# Patient Record
Sex: Female | Born: 1935
Health system: Southern US, Community
[De-identification: ages and names within clinical notes are randomized; demographics above are authoritative.]

## PROBLEM LIST (undated history)

## (undated) DIAGNOSIS — D649 Anemia, unspecified: Secondary | ICD-10-CM

## (undated) DIAGNOSIS — Z8701 Personal history of pneumonia (recurrent): Secondary | ICD-10-CM

## (undated) DIAGNOSIS — N189 Chronic kidney disease, unspecified: Secondary | ICD-10-CM

## (undated) DIAGNOSIS — I2699 Other pulmonary embolism without acute cor pulmonale: Secondary | ICD-10-CM

## (undated) DIAGNOSIS — E785 Hyperlipidemia, unspecified: Secondary | ICD-10-CM

## (undated) DIAGNOSIS — R351 Nocturia: Secondary | ICD-10-CM

## (undated) DIAGNOSIS — M549 Dorsalgia, unspecified: Secondary | ICD-10-CM

## (undated) DIAGNOSIS — Z9289 Personal history of other medical treatment: Secondary | ICD-10-CM

## (undated) DIAGNOSIS — M109 Gout, unspecified: Secondary | ICD-10-CM

## (undated) DIAGNOSIS — I10 Essential (primary) hypertension: Secondary | ICD-10-CM

## (undated) DIAGNOSIS — I2510 Atherosclerotic heart disease of native coronary artery without angina pectoris: Secondary | ICD-10-CM

## (undated) DIAGNOSIS — J449 Chronic obstructive pulmonary disease, unspecified: Secondary | ICD-10-CM

## (undated) DIAGNOSIS — M1990 Unspecified osteoarthritis, unspecified site: Secondary | ICD-10-CM

## (undated) DIAGNOSIS — I219 Acute myocardial infarction, unspecified: Secondary | ICD-10-CM

## (undated) DIAGNOSIS — G8929 Other chronic pain: Secondary | ICD-10-CM

## (undated) HISTORY — DX: Other pulmonary embolism without acute cor pulmonale: I26.99

## (undated) HISTORY — DX: Atherosclerotic heart disease of native coronary artery without angina pectoris: I25.10

## (undated) HISTORY — PX: BACK SURGERY: SHX140

## (undated) HISTORY — PX: OTHER SURGICAL HISTORY: SHX169

## (undated) HISTORY — DX: Personal history of pneumonia (recurrent): Z87.01

## (undated) HISTORY — DX: Anemia, unspecified: D64.9

## (undated) HISTORY — PX: APPENDECTOMY: SHX54

## (undated) HISTORY — DX: Unspecified osteoarthritis, unspecified site: M19.90

## (undated) HISTORY — DX: Hyperlipidemia, unspecified: E78.5

---

## 1963-10-12 HISTORY — PX: ABDOMINAL HYSTERECTOMY: SHX81

## 1995-10-12 DIAGNOSIS — I219 Acute myocardial infarction, unspecified: Secondary | ICD-10-CM

## 1995-10-12 HISTORY — DX: Acute myocardial infarction, unspecified: I21.9

## 1995-10-12 HISTORY — PX: CORONARY ANGIOPLASTY: SHX604

## 1998-08-22 ENCOUNTER — Encounter: Admission: RE | Admit: 1998-08-22 | Discharge: 1998-11-20 | Payer: Self-pay | Admitting: Orthopedic Surgery

## 1998-11-28 ENCOUNTER — Encounter: Payer: Self-pay | Admitting: Orthopedic Surgery

## 1998-11-28 ENCOUNTER — Ambulatory Visit (HOSPITAL_COMMUNITY): Admission: RE | Admit: 1998-11-28 | Discharge: 1998-11-28 | Payer: Self-pay | Admitting: Orthopedic Surgery

## 2003-02-25 ENCOUNTER — Encounter: Payer: Self-pay | Admitting: *Deleted

## 2003-02-25 ENCOUNTER — Encounter (HOSPITAL_COMMUNITY): Admission: RE | Admit: 2003-02-25 | Discharge: 2003-03-27 | Payer: Self-pay | Admitting: *Deleted

## 2004-10-11 HISTORY — PX: OTHER SURGICAL HISTORY: SHX169

## 2006-03-31 ENCOUNTER — Ambulatory Visit (HOSPITAL_COMMUNITY): Admission: RE | Admit: 2006-03-31 | Discharge: 2006-03-31 | Payer: Self-pay | Admitting: Family Medicine

## 2006-08-19 ENCOUNTER — Ambulatory Visit: Payer: Self-pay | Admitting: Cardiology

## 2006-08-30 ENCOUNTER — Ambulatory Visit: Payer: Self-pay | Admitting: Cardiology

## 2006-10-11 HISTORY — PX: OTHER SURGICAL HISTORY: SHX169

## 2006-10-20 ENCOUNTER — Ambulatory Visit: Payer: Self-pay | Admitting: Cardiology

## 2006-10-21 ENCOUNTER — Encounter: Payer: Self-pay | Admitting: Cardiology

## 2006-10-28 ENCOUNTER — Encounter: Payer: Self-pay | Admitting: Cardiology

## 2007-06-08 ENCOUNTER — Encounter: Payer: Self-pay | Admitting: Cardiology

## 2007-07-11 ENCOUNTER — Encounter (INDEPENDENT_AMBULATORY_CARE_PROVIDER_SITE_OTHER): Payer: Self-pay | Admitting: General Surgery

## 2007-07-11 ENCOUNTER — Ambulatory Visit (HOSPITAL_COMMUNITY): Admission: RE | Admit: 2007-07-11 | Discharge: 2007-07-11 | Payer: Self-pay | Admitting: General Surgery

## 2007-07-21 ENCOUNTER — Encounter: Admission: RE | Admit: 2007-07-21 | Discharge: 2007-07-21 | Payer: Self-pay | Admitting: General Surgery

## 2007-08-08 ENCOUNTER — Encounter: Admission: RE | Admit: 2007-08-08 | Discharge: 2007-08-08 | Payer: Self-pay | Admitting: General Surgery

## 2007-08-31 ENCOUNTER — Ambulatory Visit: Payer: Self-pay | Admitting: Cardiology

## 2007-09-18 ENCOUNTER — Encounter: Payer: Self-pay | Admitting: Physician Assistant

## 2007-09-18 ENCOUNTER — Ambulatory Visit: Payer: Self-pay | Admitting: Cardiology

## 2007-10-10 ENCOUNTER — Ambulatory Visit: Payer: Self-pay | Admitting: Cardiology

## 2007-10-12 HISTORY — PX: OTHER SURGICAL HISTORY: SHX169

## 2007-10-27 ENCOUNTER — Ambulatory Visit (HOSPITAL_COMMUNITY): Admission: RE | Admit: 2007-10-27 | Discharge: 2007-10-27 | Payer: Self-pay | Admitting: General Surgery

## 2007-11-23 ENCOUNTER — Encounter (INDEPENDENT_AMBULATORY_CARE_PROVIDER_SITE_OTHER): Payer: Self-pay | Admitting: General Surgery

## 2007-11-23 ENCOUNTER — Inpatient Hospital Stay (HOSPITAL_COMMUNITY): Admission: RE | Admit: 2007-11-23 | Discharge: 2007-11-27 | Payer: Self-pay | Admitting: General Surgery

## 2008-03-07 ENCOUNTER — Encounter: Admission: RE | Admit: 2008-03-07 | Discharge: 2008-03-07 | Payer: Self-pay | Admitting: Otolaryngology

## 2008-03-07 ENCOUNTER — Encounter: Payer: Self-pay | Admitting: Cardiology

## 2008-04-02 ENCOUNTER — Ambulatory Visit: Payer: Self-pay | Admitting: Cardiology

## 2008-08-10 ENCOUNTER — Encounter: Payer: Self-pay | Admitting: Cardiology

## 2008-08-11 ENCOUNTER — Encounter: Payer: Self-pay | Admitting: Cardiology

## 2008-08-12 ENCOUNTER — Encounter: Payer: Self-pay | Admitting: Cardiology

## 2008-10-31 ENCOUNTER — Ambulatory Visit: Payer: Self-pay | Admitting: Cardiology

## 2008-10-31 ENCOUNTER — Encounter: Payer: Self-pay | Admitting: Physician Assistant

## 2009-05-15 ENCOUNTER — Ambulatory Visit: Payer: Self-pay | Admitting: Cardiology

## 2009-06-24 DIAGNOSIS — E785 Hyperlipidemia, unspecified: Secondary | ICD-10-CM | POA: Insufficient documentation

## 2009-06-24 DIAGNOSIS — I2510 Atherosclerotic heart disease of native coronary artery without angina pectoris: Secondary | ICD-10-CM | POA: Insufficient documentation

## 2009-08-18 ENCOUNTER — Ambulatory Visit: Payer: Self-pay | Admitting: Oncology

## 2009-08-20 ENCOUNTER — Encounter: Payer: Self-pay | Admitting: Cardiology

## 2009-08-20 LAB — CBC & DIFF AND RETIC
BASO%: 0.2 % (ref 0.0–2.0)
HGB: 9 g/dL — ABNORMAL LOW (ref 11.6–15.9)
Immature Retic Fract: 7.9 % (ref 0.00–10.70)
MCH: 30.3 pg (ref 25.1–34.0)
MCV: 96.3 fL (ref 79.5–101.0)
MONO#: 0.8 10*3/uL (ref 0.1–0.9)
NEUT#: 6.9 10*3/uL — ABNORMAL HIGH (ref 1.5–6.5)
NEUT%: 70.2 % (ref 38.4–76.8)
Platelets: 183 10*3/uL (ref 145–400)
Retic %: 2.18 % — ABNORMAL HIGH (ref 0.50–1.50)
Retic Ct Abs: 64.75 10*3/uL (ref 18.30–72.70)
lymph#: 1.9 10*3/uL (ref 0.9–3.3)

## 2009-08-21 LAB — HAPTOGLOBIN: Haptoglobin: 73 mg/dL (ref 16–200)

## 2009-08-22 LAB — COMPREHENSIVE METABOLIC PANEL
ALT: 25 U/L (ref 0–35)
AST: 28 U/L (ref 0–37)
Alkaline Phosphatase: 115 U/L (ref 39–117)
CO2: 21 mEq/L (ref 19–32)
Sodium: 141 mEq/L (ref 135–145)
Total Bilirubin: 0.4 mg/dL (ref 0.3–1.2)
Total Protein: 8 g/dL (ref 6.0–8.3)

## 2009-08-22 LAB — SPEP & IFE WITH QIG
Alpha-1-Globulin: 6.4 % — ABNORMAL HIGH (ref 2.9–4.9)
Gamma Globulin: 33.6 % — ABNORMAL HIGH (ref 11.1–18.8)
IgM, Serum: 59 mg/dL — ABNORMAL LOW (ref 60–263)

## 2009-08-22 LAB — VITAMIN B12: Vitamin B-12: 309 pg/mL (ref 211–911)

## 2009-08-22 LAB — IRON AND TIBC
%SAT: 22 % (ref 20–55)
Iron: 55 ug/dL (ref 42–145)

## 2009-09-02 ENCOUNTER — Encounter: Payer: Self-pay | Admitting: Cardiology

## 2009-10-11 HISTORY — PX: OTHER SURGICAL HISTORY: SHX169

## 2009-11-03 ENCOUNTER — Ambulatory Visit: Payer: Self-pay | Admitting: Oncology

## 2009-11-03 ENCOUNTER — Encounter: Payer: Self-pay | Admitting: Cardiology

## 2009-11-03 ENCOUNTER — Encounter (HOSPITAL_COMMUNITY): Admission: RE | Admit: 2009-11-03 | Discharge: 2010-01-16 | Payer: Self-pay | Admitting: Cardiology

## 2009-11-12 ENCOUNTER — Encounter: Payer: Self-pay | Admitting: Cardiology

## 2009-11-12 LAB — COMPREHENSIVE METABOLIC PANEL
Albumin: 3.7 g/dL (ref 3.5–5.2)
BUN: 21 mg/dL (ref 6–23)
CO2: 23 mEq/L (ref 19–32)
Creatinine, Ser: 1.11 mg/dL (ref 0.40–1.20)
Glucose, Bld: 80 mg/dL (ref 70–99)
Sodium: 141 mEq/L (ref 135–145)
Total Bilirubin: 0.3 mg/dL (ref 0.3–1.2)

## 2009-11-12 LAB — CBC WITH DIFFERENTIAL/PLATELET
BASO%: 0.4 % (ref 0.0–2.0)
HGB: 10.4 g/dL — ABNORMAL LOW (ref 11.6–15.9)
MCH: 30.4 pg (ref 25.1–34.0)
MCHC: 32.9 g/dL (ref 31.5–36.0)
MCV: 92.4 fL (ref 79.5–101.0)
MONO#: 0.6 10*3/uL (ref 0.1–0.9)
NEUT#: 6.8 10*3/uL — ABNORMAL HIGH (ref 1.5–6.5)
WBC: 9.8 10*3/uL (ref 3.9–10.3)

## 2009-11-12 LAB — IRON AND TIBC
%SAT: 26 % (ref 20–55)
UIBC: 195 ug/dL

## 2009-11-12 LAB — FERRITIN: Ferritin: 476 ng/mL — ABNORMAL HIGH (ref 10–291)

## 2010-02-09 ENCOUNTER — Ambulatory Visit: Payer: Self-pay | Admitting: Surgery

## 2010-02-09 ENCOUNTER — Encounter: Payer: Self-pay | Admitting: Cardiology

## 2010-02-19 ENCOUNTER — Encounter: Payer: Self-pay | Admitting: Cardiology

## 2010-02-27 ENCOUNTER — Encounter: Payer: Self-pay | Admitting: Cardiology

## 2010-03-02 ENCOUNTER — Ambulatory Visit: Payer: Self-pay | Admitting: Oncology

## 2010-03-03 ENCOUNTER — Inpatient Hospital Stay (HOSPITAL_COMMUNITY): Admission: RE | Admit: 2010-03-03 | Discharge: 2010-03-06 | Payer: Self-pay | Admitting: Orthopaedic Surgery

## 2010-03-03 ENCOUNTER — Encounter (INDEPENDENT_AMBULATORY_CARE_PROVIDER_SITE_OTHER): Payer: Self-pay | Admitting: Orthopaedic Surgery

## 2010-03-03 ENCOUNTER — Encounter: Payer: Self-pay | Admitting: Cardiology

## 2010-03-04 ENCOUNTER — Encounter: Payer: Self-pay | Admitting: Cardiology

## 2010-03-05 ENCOUNTER — Encounter: Payer: Self-pay | Admitting: Cardiology

## 2010-03-06 ENCOUNTER — Encounter: Payer: Self-pay | Admitting: Cardiology

## 2010-06-26 ENCOUNTER — Ambulatory Visit: Payer: Self-pay | Admitting: Cardiology

## 2010-06-26 DIAGNOSIS — D649 Anemia, unspecified: Secondary | ICD-10-CM | POA: Insufficient documentation

## 2010-06-26 DIAGNOSIS — D472 Monoclonal gammopathy: Secondary | ICD-10-CM | POA: Insufficient documentation

## 2010-06-29 ENCOUNTER — Encounter: Payer: Self-pay | Admitting: Cardiology

## 2010-09-16 ENCOUNTER — Encounter
Admission: RE | Admit: 2010-09-16 | Discharge: 2010-09-16 | Payer: Self-pay | Source: Home / Self Care | Admitting: Cardiology

## 2010-09-30 ENCOUNTER — Encounter
Admission: RE | Admit: 2010-09-30 | Discharge: 2010-09-30 | Payer: Self-pay | Source: Home / Self Care | Attending: Cardiology | Admitting: Cardiology

## 2010-10-31 ENCOUNTER — Encounter: Payer: Self-pay | Admitting: Family Medicine

## 2010-11-10 NOTE — Letter (Signed)
Summary: Internal Correspondence/ [place]  Internal Correspondence/ [facility]   Imported By: Bartholomew Boards 11/24/2009 16:33:49  _____________________________________________________________________  External Attachment:    Type:   Image     Comment:   External Document

## 2010-11-10 NOTE — Letter (Signed)
Summary: Discharge Summary  Discharge Summary   Imported By: Bartholomew Boards 06/26/2010 10:09:29  _____________________________________________________________________  External Attachment:    Type:   Image     Comment:   External Document

## 2010-11-10 NOTE — Op Note (Signed)
Summary: Operative Report  Operative Report   Imported By: Bartholomew Boards 06/26/2010 10:06:14  _____________________________________________________________________  External Attachment:    Type:   Image     Comment:   External Document

## 2010-11-10 NOTE — Assessment & Plan Note (Signed)
Summary: 1 YR FU AUG REMINDER  Medications Added METOPROLOL SUCCINATE 50 MG XR24H-TAB (METOPROLOL SUCCINATE) Take 1 tablet by mouth once a day LISINOPRIL 40 MG TABS (LISINOPRIL) Take 1 tablet by mouth once a day PLAVIX 75 MG TABS (CLOPIDOGREL BISULFATE) Take 1 tablet by mouth once a day HYDROCHLOROTHIAZIDE 12.5 MG CAPS (HYDROCHLOROTHIAZIDE) Take 1 tablet by mouth once a day TRAMADOL HCL 50 MG TABS (TRAMADOL HCL) Take 1 tablet by mouth four times a day as needed      Allergies Added: NKDA  Visit Type:  Follow-up Primary [place]:  Dr. Montez Morita Lovett Calender)   History of Present Illness: the patient is a 77 year old African-American female with a history of recurrent lower extremity DVT, status post IVC filter and prior Coumadin therapy.  Recently Coumadin was discontinued due to a history of hematuria or melena.  She has a history of coronary artery disease and is status post stent placement several years ago.  She had a stress test in 2008 which was negative for ischemia with an ejection fraction of 40%.  Recently, she underwent knee replacement secondary to severe osteoarthritis.  The Cardiolite stress study was ordered she was found to have an inferolateral wall defect with hypokinesis which appear to be fixed and an ejection fraction of 48%.  The patient underwent the surgery without any cardiovascular complications.  Her hospital course was however complicated by acute renal failure.  She also apparently did not receive a GI workup.  She is also followed by hematology for full multifactorial anemia associated with a monoclonal protein.  She is felt to have monoclonal gammopathy of unknown significance although her anemia and recent renal insufficiency appears concerning for a plasma cell disorder.  Nevertheless she is followed by hematology.  From a cardiac standpoint she appears to be stable.  She denies any chest pain or heart failure symptoms.  She denies any presyncope or syncope.  She reports  no pain from her recent knee surgery.  The patient is chronically overweight.  Preventive Screening-Counseling & Management  Alcohol-Tobacco     Smoking Status: quit     Year Quit: 1990  Current Medications (verified): 1)  Metoprolol Succinate 50 Mg Xr24h-Tab (Metoprolol Succinate) .... Take 1 Tablet By Mouth Once A Day 2)  Crestor 10 Mg Tabs (Rosuvastatin Calcium) .... Take 1 Tablet By Mouth Once A Day 3)  Icaps Mv  Tabs (Multiple Vitamins-Minerals) .... Take 1 Tablet By Mouth Once A Day 4)  Multivitamins  Tabs (Multiple Vitamin) .... Take 1 Tablet By Mouth Once A Day 5)  Nitrostat 0.4 Mg Subl (Nitroglycerin) .... Use As Directed For Chest Pain 6)  Lisinopril 40 Mg Tabs (Lisinopril) .... Take 1 Tablet By Mouth Once A Day 7)  Plavix 75 Mg Tabs (Clopidogrel Bisulfate) .... Take 1 Tablet By Mouth Once A Day 8)  Hydrochlorothiazide 12.5 Mg Caps (Hydrochlorothiazide) .... Take 1 Tablet By Mouth Once A Day 9)  Tramadol Hcl 50 Mg Tabs (Tramadol Hcl) .... Take 1 Tablet By Mouth Four Times A Day As Needed  Allergies (verified): No Known Drug Allergies  Comments:  Nurse/Medical Assistant: The patient's medication bottles and allergies were reviewed with the patient and were updated in the Medication and Allergy Lists.  Past History:  Past Medical History: [id number] (ICD-272.4) CAD, NATIVE VESSEL (ICD-414.01) Status post anemia requiring transfusion. questionable multifactorial anemia but with monoclonal protein. Status post renal insufficiency, felt to be secondary to prerenal azotemia. History of left ventricular dysfunction History of recurrent pulmonary emboliand DVT., status-post IVC  filter.Coumadin discontinued. Cardiolite study 2008 negative for ischemia with an ejection fraction of 48%. Myoview 2011 inferolateral wall infarct/fixed defect ejection fraction 48% no ischemia severe osteoarthritis status post knee replacement 2011 history of hematuria and melena  Social  History: Smoking Status:  quit  Review of Systems       The patient complains of fatigue, joint pain, and leg swelling.  The patient denies malaise, fever, weight gain/loss, vision loss, decreased hearing, hoarseness, chest pain, palpitations, shortness of breath, prolonged cough, wheezing, sleep apnea, coughing up blood, abdominal pain, blood in stool, nausea, vomiting, diarrhea, heartburn, incontinence, blood in urine, muscle weakness, rash, skin lesions, headache, fainting, dizziness, depression, anxiety, enlarged lymph nodes, easy bruising or bleeding, and environmental allergies.    Vital Signs:  Patient profile:   77 year old female Height:      61 inches Weight:      213 pounds BMI:     40.39 Pulse rate:   67 / minute BP sitting:   133 / 84  (left arm) Cuff size:   large  Vitals Entered By: Georgina Peer (June 26, 2010 10:48 AM)  Nutrition Counseling: Patient's BMI is greater than 25 and therefore counseled on weight management options.  Physical Exam  Additional Exam:  General: Well-developed, well-nourished in no distress, overweight head: Normocephalic and atraumatic eyes PERRLA/EOMI intact, conjunctiva and lids normal nose: No deformity or lesions mouth normal dentition, normal posterior pharynx neck: Supple, no JVD.  No masses, thyromegaly or abnormal cervical nodes lungs: Normal breath sounds bilaterally without wheezing.  Normal percussion heart: regular rate and rhythm with normal S1 and S2, no S3 or S4.  PMI is normal.  No pathological murmurs abdomen: Normal bowel sounds, abdomen is soft and nontender without masses, organomegaly or hernias noted.  No hepatosplenomegaly musculoskeletal: Back normal, normal gait muscle strength and tone normal pulsus: Pulse is normal in all 4 extremities Extremities: swelling of both knees status post right knee surgery. neurologic: Alert and oriented x 3 skin: Intact without lesions or rashes cervical nodes: No significant  adenopathy psychologic: Normal affect    Impression & Recommendations:  Problem # 1:  CAD, NATIVE VESSEL (ICD-414.01) the patient denies any recurrent chest pain.  She status-post stent placement several years ago.  She had a recent Myoview which demonstrated fixed defect and an ejection fraction of 40% but no ischemia.  Continue medical therapy. Her updated medication list for this problem includes:    Metoprolol Succinate 50 Mg Xr24h-tab (Metoprolol succinate) .Marland Kitchen... Take 1 tablet by mouth once a day    Nitrostat 0.4 Mg Subl (Nitroglycerin) ..... Use as directed for chest pain    Lisinopril 40 Mg Tabs (Lisinopril) .Marland Kitchen... Take 1 tablet by mouth once a day    Plavix 75 Mg Tabs (Clopidogrel bisulfate) .Marland Kitchen... Take 1 tablet by mouth once a day  Orders: T-Basic Metabolic Panel (99991111) T-CBC No Diff [id number]) T-Ferritin [id number]) Alric Quan [id number]) T- * Misc. Laboratory test [phone])  Problem # 2:  ANEMIA (ICD-285.9) permit the patient was taken off Coumadin secondary to hematuria melena she has been started on Plavix although I will see a clear indication for this.  Will check a CBC, renal function ferritin and iron saturation. Orders: T-Basic Metabolic Panel (99991111) T-CBC No Diff [id number]) T-Ferritin [id number]) Alric Quan [id number]) T- * Misc. Laboratory test [phone])  Problem # 3:  HYPERLIPIDEMIA-MIXED (ICD-272.4) followed by the patient's primary care physician. Her updated medication list for this problem includes:    Crestor 10 Mg Tabs (Rosuvastatin  calcium) .Marland Kitchen... Take 1 tablet by mouth once a day  Problem # 4:  MONOCLONAL GAMMOPATHY (ICD-273.1)  Patient Instructions: 1)  Labs:  CBC, BMET, Ferritin, Iron, Iron Saturation 2)  Follow up in  6 months

## 2010-11-10 NOTE — Consult Note (Signed)
Summary: Consultation Report  Consultation Report   Imported By: Bartholomew Boards 06/26/2010 10:07:51  _____________________________________________________________________  External Attachment:    Type:   Image     Comment:   External Document

## 2010-12-17 ENCOUNTER — Encounter: Payer: Self-pay | Admitting: *Deleted

## 2010-12-28 LAB — BASIC METABOLIC PANEL
BUN: 29 mg/dL — ABNORMAL HIGH (ref 6–23)
BUN: 29 mg/dL — ABNORMAL HIGH (ref 6–23)
BUN: 34 mg/dL — ABNORMAL HIGH (ref 6–23)
CO2: 23 mEq/L (ref 19–32)
CO2: 23 mEq/L (ref 19–32)
CO2: 24 mEq/L (ref 19–32)
CO2: 24 mEq/L (ref 19–32)
Chloride: 105 mEq/L (ref 96–112)
Chloride: 106 mEq/L (ref 96–112)
Chloride: 107 mEq/L (ref 96–112)
Chloride: 107 mEq/L (ref 96–112)
Chloride: 108 mEq/L (ref 96–112)
Creatinine, Ser: 1.48 mg/dL — ABNORMAL HIGH (ref 0.4–1.2)
GFR calc Af Amer: 20 mL/min — ABNORMAL LOW (ref >60)
Glucose, Bld: 105 mg/dL — ABNORMAL HIGH (ref 70–99)
Glucose, Bld: 113 mg/dL — ABNORMAL HIGH (ref 70–99)
Glucose, Bld: 140 mg/dL — ABNORMAL HIGH (ref 70–99)
Potassium: 4.8 mEq/L (ref 3.5–5.1)
Potassium: 5.2 mEq/L — ABNORMAL HIGH (ref 3.5–5.1)
Potassium: 5.2 mEq/L — ABNORMAL HIGH (ref 3.5–5.1)
Sodium: 135 mEq/L (ref 135–145)
Sodium: 137 mEq/L (ref 135–145)

## 2010-12-28 LAB — COMPREHENSIVE METABOLIC PANEL
ALT: 19 U/L (ref 0–35)
AST: 24 U/L (ref 0–37)
Alkaline Phosphatase: 118 U/L — ABNORMAL HIGH (ref 39–117)
CO2: 25 mEq/L (ref 19–32)
Calcium: 8.9 mg/dL (ref 8.4–10.5)
GFR calc Af Amer: 52 mL/min — ABNORMAL LOW (ref >60)
GFR calc non Af Amer: 43 mL/min — ABNORMAL LOW (ref >60)
Glucose, Bld: 93 mg/dL (ref 70–99)
Potassium: 4.3 mEq/L (ref 3.5–5.1)
Sodium: 138 mEq/L (ref 135–145)
Total Protein: 7.5 g/dL (ref 6.0–8.3)

## 2010-12-28 LAB — URINALYSIS, ROUTINE W REFLEX MICROSCOPIC
Bilirubin Urine: NEGATIVE
Hgb urine dipstick: NEGATIVE
Ketones, ur: NEGATIVE mg/dL
Nitrite: NEGATIVE
Nitrite: NEGATIVE
Protein, ur: NEGATIVE mg/dL
Protein, ur: NEGATIVE mg/dL
Specific Gravity, Urine: 1.017 (ref 1.005–1.030)
Urobilinogen, UA: 0.2 mg/dL (ref 0.0–1.0)
Urobilinogen, UA: 0.2 mg/dL (ref 0.0–1.0)
pH: 5.5 (ref 5.0–8.0)

## 2010-12-28 LAB — URINE CULTURE: Colony Count: 30000

## 2010-12-28 LAB — CBC
HCT: 24.8 % — ABNORMAL LOW (ref 36.0–46.0)
HCT: 29.9 % — ABNORMAL LOW (ref 36.0–46.0)
Hemoglobin: 11 g/dL — ABNORMAL LOW (ref 12.0–15.0)
MCHC: 33.5 g/dL (ref 30.0–36.0)
MCHC: 33.7 g/dL (ref 30.0–36.0)
MCHC: 34 g/dL (ref 30.0–36.0)
MCHC: 34.2 g/dL (ref 30.0–36.0)
MCV: 93.9 fL (ref 78.0–100.0)
MCV: 94 fL (ref 78.0–100.0)
MCV: 95.6 fL (ref 78.0–100.0)
Platelets: 123 10*3/uL — ABNORMAL LOW (ref 150–400)
Platelets: 134 10*3/uL — ABNORMAL LOW (ref 150–400)
RBC: 2.59 MIL/uL — ABNORMAL LOW (ref 3.87–5.11)
RBC: 3.39 MIL/uL — ABNORMAL LOW (ref 3.87–5.11)
RDW: 15.2 % (ref 11.5–15.5)
WBC: 12 10*3/uL — ABNORMAL HIGH (ref 4.0–10.5)
WBC: 13.9 10*3/uL — ABNORMAL HIGH (ref 4.0–10.5)
WBC: 14.5 10*3/uL — ABNORMAL HIGH (ref 4.0–10.5)
WBC: 14.9 10*3/uL — ABNORMAL HIGH (ref 4.0–10.5)

## 2010-12-28 LAB — DIFFERENTIAL
Basophils Absolute: 0 10*3/uL (ref 0.0–0.1)
Basophils Relative: 0 % (ref 0–1)
Lymphocytes Relative: 24 % (ref 12–46)
Monocytes Absolute: 0.8 10*3/uL (ref 0.1–1.0)
Neutro Abs: 7.8 10*3/uL — ABNORMAL HIGH (ref 1.7–7.7)
Neutrophils Relative %: 65 % (ref 43–77)

## 2010-12-28 LAB — TYPE AND SCREEN
ABO/RH(D): O POS
Antibody Screen: NEGATIVE

## 2010-12-28 LAB — APTT: aPTT: 31 seconds (ref 24–37)

## 2010-12-28 LAB — PROTIME-INR
INR: 1.14 (ref 0.00–1.49)
Prothrombin Time: 31 seconds — ABNORMAL HIGH (ref 11.6–15.2)

## 2010-12-28 LAB — CULTURE, BLOOD (ROUTINE X 2)

## 2010-12-28 LAB — ABO/RH: ABO/RH(D): O POS

## 2011-01-05 ENCOUNTER — Ambulatory Visit (INDEPENDENT_AMBULATORY_CARE_PROVIDER_SITE_OTHER): Payer: PRIVATE HEALTH INSURANCE | Admitting: Cardiology

## 2011-01-05 ENCOUNTER — Encounter: Payer: Self-pay | Admitting: Cardiology

## 2011-01-05 VITALS — BP 121/82 | HR 83 | Ht 61.0 in | Wt 226.0 lb

## 2011-01-05 DIAGNOSIS — D472 Monoclonal gammopathy: Secondary | ICD-10-CM

## 2011-01-05 DIAGNOSIS — I10 Essential (primary) hypertension: Secondary | ICD-10-CM

## 2011-01-05 DIAGNOSIS — Z86711 Personal history of pulmonary embolism: Secondary | ICD-10-CM | POA: Insufficient documentation

## 2011-01-05 DIAGNOSIS — D649 Anemia, unspecified: Secondary | ICD-10-CM

## 2011-01-05 DIAGNOSIS — I2699 Other pulmonary embolism without acute cor pulmonale: Secondary | ICD-10-CM

## 2011-01-05 DIAGNOSIS — G589 Mononeuropathy, unspecified: Secondary | ICD-10-CM

## 2011-01-05 DIAGNOSIS — I2510 Atherosclerotic heart disease of native coronary artery without angina pectoris: Secondary | ICD-10-CM

## 2011-01-05 MED ORDER — METOPROLOL SUCCINATE ER 50 MG PO TB24: 50.0000 mg | ORAL_TABLET | Freq: Every day | ORAL | Status: DC

## 2011-01-05 NOTE — Assessment & Plan Note (Signed)
Coronary artery disease, status post stent placement several years ago patient not taking aspirin but on Plavix. Myoview 2011 and inferolateral wall infarct/fixed defect ejection fraction oriented percent no ischemia. Continue current medical therapy.

## 2011-01-05 NOTE — Assessment & Plan Note (Signed)
Patient had difficult control with Coumadin of her INR.  Plavix substituted as an alternative although this is not really indicated as I explained to her.  However I will not discontinue it given her prior history of stent placement and the fact that she's not taken aspirin.  In the future once approval has comes from the FDA she can use rivaroxaban for DVT prophylaxis.

## 2011-01-05 NOTE — Progress Notes (Signed)
HPI  the patient is a 77 year old African-American female with a history of recurrent lower extremity DVT, status post IVC filter and prior Coumadin therapy.  Recently Coumadin was discontinued due to a history of hematuria or melena.  She was taken off Coumadin in part because of bleeding but also in large part because her INR levels were difficult to control.  She was changed to Plavix although I told the patient that this does not provide any prevention against deep venous thrombosis.  She does not take aspirin.  She has a history of coronary artery disease and is status post stent placement several years ago.  She had a stress test in 2008 which was negative for ischemia with an ejection fraction of 40 The patient underwent knee surgery last year and a Cardiolite study was ordered in 2011.Marland Kitchen  She was found to have an inferolateral wall defect with hypokinesis which appear to be fixed and an ejection fraction of 40%.  The patient underwent the surgery without any cardiovascular complications.  Possible course however was complicated by acute renal failure.  She is followed by hematology for multifactorial anemia associated with monoclonal protein.  She has a monoclonal gammopathy of unknown significance although her anemia and recent renal insufficiency appear concerning for plasma cell disorder.  Several months ago she had iron studies done which showed anemia of chronic disease but no definite iron deficiency anemia.  I requested to have a renal function and CBC monitored by her primary care physician During her last office visit she was stable from a cardiovascular perspective. The patient reports no chest pain.  She reports no palpitations or syncope.  She has been stable from a cardiovascular perspective.  She does have chronic dyspnea however she attributes this to her weight and deconditioning.  She also has difficulty walking due to lower back pain which improves with sitting and pains that are  consistent with sciatica and spinal stenosis.  Apparently the patient needs a laminectomy.  No Known Allergies  Current Outpatient Prescriptions on File Prior to Visit  Medication Sig Dispense Refill  . clopidogrel (PLAVIX) 75 MG tablet Take 75 mg by mouth daily.        . hydrochlorothiazide (,MICROZIDE/HYDRODIURIL,) 12.5 MG capsule Take 12.5 mg by mouth daily.        Marland Kitchen lisinopril (PRINIVIL,ZESTRIL) 40 MG tablet Take 40 mg by mouth daily.        . Multiple Vitamin (MULTIVITAMIN) tablet Take 1 tablet by mouth daily.        . nitroGLYCERIN (NITROSTAT) 0.4 MG SL tablet Place 0.4 mg under the tongue every 5 (five) minutes as needed.        . rosuvastatin (CRESTOR) 10 MG tablet Take 10 mg by mouth daily.        Marland Kitchen DISCONTD: metoprolol (TOPROL-XL) 50 MG 24 hr tablet Take 50 mg by mouth daily.        Marland Kitchen DISCONTD: Multiple Vitamins-Minerals (ICAPS MV PO) Take one by mouth daily        . DISCONTD: traMADol (ULTRAM) 50 MG tablet Take 50 mg by mouth every 6 (six) hours as needed.          Past Medical History  Diagnosis Date  . Hyperlipidemia   . Coronary artery disease   . Anemia, unspecified     S/P TRANSFUSION,? MULTIFACTORIAL ANEMIA BUT WITH MONOCLONAL PROTEIN  . Renal insufficiency     S/P RENAL INSUFFICIENCY,FELT TO BE SECONDARY TO PRERENAL AZOTEMIA  . Left ventricular dysfunction   .  Pulmonary embolism     H/O RECURRENT PE AND DVT,S/P IVC FILTER. COUMADIN DISCONTINUED  . Osteoarthritis     SEVERE OA S/P KNEE REPLACEMENT 2011  . Hematuria     H/O  . Melena     H/O    Past Surgical History  Procedure Date  . Cardiolite 2008    CARDIOLITE STUDY NEGATIVE FOR ISCHEMIA WITH EF 48%  . Nm myoview ltd 2011    INTEROLATERAL WALL INFARCT/FIXED DEFECT EF 48% NO ISCHEMIA    No family history on file.  History   Social History  . Marital Status: Widowed    Spouse Name: N/A    Number of Children: N/A  . Years of Education: N/A   Occupational History  . Not on file.   Social  History Main Topics  . Smoking status: Former Smoker    Quit date: 10/11/1988  . Smokeless tobacco: Not on file  . Alcohol Use: No  . Drug Use: No  . Sexually Active: Not on file   Other Topics Concern  . Not on file   Social History Narrative  . No narrative on file   Review of systems:Pertinent positives as outlined above. The remainder of 18 .review of systems is otherwise within normal limits  PHYSICAL EXAM BP 121/82  Pulse 83  Ht 5\' 1"  (1.549 m)  Wt 226 lb (102.513 kg)  BMI 42.70 kg/m2  General: Well-developed, well-nourished in no distress Head: Normocephalic and atraumatic Eyes:PERRLA/EOMI intact, conjunctiva and lids normal Ears: No deformity or lesions Mouth:normal dentition, normal posterior pharynx Neck: Supple, no JVD.  No masses, thyromegaly or abnormal cervical nodes Lungs: Normal breath sounds bilaterally without wheezing.  Normal percussion Cardiac: regular rate and rhythm with normal S1 and S2, no S3 or S4.  PMI is normal.  No pathological murmurs Abdomen: Normal bowel sounds, abdomen is soft and nontender without masses, organomegaly or hernias noted.  No hepatosplenomegaly MSK: Back normal, normal gait muscle strength and tone normal Vascular: Pulse is normal in all 4 extremities Extremities: No peripheral pitting edema Neurologic: Alert and oriented x 3 Skin: Intact without lesions or rashes Lymphatics: No significant adenopathyPsychologic: Normal affect  ECG:  ASSESSMENT AND PLAN

## 2011-01-05 NOTE — Assessment & Plan Note (Signed)
back pain with spinal stenosis/foraminal narrowing: The patient needs a laminectomy.  She needs no additional cardiac workup.  She stable and she should be at low risk for complications.  I have recommended that she make an appointment with Dr. Glenna Fellows.  Plavix can be safely discontinued a week before surgery if patient is scheduled for this procedure.

## 2011-01-05 NOTE — Patient Instructions (Signed)
The current medical regimen is effective;  continue present plan and medications.  

## 2011-02-23 NOTE — Assessment & Plan Note (Signed)
[facility]                          [facility] OFFICE NOTE   Alison [name], Alison [name]                  MRN:          [id number]  DATE:04/02/2008                            DOB:          [birth year]    HISTORY OF PRESENT ILLNESS:  The patient is a 77 year old female with  history of coronary artery disease who underwent recent renal cyst  removal.  The patient has a history of DVT and underwent placement of a  prophylactic IVC filter prior to surgery.  The patient indeed had a  prior history of large bilateral subtle pulmonary embolism in January  2008.  Currently, denies any short of breath or chest pain.  She has  recovered well from her surgery.   MEDICATIONS:  1. Crestor 10 mg p.o. daily.  2. Lisinopril/hydrochlorothiazide 20/25 mg p.o. daily.  3. Aspirin 81 mg p.o. daily.  4. Toprol-XL 50 mg p.o. daily.  5. Propoxyphene/APAP.   PHYSICAL EXAMINATION:  VITAL SIGNS:  Blood pressure 132/78 and heart  rate 72.  GENERAL:  A well-nourished African-American female in no apparent  distress.  HEENT:  Pupils are equal.  Conjunctivae clear.  NECK:  Supple.  No carotid upstroke.  No carotid bruits.  LUNGS:  Clear breath sounds bilaterally.  HEART:  Regular rate and rhythm.  Normal S1 and S2.  ABDOMEN:  Soft.  EXTREMITIES:  No cyanosis, clubbing, or edema.  NEURO:  The patient is alert and oriented and grossly nonfocal.   PROBLEMS:  1. History of adrenal cystectomy.  2. History of recurrent deep venous thrombosis, status post      prophylactic inferior vena cava filter implantation.  3. Multivessel coronary artery disease.  4. Nonischemic Cardiolite.  5. Normal left ventricular function.  6. History of mild mitral regurgitation.  7. Remote tobacco use.  8. Obesity.   PLAN:  1. The patient's EKG was reviewed and is within normal limits.  There      are no acute changes.  2. From a cardiac perspective, she seems to be doing well.  We made  no      change in her      medical regimen.  3. The patient can follow up with [place] in 6 months.     Ernestine Mcmurray, MD,FACC  Electronically Signed    GED/MedQ  DD: 04/02/2008  DT: 04/03/2008  Job #: [id number]

## 2011-02-23 NOTE — Assessment & Plan Note (Signed)
OFFICE VISIT   LATICE, SHELLENBERGER  DOB:  03/03/1931                                       02/09/2010  CHART#:[id number]   REASON FOR VISIT:  Clearance for knee replacement.   HISTORY:  This is a 77 year old female with chronic right knee pain who  is being scheduled for a right knee replacement.  She has a history of  DVT x2, both of which resulted in a PE.  This was in 2008 and 2009.  She  has had an IVC filter placed, which remains in place.  She has been on  Coumadin in the past; however, this was forced to be stopped secondary  to a GI bleed.  She is now taking Plavix.  She comes in today for a  vascular clearance for her operation.   REVIEW OF SYSTEMS:  GENERAL:  Negative for fevers, chills weight gain,  weight loss.  CARDIAC:  Positive for shortness of breath with exertion.  PULMONARY:  Negative.  GI:  Negative, except for bleeding with Coumadin.  GU:  Negative.  VASCULAR:  Negative.  NEUROLOGIC:  Negative.  MUSCULOSKELETAL:  Positive for pain in her right knee.  PSYCH:  Negative.  ENT:  Right ear hearing loss.  HEMATOLOGIC:  Negative.  SKIN:  Negative.   PAST MEDICAL HISTORY:  Hypertension, history of an MI, history of blood  clot, right knee pain.   PAST SURGICAL HISTORY:  Coronary stents, adrenalectomy, partial  hysterectomy, removal of cyst in her back and her breasts.   FAMILY HISTORY:  Negative for cardiovascular disease at an early age.   SOCIAL HISTORY:  She is widowed with 6 children.  She is a retired Biomedical scientist.  Does not smoke.  Has a history of smoking.   PHYSICAL EXAMINATION:  Heart rate 79, blood pressure 140/83, O2  saturations are 99%.  General:  She is well-appearing, in no distress.  Cardiovascular:  Regular rate and rhythm.  Lungs are clear bilaterally.  Her abdomen is obese.  Musculoskeletal:  Without major deformities.  Skin is without rash.  Extremities:  She has a palpable right dorsalis  pedis pulse.  Minimal edema.   Neurologic:  No numbness and no motor  deficits.   DIAGNOSTIC STUDIES:  Ultrasound was performed and independently  reviewed.  This reveals the right leg to be free of venous thrombus.  There is evidence of chronic thrombus on the left in the common femoral,  superficial femoral and popliteal veins.  Ankle-brachial indices are  __________ on the right and 1.0 on the left.   ASSESSMENT/PLAN:  Preoperative clearance for orthopedic surgery.   PLAN:  I think the patient will do well with her upcoming operation.  She has very minimal atherosclerotic changes to the arteries in her  right leg.  She will tolerate tourniquet application without incident.  With regards to her DVT, this was in her left leg.  She has a filter in  place.  I do not think this will be an issue.  Please call if you have  any questions.     Eldridge Abrahams, MD  Electronically Signed   VWB/MEDQ  D:  02/09/2010  T:  02/10/2010  Job:  [id number]   cc:   Dr. Durward Fortes

## 2011-02-23 NOTE — Assessment & Plan Note (Signed)
[facility]                          [facility]   Alison [name], Alison [name]                  MRN:          [id number]  DATE:08/31/2007                            DOB:          29-Jun-1931    PRIMARY CARDIOLOGIST:  Dr. Terald Sleeper.   REASON FOR VISIT:  Preoperative clearance.   Ms. Volk returns to our clinic after a previous visit here in  November of last year, at which time she presented to establish with our  [facility].  She was previously followed by Dr. Mar Daring in our  [facility], and has longstanding history of coronary artery  disease, as previously outlined.   At the time of her last visit here, I referred her for a 2-D  echocardiogram for reassessment of left ventricular function.  This  showed preserved LVF (EF 55%) with mild mitral regurgitation.  I also  adjusted her medication regimen with resumption of beta blocker, which  she had been on in the past, at the low dose of 25 daily.  We also cut  back on her aspirin to 81 daily, and checked a fasting lipid profile  revealing excellent control with LDL of 55 and HDL of 63.   I am informed today by her that she was subsequently hospitalized here  at [facility] in January with complaint of shortness of breath, and was  subsequently diagnosed with pulmonary embolus and left popliteal vein  DVT.  A 2-D echo was done, reviewed by Dr. Domenic Polite, showing normal LVF  (55%/60%) with inferior/posterior akinesis, and trace mitral  regurgitation.  There was moderate RV enlargement with decreased  contraction, and evidence of increased right-sided pressures.  Of note,  patient did have an elevated D-dimer at that time, as well.   Patient was discharged on Coumadin, which apparently was stopped this  past August.  Of note, however, she apparently was not on aspirin over  that same time frame.  She has also since been taken off Toprol.   Clinically, she denies any interim  development of anterior chest  discomfort.  She continues to have chronic exertional dyspnea, which has  remained stable.  She is limited in her ambulation with severe knee  arthritis.   Patient was recently found to have a 10-inch cyst on one of her adrenal  glands, which was an incidental finding by Dr. Excell Seltzer as she  was undergoing further evaluation of a breast cyst.  The latter  apparently proved to benign, but patient is awaiting clearance to  undergo surgical resection of the adrenal cyst.  Surgery has been  planned for this coming January.   Electrocardiogram today reveals NSR at 93 BPM with normal axis, and  chronic incomplete RBB, no acute changes.   MEDICATIONS:  1. Crestor 10 daily.  2. Lisinopril/hydrochlorothiazide 20/25 daily.  3. Fexofenadine 180 daily.  4. Guaifenex.   PHYSICAL EXAMINATION:  Blood pressure 148/96.  Pulse 95, regular.  Weight 217.  GENERAL:  A 77 year old female, morbidly obese, sitting upright in no  distress.  HEENT:  Normocephalic and atraumatic.  NECK:  Palpable bilateral carotid pulses without bruits.  No JVD.  LUNGS:  Clear to auscultation in all fields.  HEART:  Regular rate and rhythm (S1 and S2).  No significant murmurs.  ABDOMEN:  Protuberant.  Nontender.  EXTREMITIES:  Intact distal pulses with trace pedal edema.  NEUROLOGIC:  No focal deficits.   IMPRESSION:  1. Multivessel coronary artery disease.      a.     Known 100% occluded CFX with left collateralization; 90%       proximal right coronary artery by cardiac catheterization, October       1997 [facility]).      b.     Ejection fraction 48%.      c.     Non-ischemic dobutamine stress Cardiolite; ejection fraction       45%, May 2004.      d.     Reported history of 2 prior myocardial infarctions in 1997;       status post placement of 3 stents, per patient.  2. Preserved left ventricular function.      a.     By 2-D echocardiogram, January 2008.  3. Mild mitral  regurgitation.  4. Dyslipidemia.  5. Hypertension.  6. Remote tobacco.  7. Obesity.  8. Chronic exertional dyspnea.  9. Status post pulmonary emboli.      a.     Bilateral saddle embolus (right greater than left), January       2008.      b.     Left lower extremity DVT.      c.     Coumadin treatment completed, August 2008.   PLAN:  1. Low-level adenosine stress Cardiolite for risk stratification.  If      this is negative for ischemia, then patient is cleared to proceed      with surgery in January 2009, as planned.  2. Check fasting lipid profile at that time for reassessment of lipid      status.  3. Resume low-dose aspirin and Toprol XL 25 daily, as previously      recommended.  Up titrate Toprol as blood pressure and basal heart      rate allow.  4. Schedule return clinic followup with myself and Dr. Dannielle Burn in 1      month for review of stress test results, and further      recommendations prior to proceeding with general surgery.      Gene Serpe, PA-C  Electronically Signed      Ernestine Mcmurray, MD,FACC  Electronically Signed   GS/MedQ  DD: 08/31/2007  DT: 09/01/2007  Job #: [id number]   cc:   Monico Blitz, MD  Darene Lamer. Hoxworth, M.D.

## 2011-02-23 NOTE — Assessment & Plan Note (Signed)
[facility]                          [facility]   ESBEIDY, PIGG                  MRN:          [id number]  DATE:10/31/2008                            DOB:          [birth year]    PRIMARY CARDIOLOGIST:  Ernestine Mcmurray, MD, [facility]   REASON FOR VISIT:  Scheduled followup.   Since last seen here in the clinic this past June, Ms. Alison [name] was  unfortunately hospitalized here at [facility], this past November, with  recurrent right lower extremity DVT.  Given her underlying renal  insufficiency, she was evaluated with a ventilation perfusion study  which yielded high probability for pulmonary embolism.  Of note, she was  not on Coumadin, which she had been on in the remote past, and this was  resumed during this admission.  However, she also is known to have had a  prophylactic IVC filter, placed in January 2009.   The patient has since been followed by Dr. Manuella Ghazi, including monitoring  and management of her Coumadin.  She is hemodynamically stable and does  not complain of any symptoms suggestive of unstable angina pectoris.  The patient also has previously documented multivessel coronary artery  disease.  Of note, we did not see the patient in consultation, during  this most recent hospitalization.  She also apparently did not have a  followup 2-D echocardiogram at that time, as well.   EKG today indicates NSR of 72 bpm, with no significant change since her  previous study.   CURRENT MEDICATIONS:  1. Crestor 10 daily.  2. Lisinopril/hydrochlorothiazide 20/25 daily.  3. Fexofenadine.  4. Toprol-XL 50 daily.  5. Coumadin 3 mg, as directed.   PHYSICAL EXAMINATION:  VITAL SIGNS:  Blood pressure 110/70, pulse 72,  regular, weight 210.  GENERAL:  A 77 year old female, moderately obese, sitting upright, no  distress.  HEENT:  Normocephalic, atraumatic.  NECK:  Palpable carotid pulses without bruits; no JVD.  LUNGS:  Clear to  auscultation in all fields.  HEART:  Regular rate and rhythm.  No significant murmurs.  No rubs.  ABDOMEN:  Protuberant, nontender.  EXTREMITIES:  1+ bilateral nonpitting edema.  NEUROLOGIC:  Alert and oriented.   IMPRESSION:  1. Recurrent pulmonary embolus.      a.     Recurrent right lower extremity DVT.      b.     Coumadin anticoagulation initiated.      c.     Status post IVC filter, January 2009.      d.     Status post bilateral saddle pulmonary embolus, June 2008,       treated with Coumadin.  2. Severe, multivessel CAD, treated medically.      a.     As previously outlined.      b.     Status post last cardiac catheterization in 1997.      c.     Nonischemic dobutamine stress Cardiolite; EF 45%, May 2004.      d.     Prior history of myocardial infarction, reportedly treated       with multiple stents.  3.  History of normal left ventricular function.  4. Status post anemia, requiring transfusion.  5. Status post renal insufficiency.  6. Dyslipidemia.  7. Remote tobacco.   PLAN:  1. Continue current medication regimen.  2. Schedule return clinic followup with myself and Dr. Dannielle Burn in 6      months.      Gene Serpe, PA-C  Electronically Signed      Ernestine Mcmurray, MD,FACC  Electronically Signed   GS/MedQ  DD: 10/31/2008  DT: 11/01/2008  Job #: [id number]

## 2011-02-23 NOTE — Op Note (Signed)
NAME:  MARLAYNE, RUTTEN           ACCOUNT NO.:  [id number]   MEDICAL RECORD NO.:  [id number]          PATIENT TYPE:  AMB   LOCATION:  SDS                          FACILITY:  [facility]   PHYSICIAN:  Marland Kitchen T. Hoxworth, M.D.DATE OF BIRTH:  Jul 24, 1931   DATE OF PROCEDURE:  07/11/2007  DATE OF DISCHARGE:                               OPERATIVE REPORT   POSTOPERATIVE DIAGNOSIS:  Abnormal mammogram/right breast mass.   SURGICAL PROCEDURES:  Needle-localized right breast biopsy.   SURGEON:  Darene Lamer. Hoxworth, M.D.   ANESTHESIA:  Local with IV sedation.   BRIEF HISTORY:  Ms. Lintz is a 77 year old black female with  multiple medical problems who on a recent screening mammogram was found  to have two abnormal densities in the right breast, one at 9 o'clock and  one at 2 o'clock.  Large core needle biopsies of both of these areas  were performed.  The 2 o'clock lesion showed a completely benign-  appearing papilloma, but the 9 o'clock position showed a sclerosing  papilloma with atypia.  Excision of the 9 o'clock lesion has been  recommend and accepted.  The nature of the procedure, risks of bleeding  and infection were discussed and understood.  Following successful  needle localization, the patient was brought to the operating room for  this procedure.   DESCRIPTION OF PROCEDURE:  The patient was brought to the operating room  and placed in the supine position on the operating table and IV sedation  was administered.  The right breast was sterilely prepped and draped.  Correct patient and procedure were verified.  A curvilinear incision was  made near the areolar border in the lateral right breast, and dissection  was carried down to the subcutaneous tissue.  When the subcu was  entered, there clearly was a palpable abnormality in the area of the tip  of the wire which was fairly superficial at the 9 o'clock position of  the breast.  Dissection was carried out laterally and the  wire brought  into the wound.  A generous specimen of breast tissue was then excised  around the shaft and tip of the wire including the entire palpable  abnormality.  Hemostasis was obtained with cautery.  The subcu was  closed with interrupted 4-0 Monocryl and the skin with running  subcuticular 4-0 Monocryl and Dermabond.  Sponge, needle, and instrument  counts were correct.   The patient was taken to recovery in good condition.      Darene Lamer. Hoxworth, M.D.  Electronically Signed     BTH/MEDQ  D:  07/11/2007  T:  07/11/2007  Job:  [id number]   cc:   Johnnette Gourd, M.D.  Monico Blitz, MD

## 2011-02-23 NOTE — Op Note (Signed)
NAME:  Alison [name], Alison [name]           ACCOUNT NO.:  [id number]   MEDICAL RECORD NO.:  [id number]          PATIENT TYPE:  INP   LOCATION:  [place]                         FACILITY:  [facility]   PHYSICIAN:  Marland Kitchen T. Hoxworth, M.D.DATE OF BIRTH:  25-Jun-1931   DATE OF PROCEDURE:  11/23/2007  DATE OF DISCHARGE:                               OPERATIVE REPORT   PREOPERATIVE DIAGNOSES:  Left adrenal cystic mass.   POSTOPERATIVE DIAGNOSES:  Left adrenal cystic mass.   SURGICAL PROCEDURES:  Left adrenalectomy.   SURGEON:  Dr. Excell Seltzer.   ASSISTANT:  Dr. Margot Chimes.   ANESTHESIA:  General.   BRIEF HISTORY:  Alison [name] is a 77 year old female who on CT scan  of the chest to rule out a pulmonary nodule seen on chest x-ray was  found to have a cystic mass in the left upper quadrant of the abdomen.  Subsequent CT scan of the abdomen and pelvis has revealed a very large  approximately 20 cm cystic and somewhat solid mass rising from the left  adrenal gland and completely filling the left side of the abdomen  displacing viscera.  On questioning, she has been having some fullness  and discomfort in this area.  Workup has shown no evidence of a  functioning adrenal tumor. The x-ray appearance was felt to be somewhat  worrisome for an adrenocortical carcinoma.  We have recommended  proceeding with left adrenalectomy and resection of the large cystic  mass.  The nature of the procedure, indications, risks of bleeding,  infection, and cardiorespiratory complications have been discussed and  understood.  She is now brought to the operating room for this  procedure.   DESCRIPTION OF OPERATION:  The patient was brought to the operating  room, placed in the supine position on the operating table and general  endotracheal anesthesia was induced.  The abdomen is widely sterilely  prepped and draped.  The patient received preoperative IV antibiotics.  She received Lovenox subcutaneously preoperatively.  The  patient has a  history of DVT and had a vena filter placed preoperatively.  Correct  patient and procedure were verified.  A left subcostal incision was used  and dissection carried down through the subcutaneous tissue, fascial and  muscle layers using cautery and the peritoneum entered under direct  vision.  There was an easily palpable large cystic mass essentially  filling the left side of the abdomen.  The omentum was mobilized,  dissecting it away from adhesions in the pelvis from previous  hysterectomy.  On lifting the omentum, the mass was pushing down through  the mesocolon and the transverse colon. The lesser sac was widely opened  dividing the lesser omentum with the harmonic scalpel which exposed the  anterior surface of the cystic mass.  The transverse colon was  extensively mobilized dividing the lesser sac and the splenic flexure  was completely taken down and mobilized and then the  colon and  mesocolon were able to be reflected inferiorly into the abdomen exposing  the anterior and inferior aspects of the cyst.  The cyst wall was  extremely thin and on mobilizing the mesocolon the cyst  was entered and  all the fluid was suctioned.  It was clear serous fluid up.  The cyst  was completely decompressed.  The cyst wall was then dissected along its  posterior, medial and lateral surfaces up toward the adrenal gland.  The  pancreas was identified and was retracted superiorly. Dissection was  carried down onto the left kidney and the cyst was dissected up out of  the retroperitoneum and off of the left kidney after dividing Gerota's  fascia. Finally the cyst was fully mobilized down to the left adrenal  gland which was identified.  There appeared to be a couple of small firm  areas in the adrenal but no large mass other than the cyst.  The  inferior aspect of the adrenal gland was dissected.  The renal vessels  and renal vein identified and protected.  The adrenal vein was   identified, dissected free and divided between two proximal and one  distal clip.  Following this, careful dissection was carried around the  medial and superior borders of the adrenal gland dividing small vessels  with clips or with the LigaSure and the gland was steadily mobilized.  The splenic vein was identified and splenic vessels retracted superiorly  and protected. The LigaSure was then used to dissect the adrenal up off  the superior pole of the kidney and from final lateral and superior  attachments and then the gland was removed intact with the cyst wall  attached.  The operative site was irrigated and complete hemostasis  assured. There was a defect in the thinned transverse mesocolon that was  closed with running 3-0 chromic.  The viscera were returned to their  anatomic position.  The wound was then closed in layers with running PDS  after infiltrated with Marcaine.  The skin was closed with staples.  Sponge, needle and instrument counts were correct. The patient taken to  recovery in good condition.      Darene Lamer. Hoxworth, M.D.  Electronically Signed     BTH/MEDQ  D:  11/23/2007  T:  11/24/2007  Job:  [id number]

## 2011-02-23 NOTE — Procedures (Signed)
DUPLEX DEEP VENOUS EXAM - LOWER EXTREMITY   INDICATION:  History of DVT, PE.   HISTORY:  Edema:  No.  Trauma/Surgery:  No.  Pain:  Yes.  PE:  Yes.  Previous DVT:  Yes.  Anticoagulants:  No.  Other:   DUPLEX EXAM:                CFV   SFV   PopV  PTV         GSV                R  L  R  L  R  L  R   L       R  L  Thrombosis    o  +  o  +  o  +  o   Not visualized    o     o  Spontaneous   +  d  +  d  +  o  +   Not visualized    +     +  Phasic        +  d  +  d  +  o  +   Not visualized    +     +  Augmentation  +  d  +  d  +  o  +   Not visualized    +     +  Compressible  +  p  +  p  +  o  +   Not visualized    +     +  Competent     +  d  +  d  +  o  +   Not visualized    +     +   Legend:  + - yes  o - no  p - partial  D - decreased   IMPRESSION:  1. The right lower extremity appears free of thrombus.  2. The left common femoral vein, superficial femoral vein, and      popliteal vein appear with chronic thrombus.  3. The left posterior tibial was not adequately visualized.    _____________________________  V. Leia Alf, MD   CB/MEDQ  D:  02/09/2010  T:  02/10/2010  Job:  [id number]

## 2011-02-23 NOTE — Assessment & Plan Note (Signed)
[facility]                          [facility]   Alison [name], Alison [name]                  MRN:          [id number]  DATE:10/10/2007                            DOB:          12-09-1930    PRIMARY CARDIOLOGIST:  Dr. Dannielle Burn.   PRIMARY CARE PHYSICIAN:  Dr. Monico Blitz.   REASON FOR VISIT:  Pre-op clearance.   HISTORY OF PRESENT ILLNESS:  Alison [name] is a 77 year old female  patient with a history of coronary disease, status post previous  myocardial infarction in 1997 who also recently had recurrent DVT with  bilateral saddle pulmonary emboli in January 2008.  She completed  Coumadin therapy in August 2008.  She had an incidental finding of a of  an adrenal cyst that was reported at 10 inches in diameter.  Dr.  Excell Seltzer in [place] is planning on excising her cyst.  She saw [name], PA-C September 08, 2007 for pre-op clearance.  Per his  recommendation, the patient underwent an adenosine Cardiolite study.  This revealed an EF 42%, large fixed mid basal inferolateral defect  associated mild hypokinesis consistent with prior myocardial infarction.  It was noted that the patient's EF was previously normal by  echocardiogram June 2008.  This was rated at 55-60%.  She was placed on  low-dose Toprol and aspirin when she was last seen and returns today for  followup.   Since being seen.  The patient is doing well.  She denies any chest  pain.  She does have some dyspnea on exertion that seems to be stable.  She describes class IIB to III symptoms.  She denies orthopnea, PND.  Denies any syncope.  The patient notes that Dr. Excell Seltzer plans on  placing an IVC filter in January 2009 and then proceeding with her  adrenal cystectomy in February.  The reason for the filter is apparently  recurrent DVT.   The patient does report a cough for the last several months.  This is a  cough with production of clear sputum.  She denies fevers,  chills.  She  denies hemoptysis.  She does have a history of acid reflux.  She notes  quite a bit of epigastric burning with certain types of meals.   CURRENT MEDICATIONS:  1. Crestor 10 mg daily.  2. Lisinopril/hydrochlorothiazide 12/25 mg daily.  3. Fexofenadine 180 mg daily.  4. Guaifenex.  5. Metoprolol 25 mg daily.  6. Aspirin 81 mg daily.  7. Nitroglycerin p.r.n. chest pain.   ALLERGIES:  No known drug allergies.   PHYSICAL EXAM:  She is a well-nourished well-developed female.  Blood pressure is 151/100 pulse 88, weight 215.6 pounds.  HEENT:  Normal without JVD.  CARDIAC:  Normal S1, S2.  Regular rate and rhythm.  LUNGS:  Clear to auscultation bilaterally.  ABDOMEN:  Nontender soft.  No edema.  NEUROLOGIC:  She is alert and oriented x3.  Cranial nerves grossly  intact.   IMPRESSION:  1. Adrenal cyst.  Needs cystectomy.  2. History of recurrent deep vein thrombosis.  Status post bilateral      saddle pulmonary embolus, right greater  than left June 2008.      Coumadin therapy completed.  Inferior vena cava filter planned      January 2009.  3. Multivessel coronary artery disease.  100% occlusion of the      circumflex with left collateralization.  90% proximal right      coronary artery stenosis by cardiac catheterization July 29, 1996.  History of myocardial infarction 1997 - history of placement      of 3 stents per the patient.  4. Recent nonischemic Cardiolite study December 2008.  5. Preserved left ventricular function by echocardiogram January 2008.  6. Mild mitral regurgitation.  7. Dyslipidemia.  8. Hypertension.  9. Remote history tobacco abuse.  10.Obesity.  11.Chronic exertional dyspnea.  New York Heart Association class IIB      to III symptoms.  12.Cough.  Rule out gastroesophageal reflux disease.   PLAN:  The patient presents to the office today for followup.  She had a  recent nonischemic Cardiolite study.  According to [facility] and AHA   guidelines, she requires no further cardiac workup prior to her non-  cardiac surgery.  She should be acceptable risk.  Beta-blocker therapy  will be continued.  We will increase her Toprol to 50 mg a day for  better blood pressure and heart rate control.  She will remain on  aspirin.  This will be held perioperatively according to Dr. Excell Seltzer.  Our service in [place] will be available in the perioperative period  as necessary.   The patient also reports a cough.  This is nonproductive.  She also has  symptoms of acid reflux.  She had previously been on antacid therapy  with some relief.  I have recommended Protonix 40 mg a day.  If her  cough is not clearing she should follow up Dr. Manuella Ghazi.  I discussed this  with her.   The patient will follow up in 3 months or sooner p.r.n.      Richardson Dopp, PA-C  Electronically Signed      Alison Mcmurray, MD,FACC  Electronically Signed   SW/MedQ  DD: 10/10/2007  DT: 10/10/2007  Job #: [id number]   cc:   Monico Blitz, MD  Darene Lamer. Hoxworth, M.D.

## 2011-02-23 NOTE — Assessment & Plan Note (Signed)
[facility] OFFICE NOTE   Alison [name], Alison [name]                  MRN:          [id number]  DATE:05/15/2009                            DOB:          1931-02-18    REFERRING PHYSICIAN:  Monico Blitz, MD   HISTORY OF PRESENT ILLNESS:  The patient is a very pleasant 77 year old  African American female with a history of recurrent lower extremity DVT  status post IVC filter and Coumadin therapy.  The patient also has a  known history of coronary artery disease and is status post stent  placement many years ago.  She had a nonischemic dobutamine stress  Cardiolite with an EF of 45% in 2004.  Subsequently, she also had a  stress Cardiolite study done in 2008 which showed an ejection fraction  of 42%.  There was large fixed mid to basal inferolateral defect  associated with hypokinesis.  This was consistent with prior myocardial  infarction.  The patient states that she is asymptomatic.  She has no  chest pain, orthopnea, or PND.  She has no palpitations or syncope.  She  also has had no recurrent DVT over the last 6 months.  The patient is planning to do total knee replacement in the near future.   MEDICATIONS:  1. Crestor 10 mg p.o. daily.  2. Lisinopril and hydrochlorothiazide 20/25 mg p.o. daily.  3. Fenofexadine 180 mg p.o. daily.  4. Toprol-XL 50 mg daily.  5. Warfarin as directed.  6. Cyclobenzaprine 10 mg p.o. nightly.   PHYSICAL EXAMINATION:  VITAL SIGNS:  Blood pressure 140/75, heart rate  75, weight 210 pounds.  GENERAL:  A well-nourished African American female in no apparent  distress.  HEENT:  Pupils are isocoric.  Conjunctivae are clear.  NECK:  Supple.  Normal carotid upstroke.  No carotid bruits.  LUNGS:  Clear breath sounds bilaterally.  HEART:  Regular rate and rhythm.  Normal S1 and S2.  No murmur, rubs or  gallops.  ABDOMEN:  Soft, nontender.  No rebound or guarding.  Good bowel  sounds.  EXTREMITIES:  No cyanosis, clubbing or edema.   PROBLEM LIST:  1. History of recurrent pulmonary emboli.      a.     Recurrent right lower extremity deep vein thrombosis.      b.     Coumadin anticoagulation.      c.     Status post inferior vena cava filter in January 2009.      d.     Status post bilateral subtle pulmonary embolism in June       2008.  2. Multivessel coronary artery disease, treated medically and stable.      a.     Status post catheterization in 1997.      b.     Nonischemic dobutamine stress Cardiolite, ejection fraction       45 in May 2004.      c.     Adenosine Cardiolite study in 2008 negative for ischemia,       but with scar in the inferior wall and ejection fraction of 42%.  d.     History of myocardial function treated with multiple stents.  3. History of left ventricular dysfunction, but normal by      echocardiography.  4. Status post anemia requiring transfusion.  5. Status post renal insufficiency.  6. Dyslipidemia.  7. Remote tobacco use.   PLAN:  1. At this point, we will not change the patient's medication.  She      has no evidence of heart failure.  She is already on an ACE      inhibitor in the setting of her decreased LV function.  2. The patient is due for a repeat Cardiolite study in 1 year or      earlier if she has symptoms.     Ernestine Mcmurray, MD,FACC  Electronically Signed    GED/MedQ  DD: 05/15/2009  DT: 05/15/2009  Job #: [id number]   cc:   Monico Blitz, MD

## 2011-02-26 NOTE — Discharge Summary (Signed)
NAME:  Alison [name], Alison [name]           ACCOUNT NO.:  [id number]   MEDICAL RECORD NO.:  [id number]          PATIENT TYPE:  INP   LOCATION:  [place]                         FACILITY:  [facility]   PHYSICIAN:  Marland Kitchen T. Hoxworth, M.D.DATE OF BIRTH:  [birth year]   DATE OF ADMISSION:  11/23/2007  DATE OF DISCHARGE:  11/27/2007                               DISCHARGE SUMMARY   DISCHARGE DIAGNOSIS:  Large cystic left adrenal adenoma.   SURGICAL PROCEDURES:  Open left adrenalectomy, November 23, 2007.   HISTORY OF PRESENT ILLNESS:  Ms. Alison [name] is a 77 year old female who  in November underwent a new localized right breast biopsy for abnormal  mammogram with final pathology revealing a sclerosing papilloma.  However, her preoperative chest x-ray revealed a questionable lung  nodule.  CT scan of the chest was therefore obtained, which showed the  nodule to be a benign calcification, but this revealed in the lower cuts  of her chest and the abdomen a cystic mass in the left upper quadrant.  For this reason, abdominal pelvic CT was obtained.  This shows a large,  mostly cystic mass in the left upper quadrant measuring 18 cm in  diameter, displacing viscera from the left upper quadrant.  It appears  to be arising from the left adrenal gland with some solid component  associated with the adrenal gland.  After discussion in the office, we  have elected to proceed with resection.  Urines were negative for  catecholamines, and cortisone and aldosterone levels have been normal.   PAST MEDICAL HISTORY:  She had an episode of DVT and pulmonary embolus  in January of this year, was now off anticoagulants.  Also, a history of  coronary disease, MI in 34.  She has stable hypertension, elevated  cholesterol for arthritis.   PAST SURGICAL HISTORY:  Past surgical history includes a hysterectomy  and breast excision.   MEDICATIONS:  Are lisinopril, hydrochlorothiazide one daily, Nitro-Quick  p.r.n., Crestor 10  daily, fexofenadine HCl 180 daily, Toprol 25 a day,  aspirin 81 mg day and Darvocet.   ALLERGIES:  NONE.   Social history and family history, review of systems see the detailed  H&P.   PERTINENT PHYSICAL EXAM:  GENERAL:  Elderly African-American female,  mildly overweight.  Abdomen was well-healed low midline incision.  There  is some subtle fullness in the left upper quadrant to deep palpation.   HOSPITAL COURSE:  The patient was admitted on the morning of her  procedure and underwent a resection of very large cystic mass in the  left upper quadrant, which was associated with the left adrenal gland  and total left adrenalectomy.  She tolerated procedure quite well.  Her  postoperative course was entirely benign.  She was observed overnight in  the step-down unit but then transferred to the floor on the second  postoperative day.  She has been on a full liquid diet which she  tolerated well, was able to be advanced steadily to a regular diet.  She  was felt ready for discharge on November 27, 2007.  Wound is healing  primarily.  She is ambulatory.  She  is on oral medications and  tolerating regular diet.  Final pathology revealed a benign adrenal  epithelial cyst, associated with adrenal adenoma.   FINAL DIAGNOSIS:  Adrenal adenoma and a cystic neoplasm.   FOLLOW-UP:  With me in my office in 1 week.      Darene Lamer. Hoxworth, M.D.  Electronically Signed     BTH/MEDQ  D:  12/11/2007  T:  12/11/2007  Job:  [id number]   cc:   Monico Blitz, MD  Fax: [phone]

## 2011-02-26 NOTE — Procedures (Signed)
   NAME:  Alison [name], Alison [name] NO.:  [id number]   MEDICAL RECORD NO.:  [id number]                   PATIENT TYPE:  PREC   LOCATION:                                       FACILITY:   PHYSICIAN:  Scarlett Presto, M.D.                DATE OF BIRTH:  04-Feb-1931   DATE OF PROCEDURE:  02/25/2003  DATE OF DISCHARGE:                                    STRESS TEST   DOBUTAMINE CARDIOLITE   INDICATION:  The patient is a 77 year old female with history of coronary  artery disease with a totally occluded circumflex and left-to-left  collaterals.  Her ejection fraction is 48% with posterior and inferior  akinesia.  She was recently seen in the office by Dr. Mar Daring and new EKG  changes were noted.  She did complain of occasional angina and infrequent  use of nitroglycerin.   BASELINE DATA:  EKG showed sinus rhythm at 61 beats per minute with inverted  T waves inferolaterally and no change from the EKG on Feb 19, 2003.  Resting  blood pressure was 152/92.   DESCRIPTION OF PROCEDURE:  Dobutamine was infused up to 30 mcg with the  addition of leg lifts.  Her target heart rate was achieved.  She reached a  maximum heart rate of 140 beats per minute which is 93% of predicted  maximum.  Her maximum blood pressure was 164/80.  Cardiolite was injected  after target heart rate was achieved.  EKG showed frequent ventricular  ectopy and no ischemic changes.   Final images and results are pending M.D. review.     Amy Nelida Gores, P.A. [facility]                     Scarlett Presto, M.D.    AB/MEDQ  D:  02/25/2003  T:  02/25/2003  Job:  [id number]

## 2011-02-26 NOTE — Assessment & Plan Note (Signed)
[facility]                            [facility] OFFICE NOTE   Alison Alison [name], Alison [name]                  MRN:          [id number]  DATE:08/19/2006                            DOB:          October 08, 1931    REASON FOR CONSULTATION:  The patient returns to re-establish with our  group.   Alison Alison [name] Alison [name] is a very pleasant 78 year old female with long-standing  history of coronary artery disease, previously followed by Dr. Jenell Milliner  in our [facility], who now presents here to establish with our clinic  here in [place].  The patient was last seen by [place] in May 2004, at which time  she continued to do well from a cardiac standpoint with no symptoms  suggestive of unstable angina pectoris.  She was referred for a stress test  and had a dobutamine stress Cardiolite which showed no definite ischemia; EF  45%.  Since that time the patient reports no interim development of any  exertional angina pectoris.  She does have some chronic stable exertional  dyspnea but no PND, orthopnea, or significant lower extremity edema.  She  does complain of feeling tired, however, but this also does not appear to  be new.  The patient has not smoked tobacco in 20 some odd years.   Electrocardiogram today reveals normal sinus rhythm at 84 BPM with normal  axis and nonspecific ST abnormalities.   ALLERGIES:  NO KNOWN DRUG ALLERGIES.   CURRENT MEDICATIONS:  1. Crestor 10 every day.  2. Lisinopril/HCTZ 20/25 mg every day.  3. Fexofenadine 180 mg every day.  4. Aspirin 325 every day.   PAST MEDICAL HISTORY:  1. Coronary artery disease.      a.     CFX 100% with left collateralization, 90% proximal RCA by       cardiac catheterization, October [facility]).      b.     EF 48%.      c.     Status post placement of 3 stents, per the patient.      d.     Reported history of two myocardial infarctions in 1997.  2. Hyperlipidemia.  3. Hypertension.  4. Remote  tobacco.   SOCIAL HISTORY:  The patient is married.  She has not smoked tobacco in over  20 years.  Denies alcohol use.   FAMILY HISTORY:  Noncontributory.   REVIEW OF SYSTEMS:  As noted per HPI.  Denies PND, orthopnea, or lower  extremity edema.  Denies exertional angina pectoris.  Remaining systems  negative.   PHYSICAL EXAMINATION:  VITAL SIGNS:  Blood pressure 138/90, pulse 84  regular, weight 191.  GENERAL:  A 77 year old female sitting upright in no apparent distress.  HEENT:  Normocephalic atraumatic.  NECK:  Palpable bilateral carotid pulses without bruits.  No JVD.  LUNGS:  Diminished breath sounds at bases but without crackles or wheezes.  HEART:  Regular rate and rhythm (S1 S2).  No significant murmurs.  No rubs  or gallops.  ABDOMEN:  Soft nontender with intact bowel sounds.  EXTREMITIES:  Palpable posterior tibialis pulses with 1+ pedal edema.  NEUROLOGIC:  No focal deficits.   IMPRESSION:  Alison Alison [name] Alison [name] is a delightful 77 year old female with known  coronary artery disease, status post remote myocardial infarction and  reportedly treated with placement of several stents in 1997 [facility]), who has  not had a cardiac catheterization since then but had a non-ischemic  dobutamine stress Cardiolite in 2004.  Ejection fraction at that time was  calculated at 45%.  The patient has not had any followup 2D echocardiogram  and in fact, has not been seen by [place] in clinic since May 2004.  The patient  presents with no clinical evidence suggestive of unstable angina pectoris or  congestive heart failure.   PLAN:  1. A 2D echocardiogram for reassessment of left ventricular function.  2. Resumption of beta-blocker with Toprol XL at the reduced dose of 25 mg      every day.  She used to be on 50 mg a day and we will up titrate this      if her blood pressure allows.  3. Check a fasting lipid, liver profile for reassessment of her lipid      status.  She will need aggressive  management with an LDL goal of 70 or      below.  4. Reduced aspirin to 81 mg every day.  5. Return clinic followup with myself in one month for review of her      echocardiogram results and further recommendations.      Gene Serpe, PA-C  Electronically Signed      Ernestine Mcmurray, MD,FACC  Electronically Signed   GS/MedQ  DD: 08/19/2006  DT: 08/19/2006  Job #: [id number]   cc:   Monico Blitz, MD

## 2011-02-26 NOTE — H&P (Signed)
NAME:  Alison [name], Alison [name]           ACCOUNT NO.:  [id number]   MEDICAL RECORD NO.:  [id number]          PATIENT TYPE:  INP   LOCATION:  [place]                         FACILITY:  [facility]   PHYSICIAN:  Marland Kitchen T. Hoxworth, M.D.DATE OF BIRTH:  12/01/1930   DATE OF ADMISSION:  11/23/2007  DATE OF DISCHARGE:  11/27/2007                              HISTORY & PHYSICAL   CHIEF COMPLAINT:  Adrenal mass.   HISTORY OF PRESENT ILLNESS:  Mr. Bonesteel is a 77 year old female who  in November underwent a breast biopsy for what proved to be a benign  sclerosing papilloma; however, her preoperative chest x-ray revealed a  questionable lung nodule.  A CT scan of the chest was obtained for this,  which ruled out any problem with a lung nodule, but on the lower cuts to  the abdomen showed a large cystic mass in the left upper quadrant.  Subsequent abdominal pelvic CT has shown a 15-cm mostly cystic but  partially solid mass in the left upper quadrant associated with the left  adrenal gland.  Metabolic workup was negative for any functioning  adenoma.  After discussion due to potential of a malignancy based on  radiographic appearance we elected to proceed with open left  adrenalectomy, and the patient was admitted for this procedure.   PAST MEDICAL HISTORY:  Medically significant for a history of DVT and  embolus in January of this year.  She is now off anticoagulants.  A  retrievable superior vena cava filter was placed preoperatively.  She  also has a history of  coronary artery disease, MI in 29.  She has  stable hypertension, elevated cholesterol and arthritis.   MEDICATIONS ON ADMISSION:  Lisinopril/hydrochlorothiazide 1 daily,  NitroQuick p.r.n., Crestor 10 daily, fexofenadine HCl 180 daily, Toprol  25 mg daily, aspirin 81 mg daily, Darvocet p.r.n.   ALLERGIES:  None.   SOCIAL HISTORY:  No cigarettes or alcohol.   FAMILY HISTORY:  Noncontributory.   PHYSICAL EXAMINATION:  VITAL SIGNS:   Afebrile.  Vital signs within  normal limits.  GENERAL:  She is a mildly overweight elderly African American female in  no acute distress.  SKIN:  Warm, dry.  No rash or infection.  HEENT:  No palpable mass or thyromegaly.  Sclerae nonicteric.  LUNGS:  Clear without wheezing or increased work of breathing.  CARDIAC:  Regular rate and rhythm.  No murmurs.  No edema.  BREASTS:  No masses.  Healed incision, right breast.  ABDOMEN:  Subtle fullness in the left upper quadrant but no definite  palpable mass, no tenderness.  EXTREMITIES:  No joint swelling or deformity.  NEUROLOGIC:  Alert and oriented.  Motor and sensory are grossly normal.   ASSESSMENT/PLAN:  Large cystic and solid left adrenal mass, adenoma  versus adrenocortical carcinoma.  The patient is admitted for  adrenalectomy.      Darene Lamer. Hoxworth, M.D.  Electronically Signed     BTH/MEDQ  D:  12/26/2007  T:  12/26/2007  Job:  [id number]

## 2011-03-12 ENCOUNTER — Telehealth: Payer: Self-pay | Admitting: *Deleted

## 2011-03-12 NOTE — Telephone Encounter (Signed)
States pt is there for pre-op.  Having surgery on June 11 by Dr. Carloyn Manner.  Needs to know when pt should d/c her Plavix.

## 2011-03-15 NOTE — Telephone Encounter (Signed)
Patient can discontinue Plavix one week prior to surgery. She can resume this after she gets clearance from her surgeon.

## 2011-03-16 NOTE — Telephone Encounter (Signed)
Patient notified

## 2011-07-01 LAB — CBC
MCHC: 33.3
RDW: 16.7 — ABNORMAL HIGH

## 2011-07-01 LAB — BASIC METABOLIC PANEL
BUN: 22
CO2: 25
Calcium: 8.3 — ABNORMAL LOW
Creatinine, Ser: 1
Glucose, Bld: 92

## 2011-07-01 LAB — PROTIME-INR
INR: 1
Prothrombin Time: 13.6

## 2011-07-02 LAB — URINALYSIS, ROUTINE W REFLEX MICROSCOPIC
Glucose, UA: NEGATIVE
Hgb urine dipstick: NEGATIVE
Ketones, ur: NEGATIVE
Protein, ur: NEGATIVE

## 2011-07-02 LAB — CBC
HCT: 31.5 — ABNORMAL LOW
HCT: 32.1 — ABNORMAL LOW
HCT: 28.7 — ABNORMAL LOW
Hemoglobin: 10.5 — ABNORMAL LOW
Hemoglobin: 9.7 — ABNORMAL LOW
MCHC: 33.7
MCV: 86.3
MCV: 85.4
Platelets: 219
RBC: 3.69 — ABNORMAL LOW
RBC: 3.37 — ABNORMAL LOW
RDW: 16.2 — ABNORMAL HIGH
RDW: 16.3 — ABNORMAL HIGH
RDW: 15.8 — ABNORMAL HIGH
WBC: 15.5 — ABNORMAL HIGH

## 2011-07-02 LAB — BASIC METABOLIC PANEL
Calcium: 8.1 — ABNORMAL LOW
GFR calc Af Amer: >60
GFR calc non Af Amer: 58 — ABNORMAL LOW
Glucose, Bld: 110 — ABNORMAL HIGH
Potassium: 4.5
Sodium: 137

## 2011-07-02 LAB — DIFFERENTIAL
Basophils Absolute: 0
Lymphocytes Relative: 20
Monocytes Absolute: 0.7
Neutro Abs: 8.8 — ABNORMAL HIGH
Neutrophils Relative %: 72

## 2011-07-02 LAB — TYPE AND SCREEN: ABO/RH(D): O POS

## 2011-07-02 LAB — COMPREHENSIVE METABOLIC PANEL
Albumin: 3 — ABNORMAL LOW
BUN: 22
Chloride: 106
Creatinine, Ser: 1.02
Glucose, Bld: 97
Total Bilirubin: 0.5
Total Protein: 6.6

## 2011-07-22 LAB — URINE MICROSCOPIC-ADD ON

## 2011-07-22 LAB — URINALYSIS, ROUTINE W REFLEX MICROSCOPIC
Glucose, UA: NEGATIVE
Hgb urine dipstick: NEGATIVE
pH: 6.5

## 2011-07-22 LAB — DIFFERENTIAL
Eosinophils Absolute: 0.2
Eosinophils Relative: 2
Lymphocytes Relative: 23
Lymphs Abs: 2.2
Monocytes Relative: 7
Neutrophils Relative %: 68

## 2011-07-22 LAB — COMPREHENSIVE METABOLIC PANEL
ALT: 20
AST: 25
CO2: 30
Calcium: 8.8
Creatinine, Ser: 0.94
GFR calc Af Amer: >60
GFR calc non Af Amer: 58 — ABNORMAL LOW
Sodium: 142
Total Protein: 6.8

## 2011-07-22 LAB — CBC
MCHC: 32.2
MCV: 85.3
RDW: 15.8 — ABNORMAL HIGH

## 2011-08-04 ENCOUNTER — Encounter: Payer: Self-pay | Admitting: Cardiology

## 2011-08-04 ENCOUNTER — Ambulatory Visit (INDEPENDENT_AMBULATORY_CARE_PROVIDER_SITE_OTHER): Payer: PRIVATE HEALTH INSURANCE | Admitting: Cardiology

## 2011-08-04 VITALS — BP 120/78 | HR 69 | Ht 61.0 in | Wt 226.0 lb

## 2011-08-04 DIAGNOSIS — I2699 Other pulmonary embolism without acute cor pulmonale: Secondary | ICD-10-CM

## 2011-08-04 DIAGNOSIS — D472 Monoclonal gammopathy: Secondary | ICD-10-CM

## 2011-08-04 DIAGNOSIS — I2510 Atherosclerotic heart disease of native coronary artery without angina pectoris: Secondary | ICD-10-CM

## 2011-08-04 NOTE — Assessment & Plan Note (Signed)
Patient declines Coumadin. She states that she had a very bad experience previously and her INR could not get regulated. However I told her that she could come to the Coumadin clinic here and that we would like to do much better job in regulating her Coumadin and keeping her in [place]. However the patient states that she wants to continue Plavix although she understands that is not giving adequate protection against DVT.

## 2011-08-04 NOTE — Assessment & Plan Note (Signed)
Medically stable. The patient reports no chest pain. Continue risk factor modification.

## 2011-08-04 NOTE — Assessment & Plan Note (Signed)
I asked the patient to followup with hematology/oncology to make sure that she will not develop a plasma cell dyscrasia in the future.

## 2011-08-04 NOTE — Patient Instructions (Signed)
Your physician wants you to follow-up in: 6 months. You will receive a reminder letter in the mail one-two months in advance. If you don't receive a letter, please call our office to schedule the follow-up appointment. Your physician recommends that you continue on your current medications as directed. Please refer to the Current Medication list given to you today. 

## 2011-08-04 NOTE — Progress Notes (Signed)
History of present illness:  the patient is a 77 year old African-American female with a history of recurrent lower extremity DVT, status post IVC filter and prior Coumadin therapy.  Recently Coumadin was discontinued due to a history of hematuria or melena.  She has a history of coronary artery disease and is status post stent placement several years ago.  She had a stress test in 2008 which was negative for ischemia with an ejection fraction of 40%.  Recently, she underwent knee replacement secondary to severe osteoarthritis.  The Cardiolite stress study was ordered she was found to have an inferolateral wall defect with hypokinesis which appear to be fixed and an ejection fraction of 48%.  The patient underwent the surgery without any cardiovascular complications.  Her hospital course was however complicated by acute renal failure.  She also apparently did not receive a GI workup.  She is also followed by hematology for full multifactorial anemia associated with a monoclonal protein.  She is felt to have monoclonal gammopathy of unknown significance although her anemia and recent renal insufficiency appears concerning for a plasma cell disorder.  However, she has not been seen by total hematology in quite some time. The patient again is not willing to go back on Coumadin as she states that her Coumadin levels were made to erratic previously with her primary care physician. From a cardiovascular standpoint is otherwise doing well.   Allergies, family history and social history: As documented in chart and reviewed  Medications: Documented and reviewed in chart.  Past medical history: Reviewed and see problem list below   Review of systems: No nausea or vomiting no fever or chills no melena hematochezia. No dysuria frequency no palpitations or syncope chronic back pain. No bleeding   Physical examination : Vital signs documented below General: Overweight African American female in no apparent  distress HEENT: EOMI, PERRLA, normal carotid upstroke and no carotid bruits Lungs: Clear breath sounds bilaterally without any wheezing. Heart: Regular rate and rhythm with normal S1-S2. Abdomen: Soft nontender with no rebound or guarding and good bowel sounds. Extremity exam: No cyanosis clubbing or edema Neurologic: Alert and oriented and grossly nonfocal Psychiatric: Normal affect Vascular exam: Normal peripheral pulses bilaterally.

## 2011-10-13 ENCOUNTER — Other Ambulatory Visit: Payer: Self-pay | Admitting: Cardiology

## 2012-03-23 ENCOUNTER — Encounter: Payer: Self-pay | Admitting: Cardiology

## 2012-06-25 ENCOUNTER — Other Ambulatory Visit: Payer: Self-pay | Admitting: Cardiology

## 2012-08-11 ENCOUNTER — Telehealth: Payer: Self-pay | Admitting: Oncology

## 2012-08-11 NOTE — Telephone Encounter (Signed)
gv [place]iffany information for this pt....the patient had not been here since 2011.Marland KitchenMarland Kitchen

## 2012-08-19 ENCOUNTER — Other Ambulatory Visit: Payer: Self-pay | Admitting: Physician Assistant

## 2012-09-24 ENCOUNTER — Encounter: Payer: Self-pay | Admitting: Cardiology

## 2012-09-25 ENCOUNTER — Encounter: Payer: Self-pay | Admitting: Cardiology

## 2012-09-25 ENCOUNTER — Ambulatory Visit (INDEPENDENT_AMBULATORY_CARE_PROVIDER_SITE_OTHER): Payer: PRIVATE HEALTH INSURANCE | Admitting: Cardiology

## 2012-09-25 VITALS — BP 134/72 | HR 72 | Ht 61.0 in | Wt 245.0 lb

## 2012-09-25 DIAGNOSIS — E785 Hyperlipidemia, unspecified: Secondary | ICD-10-CM

## 2012-09-25 DIAGNOSIS — Z86711 Personal history of pulmonary embolism: Secondary | ICD-10-CM

## 2012-09-25 DIAGNOSIS — I2510 Atherosclerotic heart disease of native coronary artery without angina pectoris: Secondary | ICD-10-CM

## 2012-09-25 NOTE — Assessment & Plan Note (Signed)
Will request most recent lab work from Dr. Berdine Addison. She continues on Crestor.

## 2012-09-25 NOTE — Assessment & Plan Note (Signed)
Symptomatically stable on medical therapy. Cardiolite from 2011 reviewed. ECG reviewed. For now continue observation.

## 2012-09-25 NOTE — Progress Notes (Signed)
Clinical Summary Ms. Engh is a 78 y.o.female presenting for followup. She is a former patient of Dr. Dannielle Burn, prefers to stay with [facility]. She was last seen in the office in October 2012.  Records were reviewed. Last ischemic testing was in January 2011 demonstrating inferior and inferolateral scar, LVEF 48%. She has prior history of pulmonary embolus and DVT status post IVC filter, ultimately stopped Coumadin due to fluctuating INR levels with followup per primary care. She did not want to resume Coumadin even with followup through our clinic last discussion with Dr. Dannielle Burn.  ECG today shows sinus rhythm with left anterior fascicular block and nonspecific ST changes. She reports no significant angina symptoms, no nitroglycerin use. Denies any significant bleeding problems, has tolerated Plavix. States that she sees Dr. Berdine Addison approximately every 3 months, has had lab work this year.   No Known Allergies  Current Outpatient Prescriptions  Medication Sig Dispense Refill  . ADVAIR DISKUS 250-50 MCG/DOSE AEPB Inhale 1 Inhaler into the lungs Twice daily.      . clopidogrel (PLAVIX) 75 MG tablet Take 75 mg by mouth daily.        . ferrous sulfate 325 (65 FE) MG tablet Take one by mouth twice daily        . hydrochlorothiazide (,MICROZIDE/HYDRODIURIL,) 12.5 MG capsule Take 12.5 mg by mouth daily.        Marland Kitchen lisinopril (PRINIVIL,ZESTRIL) 40 MG tablet Take 40 mg by mouth daily.        . metoprolol succinate (TOPROL-XL) 50 MG 24 hr tablet TAKE 1 TABLET BY MOUTH EVERY DAY **CALL OFFICE FOR APPOINTMENT**  30 tablet  0  . nitroGLYCERIN (NITROSTAT) 0.4 MG SL tablet Place 0.4 mg under the tongue every 5 (five) minutes as needed.        . rosuvastatin (CRESTOR) 10 MG tablet Take 10 mg by mouth daily.          Past Medical History  Diagnosis Date  . Hyperlipidemia   . Coronary atherosclerosis of native coronary artery     Occluded circ with collaterals and 90% RCA (previous stents at [facility], details  not clear), LVEF 48% 1/11 - inferior and inferolateral scar by Myoview 1/11,   . Monoclonal gammopathy of undetermined significance   . Renal insufficiency   . Pulmonary embolism     History of recurrent DVT and PE status post IVC filter, previously on Coumadin  . Osteoarthritis   . History of hematuria   . Chronic anemia     Social History Ms. Langner reports that she quit smoking about 23 years ago. Her smoking use included Cigarettes. She has a 2.4 pack-year smoking history. She has never used smokeless tobacco. Ms. Hysell reports that she does not drink alcohol.  Review of Systems No palpitations or dizziness. No orthopnea or PND. Has chronic back pain that limits her ambulation. No falls. Otherwise negative.  Physical Examination Filed Vitals:   09/25/12 1504  BP: 134/72  Pulse: 72   Filed Weights   09/25/12 1504  Weight: 245 lb (111.131 kg)   No acute distress. HEENT: Conjunctiva and lids normal, oropharynx clear. Neck: Supple, no elevated JVP or carotid bruits, no thyromegaly. Lungs: Clear to auscultation, nonlabored breathing at rest. Cardiac: Regular rate and rhythm, no S3, soft systolic murmur, no pericardial rub. Abdomen: Soft, nontender, bowel sounds present, no guarding or rebound. Extremities: No pitting edema, distal pulses 2+. Skin: Warm and dry. Musculoskeletal: No kyphosis. Neuropsychiatric: Alert and oriented x3, affect grossly appropriate.  Problem List and Plan   CAD, NATIVE VESSEL Symptomatically stable on medical therapy. Cardiolite from 2011 reviewed. ECG reviewed. For now continue observation.  HYPERLIPIDEMIA-MIXED Will request most recent lab work from Dr. Berdine Addison. She continues on Crestor.  History of pulmonary embolus (PE) Previous history of PE and DVT status post IVC filter. She no longer takes Coumadin, previously followed by primary care.    Satira Sark, M.D., F.A.C.C.

## 2012-09-25 NOTE — Assessment & Plan Note (Signed)
Previous history of PE and DVT status post IVC filter. She no longer takes Coumadin, previously followed by primary care.

## 2012-09-25 NOTE — Patient Instructions (Addendum)

## 2012-10-20 ENCOUNTER — Other Ambulatory Visit: Payer: Self-pay | Admitting: Physician Assistant

## 2013-02-28 ENCOUNTER — Telehealth: Payer: Self-pay | Admitting: Cardiology

## 2013-02-28 DIAGNOSIS — R609 Edema, unspecified: Secondary | ICD-10-CM

## 2013-02-28 NOTE — Telephone Encounter (Signed)
RIGHT ankle swelling, Started Sunday with swelling. Mothers day it was her lip that was swollen

## 2013-03-01 NOTE — Telephone Encounter (Signed)
Right ankle/foot area swelling since Sunday (4 days).  No c/o any other symptoms.  Did call Dr. Berdine Addison yesterday & he advised stopping Lisinopril and starting Benadryl due to lip swelling.  No swelling of lip / breathing difficulty since then.  Went away on same day.  OV scheduled for 6/27 with Dr. Domenic Polite.

## 2013-03-01 NOTE — Telephone Encounter (Signed)
Cant go tomorrow, family on vacation

## 2013-03-01 NOTE — Telephone Encounter (Signed)
If right ankle and foot swelling is the main symptom at this point, suggest lower extremity venous Dopplers to exclude DVT. She has a history of DVT and PE status post IVC filter, no longer on Coumadin.

## 2013-03-02 NOTE — Telephone Encounter (Signed)
Discussed below with daughter Mikle Bosworth).  Stated that her mom did not fully understand and could not hear well on the phone.  States she is available today & would like to keep her out of ED if possible.   Call placed to [facility] in Cardiovascular at [facility] & he advised patient could go ahead and come now.   Call also place to [place] in insurance - no precert needed.  Sherren Kerns notified of above.

## 2013-03-02 NOTE — Telephone Encounter (Signed)
Patient's daughter called back they are not  Going out of town. Asked the test be scheduled that her mom's foot is still swelling.

## 2013-03-13 ENCOUNTER — Telehealth: Payer: Self-pay | Admitting: *Deleted

## 2013-03-13 NOTE — Telephone Encounter (Signed)
Patient informed. 

## 2013-03-13 NOTE — Telephone Encounter (Signed)
Message copied by Merlene Laughter on Tue Mar 13, 2013  9:35 AM ------      Message from: MCDOWELL, Aloha Gell      Created: Mon Mar 12, 2013  4:05 PM       Reviewed report, negative for DVT on the right. ------

## 2013-04-06 ENCOUNTER — Encounter: Payer: Self-pay | Admitting: Cardiology

## 2013-04-06 ENCOUNTER — Ambulatory Visit (INDEPENDENT_AMBULATORY_CARE_PROVIDER_SITE_OTHER): Payer: Medicare Other | Admitting: Cardiology

## 2013-04-06 VITALS — BP 149/77 | HR 91 | Ht 61.0 in | Wt 249.0 lb

## 2013-04-06 DIAGNOSIS — E785 Hyperlipidemia, unspecified: Secondary | ICD-10-CM

## 2013-04-06 DIAGNOSIS — I2510 Atherosclerotic heart disease of native coronary artery without angina pectoris: Secondary | ICD-10-CM

## 2013-04-06 DIAGNOSIS — R609 Edema, unspecified: Secondary | ICD-10-CM

## 2013-04-06 DIAGNOSIS — R600 Localized edema: Secondary | ICD-10-CM | POA: Insufficient documentation

## 2013-04-06 MED ORDER — HYDROCHLOROTHIAZIDE 25 MG PO TABS: 25.0000 mg | ORAL_TABLET | Freq: Every day | ORAL | Status: DC

## 2013-04-06 NOTE — Assessment & Plan Note (Signed)
No active angina symptoms on medical therapy. Continue observation.

## 2013-04-06 NOTE — Patient Instructions (Addendum)
Your physician recommends that you schedule a follow-up appointment in: 6 months. You will receive a reminder letter in the mail in about 4 months reminding you to call and schedule your appointment. If you don't receive this letter, please contact our office. Your physician has recommended you make the following change in your medication: Start hydrochlorothiazide 25 mg daily. Your new prescription has been sent to your pharmacy. All other medications will remain the same. Your physician recommends that you return for lab work in: 2 weeks around April 20, 2013 at [facility].

## 2013-04-06 NOTE — Progress Notes (Signed)
Clinical Summary Ms. Biehl is an 79 y.o.female last seen in December 2013. She is here with her daughter.  Interval telephone notes reviewed. She had negative right lower extremity venous Dopplers for  DVT. She does have a prior history of DVT and PE status post IVC filter, no longer on Coumadin. She has still had some swelling around her ankles, right worse than left. She has been off lisinopril. Also concerned about intermittent muscle cramps, questioning whether Crestor may be related.  Lab work from June of last year showed potassium 5.1, BUN 28, creatinine 1.3.  We discussed her medications and the addition of a diuretic for both antihypertensive effect and also perhaps lessening of her edema.  No Known Allergies  Current Outpatient Prescriptions  Medication Sig Dispense Refill  . ADVAIR DISKUS 250-50 MCG/DOSE AEPB Inhale 1 Inhaler into the lungs Twice daily.      . clopidogrel (PLAVIX) 75 MG tablet Take 75 mg by mouth daily.        . ferrous sulfate 325 (65 FE) MG tablet Take one by mouth twice daily        . metoprolol succinate (TOPROL-XL) 50 MG 24 hr tablet TAKE 1 TABLET EVERY DAY  30 tablet  6  . nitroGLYCERIN (NITROSTAT) 0.4 MG SL tablet Place 0.4 mg under the tongue every 5 (five) minutes as needed.        . rosuvastatin (CRESTOR) 10 MG tablet Take 10 mg by mouth daily.         No current facility-administered medications for this visit.    Past Medical History  Diagnosis Date  . Hyperlipidemia   . Coronary atherosclerosis of native coronary artery     Occluded circ with collaterals and 90% RCA (previous stents at [facility], details not clear), LVEF 48% 1/11 - inferior and inferolateral scar by Myoview 1/11,   . Monoclonal gammopathy of undetermined significance   . Renal insufficiency   . Pulmonary embolism     History of recurrent DVT and PE status post IVC filter, previously on Coumadin  . Osteoarthritis   . History of hematuria   . Chronic anemia     Social  History Ms. Sauls reports that she quit smoking about 24 years ago. Her smoking use included Cigarettes. She has a 2.4 pack-year smoking history. She has never used smokeless tobacco. Ms. Gertz reports that she does not drink alcohol.  Review of Systems No active angina symptoms. Negative except as outlined.  Physical Examination Filed Vitals:   04/06/13 1304  BP: 149/77  Pulse: 91   Filed Weights   04/06/13 1304  Weight: 249 lb (112.946 kg)    Comfortable at rest. HEENT: Conjunctiva and lids normal, oropharynx clear.  Neck: Supple, no elevated JVP or carotid bruits, no thyromegaly.  Lungs: Clear to auscultation, nonlabored breathing at rest.  Cardiac: Regular rate and rhythm, no S3, soft systolic murmur, no pericardial rub.  Abdomen: Soft, nontender, bowel sounds present, no guarding or rebound.  Extremities: 1+ ankle edema, distal pulses 2+.  Skin: Warm and dry.  Musculoskeletal: No kyphosis.  Neuropsychiatric: Alert and oriented x3, affect grossly appropriate.   Problem List and Plan   Leg edema No right-sided DVT based on Dopplers. Plan will be at add HCTZ 25 mg daily to her current regimen, this may also help with blood pressure. Followup BMET in 2 weeks.  CAD, NATIVE VESSEL No active angina symptoms on medical therapy. Continue observation.  HYPERLIPIDEMIA-MIXED Question whether intermittent muscle cramping may be related  to Crestor per discussion with patient. Will have her come off the medication and then rechallenge to see if there is a clear relation. If so we can discuss alternate management.    Satira Sark, M.D., F.A.C.C.

## 2013-04-06 NOTE — Assessment & Plan Note (Signed)
No right-sided DVT based on Dopplers. Plan will be at add HCTZ 25 mg daily to her current regimen, this may also help with blood pressure. Followup BMET in 2 weeks.

## 2013-04-06 NOTE — Assessment & Plan Note (Signed)
Question whether intermittent muscle cramping may be related to Crestor per discussion with patient. Will have her come off the medication and then rechallenge to see if there is a clear relation. If so we can discuss alternate management.

## 2013-04-24 ENCOUNTER — Telehealth: Payer: Self-pay | Admitting: *Deleted

## 2013-04-24 MED ORDER — HYDROCHLOROTHIAZIDE 25 MG PO TABS: 12.5000 mg | ORAL_TABLET | Freq: Every day | ORAL | Status: DC

## 2013-04-24 NOTE — Telephone Encounter (Signed)
Message copied by Merlene Laughter on Tue Apr 24, 2013 10:45 AM ------      Message from: Satira Sark      Created: Tue Apr 24, 2013  5:54 AM       Reviewed.  BUN and creatinine have both increased some on HCTZ.  Let's cut dose back to 12.5 mg daily. ------

## 2013-04-24 NOTE — Telephone Encounter (Signed)
Patient and daughter informed. Patient has tablets currently and will break it in half until its finished. New prescription has been sent.

## 2013-04-30 ENCOUNTER — Telehealth: Payer: Self-pay | Admitting: Cardiovascular Disease

## 2013-04-30 NOTE — Telephone Encounter (Signed)
Patient has a question about medication she is taking

## 2013-05-08 NOTE — Telephone Encounter (Signed)
Patient was informed to hold crestor at last office visit to see if her muscle cramping would improve. Patient was also informed to let MD know about her edema in her feet. Patient is having less muscle cramping since holding crestor. Patient still has swelling in feet and ankles but its no worse than before. Patient said she can wear her shoe now. Nurse advised patient to keep her feet elevated as much as possible and that MD would be notified.

## 2013-05-09 NOTE — Telephone Encounter (Signed)
Noted. Would suggest that she rechallenge by starting Crestor and see if the cramping in her legs returns. If this is the case, will likely need to stop Crestor and consider a different statin. I placed her on HCTZ at the most recent visit for her leg edema. Continue to follow. Agree with elevating her feet when able.

## 2013-05-10 NOTE — Telephone Encounter (Signed)
Patient informed. 

## 2013-06-24 ENCOUNTER — Other Ambulatory Visit: Payer: Self-pay | Admitting: Physician Assistant

## 2013-08-24 ENCOUNTER — Encounter: Payer: Self-pay | Admitting: Cardiology

## 2013-08-24 ENCOUNTER — Telehealth: Payer: Self-pay | Admitting: Cardiology

## 2013-08-24 DIAGNOSIS — R600 Localized edema: Secondary | ICD-10-CM

## 2013-08-24 MED ORDER — FUROSEMIDE 40 MG PO TABS: 40.0000 mg | ORAL_TABLET | Freq: Every day | ORAL | Status: DC

## 2013-08-24 NOTE — Telephone Encounter (Signed)
Pt daughter informed of appt scheduled for 09-03-2013 at 11:40 AM in the [place] office

## 2013-08-24 NOTE — Telephone Encounter (Signed)
Patient daughter informed of medication changes and lab work. Lab order will be mailed to daughter home today at [place]. Lasix 40 MG sent to pharmacy electronically. Pt daughter agreed to take an appt in [place]. Informed her I would call her back with day and time. Pt daughter advised pt to elevate legs while sitting in her chair during the day and while in bed at night. Pt daughter also asked about a stationary bike. No history of falls and pt seems to be stable when she walks but I informed her to contact her PCP about this question.

## 2013-08-24 NOTE — Telephone Encounter (Signed)
Patient started to gain fluid in both legs last Friday 08-17-13. Pt then decided to increase HCTZ from 1/2 tablet to 1 tablet when she noticed the swelling in both legs and ankles on her own. Patient went to PCP (Dr. Berdine Addison) yesterday 08-23-13 and he told her that he believed she had venous insufficiency. He said if she was taking 12.5 MG he wanted her to double it but if she was taking 25 MG to call his office and let him know. Pt daughter tried calling but Dr. Berdine Addison but he was already out of office for the day. She said she would be calling him today, to inform him that pt was taking 25 MG of the HCTZ. Pt daughter stated that she asked her mother if she could tell a difference in how many times she was going to the bathroom and pt stated that she could not tell a difference in the frequency. Pt has not had any weight gain and has even lost weight because pt is not eating like she normally does. Pt becomes SOB when she walks. Pt daughter stated that patient has some pain in her legs when being touched.

## 2013-08-24 NOTE — Telephone Encounter (Signed)
Wants mom seen asap for her feet and ankles swelling really bad.  Has been to PCP and patient doubledhydrochlorothiazide (HYDRODIURIL) 25 MG tablet on her own.   Daughter is really worried.   Soonest appointment with Dr Domenic Polite in [place] is in Jan 2015. I offered Nov 24th in [place], but she didn't take.

## 2013-08-24 NOTE — Telephone Encounter (Signed)
Reviewed documentation. Patient has history chronic edema in her legs with prior history of DVT and PE, status post IVC filter and not on long-term anticoagulation. When I saw her in June we actually added HCTZ 25 mg daily to try and help. She had a lower extremity venous Doppler that was negative for DVT. Not certain that advancing HCTZ further will help very much. We could consider stopping HCTZ and placing her on Lasix 40 mg daily, although she will need to have close followup with BMET in a week since she has renal insufficiency at baseline and this may worsen with more aggressive diuresis. Try and get her a visit with me in the next few weeks, either [place] or [place] office.

## 2013-08-31 ENCOUNTER — Encounter: Payer: Self-pay | Admitting: Cardiology

## 2013-08-31 ENCOUNTER — Telehealth: Payer: Self-pay | Admitting: Cardiology

## 2013-08-31 NOTE — Telephone Encounter (Signed)
This is a very low calcium level, I am not certain of the etiology, and was not specifically looking for this when I checked the lab work. Calcium was normal at 9.2 in July. I am concerned that this might be an error, however if her calcium is truly that low, she is at risk for adverse symptoms and may need to have it acutely treated and corrrected. Please have her go to the [facility] ER and get a CMET with magnesium. Let the ER staff know that she is coming so that they can assess her for symptoms of hypocalcemia. They will likely need to do a corrected calcium for albumin or an ionized calcium (if available) if in fact the repeat level is truly that low. They may need to ask for internal medicine consultation to help sort this out as well.

## 2013-08-31 NOTE — Telephone Encounter (Signed)
Alison [name] from [facility] called with panic value for Calcium of 6.9.

## 2013-08-31 NOTE — Telephone Encounter (Signed)
Pt and daughter informed of below. Called [facility] and informed them that pt was coming to ER and what CA level was and what MD was advising.

## 2013-09-03 ENCOUNTER — Encounter: Payer: Self-pay | Admitting: Cardiology

## 2013-09-03 ENCOUNTER — Ambulatory Visit (INDEPENDENT_AMBULATORY_CARE_PROVIDER_SITE_OTHER): Payer: Medicare Other | Admitting: Cardiology

## 2013-09-03 DIAGNOSIS — E785 Hyperlipidemia, unspecified: Secondary | ICD-10-CM

## 2013-09-03 DIAGNOSIS — I2510 Atherosclerotic heart disease of native coronary artery without angina pectoris: Secondary | ICD-10-CM

## 2013-09-03 DIAGNOSIS — R609 Edema, unspecified: Secondary | ICD-10-CM

## 2013-09-03 DIAGNOSIS — R600 Localized edema: Secondary | ICD-10-CM

## 2013-09-03 NOTE — Assessment & Plan Note (Signed)
Symptomatically stable, continue observation.

## 2013-09-03 NOTE — Progress Notes (Signed)
Clinical Summary Alison [name] is an 79 y.o.female presenting for followup. She was last seen in June. At that time we added HCTZ to her regimen. Interval telephone notes reviewed. HCTZ was changed to Lasix due to leg edema. She had a recent followup BMET (noted below) with incidental finding of significant hypocalcemia. She was sent to the ER at [facility] for followup assessment. She was seen by Dr. Craige Cotta - records indicate that he did not find her to be symptomatic, he checked an albumin level which was low normal, and placed her on calcium supplements. She had no other workup.  Lab work on November 21 showed BUN 30, creatinine 1.4, calcium 6.9, sodium 141, potassium 4.4, albumin 3.6. Calcium level in July was normal at 9.2. She reports no obvious symptoms, no muscle spasms or tetany.   She is here with her daughter. She states that she feels better, less leg edema on Lasix. Still not particularly active. She has no other symptoms. Weight is down 6 pounds from last visit in June.   No Known Allergies  Current Outpatient Prescriptions  Medication Sig Dispense Refill  . ADVAIR DISKUS 250-50 MCG/DOSE AEPB Inhale 1 Inhaler into the lungs Twice daily.      . clopidogrel (PLAVIX) 75 MG tablet Take 75 mg by mouth daily.        . ferrous sulfate 325 (65 FE) MG tablet Take one by mouth twice daily        . furosemide (LASIX) 40 MG tablet Take 1 tablet (40 mg total) by mouth daily.  630 tablet  6  . losartan (COZAAR) 25 MG tablet       . metoprolol succinate (TOPROL-XL) 50 MG 24 hr tablet TAKE 1 TABLET EVERY DAY  30 tablet  6  . nitroGLYCERIN (NITROSTAT) 0.4 MG SL tablet Place 0.4 mg under the tongue every 5 (five) minutes as needed.        . rosuvastatin (CRESTOR) 10 MG tablet Take 10 mg by mouth daily.         No current facility-administered medications for this visit.    Past Medical History  Diagnosis Date  . Hyperlipidemia   . Coronary atherosclerosis of native coronary artery    Occluded circ with collaterals and 90% RCA (previous stents at [facility], details not clear), LVEF 48% 1/11 - inferior and inferolateral scar by Myoview 1/11,   . Monoclonal gammopathy of undetermined significance   . Renal insufficiency   . Pulmonary embolism     History of recurrent DVT and PE status post IVC filter, previously on Coumadin  . Osteoarthritis   . History of hematuria   . Chronic anemia     Social History Alison [name] reports that she quit smoking about 24 years ago. Her smoking use included Cigarettes. She has a 2.4 pack-year smoking history. She has never used smokeless tobacco. Alison [name] reports that she does not drink alcohol.  Review of Systems Negative except as outlined.  Physical Examination Filed Vitals:   09/03/13 1147  BP: 136/77  Pulse: 70   Filed Weights   09/03/13 1147  Weight: 243 lb (110.224 kg)    Patient appears comfortable at rest.  HEENT: Conjunctiva and lids normal, oropharynx clear.  Neck: Supple, no elevated JVP or carotid bruits, no thyromegaly.  Lungs: Clear to auscultation, nonlabored breathing at rest.  Cardiac: Regular rate and rhythm, no S3, soft systolic murmur, no pericardial rub.  Abdomen: Soft, nontender, bowel sounds present, no guarding or rebound.  Extremities: Trace edema, distal pulses 2+.  Skin: Warm and dry.  Musculoskeletal: No kyphosis.  Neuropsychiatric: Alert and oriented x3, affect grossly appropriate.    Problem List and Plan   Leg edema Significantly improved on Lasix, weight is also down suggesting improved volume status. We will plan a followup BMET with clinical visit in 6 weeks.  Hypocalcemia Noted incidentally on recent lab work. Patient was seen in the [facility] ER, however did not have a repeat calcium level, nor an ionized calcium. She did have a low normal albumin. She is asymptomatic, still raises the possibility that this could be spurious. She is taking calcium carbonate at this time. No  history of parathyroid disease or vitamin D deficiency. No obvious autoimmune disease. She has pending followup with Dr. Berdine Addison for further evaluation.  HYPERLIPIDEMIA-MIXED She continues on Crestor.  CAD, NATIVE VESSEL Symptomatically stable, continue observation.    Satira Sark, M.D., F.A.C.C.

## 2013-09-03 NOTE — Assessment & Plan Note (Signed)
Noted incidentally on recent lab work. Patient was seen in the [facility] ER, however did not have a repeat calcium level, nor an ionized calcium. She did have a low normal albumin. She is asymptomatic, still raises the possibility that this could be spurious. She is taking calcium carbonate at this time. No history of parathyroid disease or vitamin D deficiency. No obvious autoimmune disease. She has pending followup with Dr. Berdine Addison for further evaluation.

## 2013-09-03 NOTE — Assessment & Plan Note (Signed)
Significantly improved on Lasix, weight is also down suggesting improved volume status. We will plan a followup BMET with clinical visit in 6 weeks.

## 2013-09-03 NOTE — Patient Instructions (Signed)
Your physician recommends that you schedule a follow-up appointment in:  6 weeks   Your physician recommends that you return for lab work  one week before your next visit for BMET   Begin taking TUMS 250 mg daily

## 2013-09-03 NOTE — Assessment & Plan Note (Signed)
She continues on Crestor.

## 2013-09-13 ENCOUNTER — Other Ambulatory Visit: Payer: Self-pay

## 2013-10-17 ENCOUNTER — Ambulatory Visit: Payer: Medicare Other | Admitting: Cardiology

## 2013-10-17 ENCOUNTER — Encounter: Payer: Self-pay | Admitting: Cardiology

## 2013-10-17 ENCOUNTER — Ambulatory Visit (INDEPENDENT_AMBULATORY_CARE_PROVIDER_SITE_OTHER): Payer: Medicare Other | Admitting: Cardiology

## 2013-10-17 VITALS — BP 151/70 | HR 70 | Ht 61.0 in | Wt 240.0 lb

## 2013-10-17 DIAGNOSIS — R609 Edema, unspecified: Secondary | ICD-10-CM

## 2013-10-17 DIAGNOSIS — I2510 Atherosclerotic heart disease of native coronary artery without angina pectoris: Secondary | ICD-10-CM

## 2013-10-17 DIAGNOSIS — I10 Essential (primary) hypertension: Secondary | ICD-10-CM

## 2013-10-17 DIAGNOSIS — R600 Localized edema: Secondary | ICD-10-CM

## 2013-10-17 LAB — BASIC METABOLIC PANEL
BUN: 24 mg/dL — ABNORMAL HIGH (ref 6–23)
CO2: 29 mEq/L (ref 19–32)
Calcium: 7.6 mg/dL — ABNORMAL LOW (ref 8.4–10.5)
Chloride: 105 mEq/L (ref 96–112)
Creat: 1.19 mg/dL — ABNORMAL HIGH (ref 0.50–1.10)
Glucose, Bld: 83 mg/dL (ref 70–99)
Potassium: 4.1 mEq/L (ref 3.5–5.3)
Sodium: 141 mEq/L (ref 135–145)

## 2013-10-17 MED ORDER — FUROSEMIDE 40 MG PO TABS: 40.0000 mg | ORAL_TABLET | Freq: Every day | ORAL | Status: DC

## 2013-10-17 NOTE — Assessment & Plan Note (Signed)
Recent level 7.6. Keep follow with Dr. Berdine Addison.

## 2013-10-17 NOTE — Assessment & Plan Note (Signed)
Symptomatically stable on medical therapy. ECG reviewed and also stable. Continue medical therapy which includes Plavix, blocker, and statin.

## 2013-10-17 NOTE — Patient Instructions (Addendum)
Your physician recommends that you schedule a follow-up appointment in: 3 MONTHS  Your physician recommends THAT YOU CONTINUE TO TAKE YOUR LASIX 40MG  ONCE DAILY, IF YOU SEE SWELLING PLEASE CONTACT OUR OFFICE AT [phone] FOR THE NEXT STEPS  Your physician recommends that you return for lab work in: [place] TO YOUR 3 MONTH FOLLOW UP APPOINTMENT, YOU DO NOT NEED TO BE FASTING ([place] BMET)

## 2013-10-17 NOTE — Assessment & Plan Note (Signed)
Refill given for Lasix. Will continue 40 mg daily, could consider increasing further if needed. Renal function was stable by recent assessment.

## 2013-10-17 NOTE — Progress Notes (Signed)
Clinical Summary Alison [name] is an 80 y.o.female last seen in November 2014. At that time she reported significant improvement in leg edema on Lasix with associated weight loss. Today she brought in her medications, and Lasix was not found. It is not entirely clear whether she ran out or not. Weight is down 3 pounds from last visit. We told her that we would make a refill to make sure that she has it.  Recent followup lab work showed sodium 141, potassium 4.1, BUN down to 24, and creatinine down to 1.1. Calcium remained low at 7.6, being followed by Dr. Berdine Addison her primary care provider.  No angina symptoms. ECG reviewed showing sinus rhythm, decreased R-wave progression, stable.   No Known Allergies  Current Outpatient Prescriptions  Medication Sig Dispense Refill  . ADVAIR DISKUS 250-50 MCG/DOSE AEPB Inhale 1 Inhaler into the lungs Twice daily.      . clopidogrel (PLAVIX) 75 MG tablet Take 75 mg by mouth daily.        . ferrous sulfate 325 (65 FE) MG tablet Take one by mouth twice daily        . furosemide (LASIX) 40 MG tablet Take 1 tablet (40 mg total) by mouth daily.  630 tablet  6  . losartan (COZAAR) 25 MG tablet       . metoprolol succinate (TOPROL-XL) 50 MG 24 hr tablet TAKE 1 TABLET EVERY DAY  30 tablet  6  . nitroGLYCERIN (NITROSTAT) 0.4 MG SL tablet Place 0.4 mg under the tongue every 5 (five) minutes as needed.        . rosuvastatin (CRESTOR) 10 MG tablet Take 10 mg by mouth daily.         No current facility-administered medications for this visit.    Past Medical History  Diagnosis Date  . Hyperlipidemia   . Coronary atherosclerosis of native coronary artery     Occluded circ with collaterals and 90% RCA (previous stents at [facility], details not clear), LVEF 48% 1/11 - inferior and inferolateral scar by Myoview 1/11,   . Monoclonal gammopathy of undetermined significance   . Renal insufficiency   . Pulmonary embolism     History of recurrent DVT and PE status post  IVC filter, previously on Coumadin  . Osteoarthritis   . History of hematuria   . Chronic anemia     Social History Ms. Calamari reports that she quit smoking about 25 years ago. Her smoking use included Cigarettes. She has a 2.4 pack-year smoking history. She has never used smokeless tobacco. Ms. Kroft reports that she does not drink alcohol.  Review of Systems No palpitations, dizziness, syncope.  Physical Examination Filed Vitals:   10/17/13 0847  BP: 151/70  Pulse: 70   Filed Weights   10/17/13 0847  Weight: 240 lb (108.863 kg)    Patient appears comfortable at rest.  HEENT: Conjunctiva and lids normal, oropharynx clear.  Neck: Supple, no elevated JVP or carotid bruits, no thyromegaly.  Lungs: Clear to auscultation, nonlabored breathing at rest.  Cardiac: Regular rate and rhythm, no S3, soft systolic murmur, no pericardial rub.  Abdomen: Soft, nontender, bowel sounds present, no guarding or rebound.  Extremities: 1+ edema, distal pulses 2+.    Problem List and Plan   CAD, NATIVE VESSEL Symptomatically stable on medical therapy. ECG reviewed and also stable. Continue medical therapy which includes Plavix, blocker, and statin.  Leg edema Refill given for Lasix. Will continue 40 mg daily, could consider increasing further if  needed. Renal function was stable by recent assessment.  Hypocalcemia Recent level 7.6. Keep follow with Dr. Berdine Addison.  HYPERLIPIDEMIA-MIXED She continues on Crestor with followup per Dr. Berdine Addison.    Satira Sark, M.D., F.A.C.C.

## 2013-10-17 NOTE — Assessment & Plan Note (Signed)
She continues on Crestor with followup per Dr. Berdine Addison.

## 2014-02-19 ENCOUNTER — Other Ambulatory Visit: Payer: Self-pay | Admitting: *Deleted

## 2014-02-19 MED ORDER — METOPROLOL SUCCINATE ER 50 MG PO TB24: 50.0000 mg | ORAL_TABLET | Freq: Every day | ORAL | Status: DC

## 2014-02-22 ENCOUNTER — Other Ambulatory Visit: Payer: Self-pay | Admitting: *Deleted

## 2014-02-22 MED ORDER — FUROSEMIDE 40 MG PO TABS
40.0000 mg | ORAL_TABLET | Freq: Every day | ORAL | Status: DC
Start: 2014-02-22 — End: 2014-03-21

## 2014-03-21 ENCOUNTER — Ambulatory Visit (INDEPENDENT_AMBULATORY_CARE_PROVIDER_SITE_OTHER): Payer: Medicare Other | Admitting: Adult Health

## 2014-03-21 ENCOUNTER — Encounter: Payer: Self-pay | Admitting: Adult Health

## 2014-03-21 VITALS — BP 132/94 | HR 73 | Ht 61.0 in | Wt 230.0 lb

## 2014-03-21 DIAGNOSIS — R609 Edema, unspecified: Secondary | ICD-10-CM

## 2014-03-21 DIAGNOSIS — E785 Hyperlipidemia, unspecified: Secondary | ICD-10-CM

## 2014-03-21 DIAGNOSIS — I2510 Atherosclerotic heart disease of native coronary artery without angina pectoris: Secondary | ICD-10-CM

## 2014-03-21 DIAGNOSIS — R600 Localized edema: Secondary | ICD-10-CM

## 2014-03-21 MED ORDER — LOSARTAN POTASSIUM 25 MG PO TABS: 25.0000 mg | ORAL_TABLET | Freq: Every day | ORAL | Status: DC

## 2014-03-21 MED ORDER — FUROSEMIDE 40 MG PO TABS: 40.0000 mg | ORAL_TABLET | Freq: Every day | ORAL | Status: DC

## 2014-03-21 MED ORDER — ROSUVASTATIN CALCIUM 10 MG PO TABS: 10.0000 mg | ORAL_TABLET | Freq: Every day | ORAL | Status: DC

## 2014-03-21 MED ORDER — CLOPIDOGREL BISULFATE 75 MG PO TABS: 75.0000 mg | ORAL_TABLET | Freq: Every day | ORAL | Status: DC

## 2014-03-21 MED ORDER — METOPROLOL SUCCINATE ER 50 MG PO TB24
50.0000 mg | ORAL_TABLET | Freq: Every day | ORAL | Status: DC
Start: 2014-03-21 — End: 2015-05-03

## 2014-03-21 NOTE — Progress Notes (Signed)
HPI Alison [name] is an 80 year old female patient of Dr. Domenic Polite we are following for ongoing assessment and management of coronary artery disease, chronic lower extremity. edema, and hypertension. At last visit the patient was given refill on Lasix as she had not been taking it and did have some edema. She denied any discomfort in her chest or shortness of breath. She is here for six-month followup.  She comes today feeling very well. She has lost 10 pounds and her blood pressure is much better controlled. She is followed by Dr. Berdine Addison, who is done lab work on her within the last 6 weeks. She states he told her everything came back fine. We will request those results. Otherwise she is medically compliant, without any complaints of bleeding chest pain muscle aches and pains other than arthritis. No further edema.   No Known Allergies  Current Outpatient Prescriptions  Medication Sig Dispense Refill  . ADVAIR DISKUS 250-50 MCG/DOSE AEPB Inhale 1 Inhaler into the lungs Twice daily.      . clopidogrel (PLAVIX) 75 MG tablet Take 75 mg by mouth daily.        . ferrous sulfate 325 (65 FE) MG tablet Take one by mouth twice daily        . furosemide (LASIX) 40 MG tablet Take 1 tablet (40 mg total) by mouth daily.  90 tablet  0  . losartan (COZAAR) 25 MG tablet Take 25 mg by mouth daily.       . metoprolol succinate (TOPROL-XL) 50 MG 24 hr tablet Take 1 tablet (50 mg total) by mouth daily. Take with or immediately following a meal.  90 tablet  0  . nitroGLYCERIN (NITROSTAT) 0.4 MG SL tablet Place 0.4 mg under the tongue every 5 (five) minutes as needed.        . rosuvastatin (CRESTOR) 10 MG tablet Take 10 mg by mouth daily.         No current facility-administered medications for this visit.    Past Medical History  Diagnosis Date  . Hyperlipidemia   . Coronary atherosclerosis of native coronary artery     Occluded circ with collaterals and 90% RCA (previous stents at [facility], details not  clear), LVEF 48% 1/11 - inferior and inferolateral scar by Myoview 1/11,   . Monoclonal gammopathy of undetermined significance   . Renal insufficiency   . Pulmonary embolism     History of recurrent DVT and PE status post IVC filter, previously on Coumadin  . Osteoarthritis   . History of hematuria   . Chronic anemia     Past Surgical History  Procedure Laterality Date  . Right knee replacement  2011  . Left adrenalectomy  2009  . Right breast biopsy  2008    ROS: Review of systems complete and found to be negative unless listed above  PHYSICAL EXAM BP 132/94  Pulse 73  Ht 5\' 1"  (1.549 m)  Wt 230 lb (104.327 kg)  BMI 43.48 kg/m2  SpO2 99% General: Well developed, well nourished, in no acute distress Head: Eyes PERRLA, No xanthomas.   Normal cephalic and atramatic  Lungs: Clear bilaterally to auscultation and percussion. Heart: HRRR S1 S2, without MRG.  Pulses are 2+ & equal.            No carotid bruit. No JVD.  No abdominal bruits. No femoral bruits. Abdomen: Bowel sounds are positive, abdomen soft and non-tender without masses or  Hernia's noted. Msk:  Back normal, normal gait. Normal strength and tone for age. Extremities: No clubbing, cyanosis or edema.  DP +1 Neuro: Alert and oriented X 3. Psych:  Good affect, responds appropriately    ASSESSMENT AND PLAN

## 2014-03-21 NOTE — Assessment & Plan Note (Signed)
She continues on Crestor 10 mg daily. Recent labs been completed by her primary care physician Dr. Berdine Addison. No changes in her medication regimen at this time. We will see her in 6 months.

## 2014-03-21 NOTE — Assessment & Plan Note (Signed)
No complaints of angina. She will continue on Plavix, but pressure is well-controlled, on metoprolol and losartan. No further cardiac testing is planned unless she becomes symptomatic.

## 2014-03-21 NOTE — Patient Instructions (Addendum)
Your physician wants you to follow-up in: 6 months with K.Lawrence NP, or Dr.McDowell You will receive a reminder letter in the mail two months in advance. If you don't receive a letter, please call our office to schedule the follow-up appointment.     Your physician recommends that you continue on your current medications as directed. Please refer to the Current Medication list given to you today.  I have refilled all your medications for 90 day supply with 3 refills   Thank you for choosing [facility] !

## 2014-03-21 NOTE — Assessment & Plan Note (Signed)
Since restarting Lasix as directed by Dr. Domenic Polite on last visit, the patient has lost 10 pounds, she has no further complaints of fluid retention. She is being careful concerning salt intake. She was seen by Dr. Berdine Addison in May of 2015 with lab work completed. See her again in 6 months unless symptomatic.

## 2014-03-21 NOTE — Progress Notes (Deleted)
Name: Alison [name]    DOB: Dec 22, 1930  Age: 80 y.o.  MR#: [id number]       PCP:  Maggie Font, MD      Insurance: Payor: [employer] MEDICARE / Plan: AARP MEDICARE COMPLETE / Product Type: *No Product type* /   CC:    Chief Complaint  Patient presents with  . Coronary Artery Disease  . Hypertension    VS Filed Vitals:   03/21/14 1305  BP: 132/94  Pulse: 73  Height: $Remove'5\' 1"'PdrAoec$  (1.549 m)  Weight: 230 lb (104.327 kg)  SpO2: 99%    Weights Current Weight  03/21/14 230 lb (104.327 kg)  10/17/13 240 lb (108.863 kg)  09/03/13 243 lb (110.224 kg)    Blood Pressure  BP Readings from Last 3 Encounters:  03/21/14 132/94  10/17/13 151/70  09/03/13 136/77     Admit date:  (Not on file) Last encounter with RMR:  Visit date not found   Allergy Review of patient's allergies indicates no known allergies.  Current Outpatient Prescriptions  Medication Sig Dispense Refill  . ADVAIR DISKUS 250-50 MCG/DOSE AEPB Inhale 1 Inhaler into the lungs Twice daily.      . clopidogrel (PLAVIX) 75 MG tablet Take 75 mg by mouth daily.        . ferrous sulfate 325 (65 FE) MG tablet Take one by mouth twice daily        . furosemide (LASIX) 40 MG tablet Take 1 tablet (40 mg total) by mouth daily.  90 tablet  0  . losartan (COZAAR) 25 MG tablet Take 25 mg by mouth daily.       . metoprolol succinate (TOPROL-XL) 50 MG 24 hr tablet Take 1 tablet (50 mg total) by mouth daily. Take with or immediately following a meal.  90 tablet  0  . nitroGLYCERIN (NITROSTAT) 0.4 MG SL tablet Place 0.4 mg under the tongue every 5 (five) minutes as needed.        . rosuvastatin (CRESTOR) 10 MG tablet Take 10 mg by mouth daily.         No current facility-administered medications for this visit.    Discontinued Meds:   There are no discontinued medications.  Patient Active Problem List   Diagnosis Date Noted  . Hypocalcemia 09/03/2013  . Leg edema 04/06/2013  . History of pulmonary embolus (PE) 01/05/2011  .  HYPERLIPIDEMIA-MIXED 06/24/2009  . CAD, NATIVE VESSEL 06/24/2009    LABS    Component Value Date/Time   NA 141 10/16/2013 1251   NA 135 03/06/2010 0535   NA 137 03/05/2010 0612   K 4.1 10/16/2013 1251   K 4.6 03/06/2010 0535   K 5.2* 03/05/2010 0612   CL 105 10/16/2013 1251   CL 107 03/06/2010 0535   CL 107 03/05/2010 0612   CO2 29 10/16/2013 1251   CO2 21 03/06/2010 0535   CO2 24 03/05/2010 0612   GLUCOSE 83 10/16/2013 1251   GLUCOSE 117* 03/06/2010 0535   GLUCOSE 113* 03/05/2010 0612   BUN 24* 10/16/2013 1251   BUN 29* 03/06/2010 0535   BUN 34* 03/05/2010 0612   CREATININE 1.19* 10/16/2013 1251   CREATININE 1.48* 03/06/2010 0535   CREATININE 2.19* 03/05/2010 0612   CREATININE 2.57* 03/04/2010 2015   CALCIUM 7.6* 10/16/2013 1251   CALCIUM 8.1* 03/06/2010 0535   CALCIUM 7.8* 03/05/2010 0612   GFRNONAA 34* 03/06/2010 0535   GFRNONAA 22* 03/05/2010 0612   GFRNONAA 18* 03/04/2010 2015   GFRAA  Value:  41        The eGFR has been calculated using the MDRD equation. This calculation has not been validated in all clinical situations. eGFR's persistently <60 mL/min signify possible Chronic Kidney Disease.* 03/06/2010 0535   GFRAA  Value: 26        The eGFR has been calculated using the MDRD equation. This calculation has not been validated in all clinical situations. eGFR's persistently <60 mL/min signify possible Chronic Kidney Disease.* 03/05/2010 0612   GFRAA  Value: 22        The eGFR has been calculated using the MDRD equation. This calculation has not been validated in all clinical situations. eGFR's persistently <60 mL/min signify possible Chronic Kidney Disease.* 03/04/2010 2015   CMP     Component Value Date/Time   NA 141 10/16/2013 1251   K 4.1 10/16/2013 1251   CL 105 10/16/2013 1251   CO2 29 10/16/2013 1251   GLUCOSE 83 10/16/2013 1251   BUN 24* 10/16/2013 1251   CREATININE 1.19* 10/16/2013 1251   CREATININE 1.48* 03/06/2010 0535   CALCIUM 7.6* 10/16/2013 1251   PROT 7.5 02/27/2010 1051   ALBUMIN 3.8 02/27/2010 1051    AST 24 02/27/2010 1051   ALT 19 02/27/2010 1051   ALKPHOS 118* 02/27/2010 1051   BILITOT 0.4 02/27/2010 1051   GFRNONAA 34* 03/06/2010 0535   GFRAA  Value: 41        The eGFR has been calculated using the MDRD equation. This calculation has not been validated in all clinical situations. eGFR's persistently <60 mL/min signify possible Chronic Kidney Disease.* 03/06/2010 0535       Component Value Date/Time   WBC 13.9* 03/06/2010 0535   WBC 14.9* 03/05/2010 0612   WBC 14.5* 03/04/2010 0355   WBC 9.8 11/12/2009 1016   WBC 9.8 08/20/2009 1317   HGB 9.8* 03/06/2010 0535   HGB 10.2 POST TRANSFUSION SPECIMEN* 03/05/2010 0612   HGB 8.3* 03/04/2010 0355   HGB 10.4* 11/12/2009 1016   HGB 9.0* 08/20/2009 1317   HCT 29.2* 03/06/2010 0535   HCT 29.9* 03/05/2010 0612   HCT 24.8* 03/04/2010 0355   HCT 31.5* 11/12/2009 1016   HCT 28.6* 08/20/2009 1317   MCV 93.9 03/06/2010 0535   MCV 94.0 03/05/2010 0612   MCV 95.6 03/04/2010 0355   MCV 92.4 11/12/2009 1016   MCV 96.3 08/20/2009 1317    Lipid Panel  No results found for this basename: chol, trig, hdl, cholhdl, vldl, ldlcalc    ABG No results found for this basename: phart, pco2, pco2art, po2, po2art, hco3, tco2, acidbasedef, o2sat     No results found for this basename: TSH   BNP (last 3 results) No results found for this basename: PROBNP,  in the last 8760 hours Cardiac Panel (last 3 results) No results found for this basename: CKTOTAL, CKMB, TROPONINI, RELINDX,  in the last 72 hours  Iron/TIBC/Ferritin    Component Value Date/Time   IRON 67 11/12/2009 1017   TIBC 262 11/12/2009 1017   FERRITIN 476* 11/12/2009 1017     EKG Orders placed in visit on 10/17/13  . EKG 12-LEAD     Prior Assessment and Plan Problem List as of 03/21/2014     Cardiovascular and Mediastinum   CAD, NATIVE VESSEL   Last Assessment & Plan   10/17/2013 Office Visit Written 10/17/2013  9:06 AM by Satira Sark, MD     Symptomatically stable on medical therapy. ECG reviewed and  also stable. Continue medical therapy which  includes Plavix, blocker, and statin.      Other   HYPERLIPIDEMIA-MIXED   Last Assessment & Plan   10/17/2013 Office Visit Written 10/17/2013  9:07 AM by Satira Sark, MD     She continues on Crestor with followup per Dr. Berdine Addison.    History of pulmonary embolus (PE)   Last Assessment & Plan   09/25/2012 Office Visit Written 09/25/2012  3:33 PM by Satira Sark, MD     Previous history of PE and DVT status post IVC filter. She no longer takes Coumadin, previously followed by primary care.    Leg edema   Last Assessment & Plan   10/17/2013 Office Visit Written 10/17/2013  9:06 AM by Satira Sark, MD     Refill given for Lasix. Will continue 40 mg daily, could consider increasing further if needed. Renal function was stable by recent assessment.    Hypocalcemia   Last Assessment & Plan   10/17/2013 Office Visit Written 10/17/2013  9:07 AM by Satira Sark, MD     Recent level 7.6. Keep follow with Dr. Berdine Addison.        Imaging: No results found.

## 2014-07-19 ENCOUNTER — Telehealth: Payer: Self-pay | Admitting: *Deleted

## 2014-07-19 NOTE — Telephone Encounter (Signed)
Daughter notified verbalized understanding

## 2014-07-19 NOTE — Telephone Encounter (Signed)
Left message to return call 

## 2014-07-19 NOTE — Telephone Encounter (Signed)
It would not hurt to see a podiatrist if she has concerns. Patient does have prior history of DVT with negative workup for recurrence the setting of leg edema. We have been managing her with Lasix. She could have venous insufficiency because of prior DVT, and always have some residual edema on that side however.

## 2014-07-19 NOTE — Telephone Encounter (Signed)
Daughter Hoyle Sauer) has concerns about her right foot swelling.  States this has been happening off/on x 6 months or longer, but seems to be happening more frequently.  No other c/o chest pain, SOB, dizziness, or weight gain.  Daughter questions if she should see podiatrist / PMD.  States she wants to keep her care in [place].

## 2014-09-20 ENCOUNTER — Other Ambulatory Visit: Payer: Self-pay | Admitting: Family Medicine

## 2014-09-20 DIAGNOSIS — R1012 Left upper quadrant pain: Secondary | ICD-10-CM

## 2014-09-25 ENCOUNTER — Other Ambulatory Visit: Payer: Medicare Other

## 2014-09-26 ENCOUNTER — Ambulatory Visit
Admission: RE | Admit: 2014-09-26 | Discharge: 2014-09-26 | Disposition: A | Discharge status: Home / Self Care | Payer: Medicare Other | Source: Ambulatory Visit | Attending: Family Medicine | Admitting: Family Medicine

## 2014-09-26 DIAGNOSIS — R1012 Left upper quadrant pain: Secondary | ICD-10-CM

## 2014-10-01 ENCOUNTER — Other Ambulatory Visit: Payer: Self-pay | Admitting: Family Medicine

## 2014-10-01 DIAGNOSIS — R1012 Left upper quadrant pain: Secondary | ICD-10-CM

## 2014-10-17 ENCOUNTER — Other Ambulatory Visit: Payer: Self-pay | Admitting: Family Medicine

## 2014-10-17 ENCOUNTER — Ambulatory Visit
Admission: RE | Admit: 2014-10-17 | Discharge: 2014-10-17 | Disposition: A | Discharge status: Home / Self Care | Payer: Medicare Other | Source: Ambulatory Visit | Attending: Family Medicine | Admitting: Family Medicine

## 2014-10-17 DIAGNOSIS — E896 Postprocedural adrenocortical (-medullary) hypofunction: Secondary | ICD-10-CM | POA: Diagnosis not present

## 2014-10-17 DIAGNOSIS — N281 Cyst of kidney, acquired: Secondary | ICD-10-CM | POA: Diagnosis not present

## 2014-10-17 DIAGNOSIS — R1012 Left upper quadrant pain: Secondary | ICD-10-CM

## 2014-10-31 ENCOUNTER — Encounter: Payer: Self-pay | Admitting: Adult Health

## 2014-11-04 ENCOUNTER — Telehealth: Payer: Self-pay

## 2014-11-04 NOTE — Telephone Encounter (Signed)
Alison [name] received her flu shot on September 12, 2014.     Added to historical edit

## 2015-04-09 DIAGNOSIS — M545 Low back pain: Secondary | ICD-10-CM | POA: Diagnosis not present

## 2015-04-09 DIAGNOSIS — E785 Hyperlipidemia, unspecified: Secondary | ICD-10-CM | POA: Diagnosis not present

## 2015-04-09 DIAGNOSIS — I10 Essential (primary) hypertension: Secondary | ICD-10-CM | POA: Diagnosis not present

## 2015-04-15 DIAGNOSIS — I10 Essential (primary) hypertension: Secondary | ICD-10-CM | POA: Diagnosis not present

## 2015-04-15 DIAGNOSIS — E785 Hyperlipidemia, unspecified: Secondary | ICD-10-CM | POA: Diagnosis not present

## 2015-05-03 ENCOUNTER — Other Ambulatory Visit: Payer: Self-pay | Admitting: Adult Health

## 2015-05-23 DIAGNOSIS — Z79899 Other long term (current) drug therapy: Secondary | ICD-10-CM | POA: Diagnosis not present

## 2015-05-23 DIAGNOSIS — I10 Essential (primary) hypertension: Secondary | ICD-10-CM | POA: Diagnosis not present

## 2015-05-23 DIAGNOSIS — M545 Low back pain: Secondary | ICD-10-CM | POA: Diagnosis not present

## 2015-05-23 DIAGNOSIS — G8929 Other chronic pain: Secondary | ICD-10-CM | POA: Diagnosis not present

## 2015-05-23 DIAGNOSIS — Z7902 Long term (current) use of antithrombotics/antiplatelets: Secondary | ICD-10-CM | POA: Diagnosis not present

## 2015-05-23 DIAGNOSIS — J449 Chronic obstructive pulmonary disease, unspecified: Secondary | ICD-10-CM | POA: Diagnosis not present

## 2015-05-23 DIAGNOSIS — M79604 Pain in right leg: Secondary | ICD-10-CM | POA: Diagnosis not present

## 2015-05-23 DIAGNOSIS — M79605 Pain in left leg: Secondary | ICD-10-CM | POA: Diagnosis not present

## 2015-05-27 DIAGNOSIS — I2510 Atherosclerotic heart disease of native coronary artery without angina pectoris: Secondary | ICD-10-CM | POA: Diagnosis not present

## 2015-05-27 DIAGNOSIS — M545 Low back pain: Secondary | ICD-10-CM | POA: Diagnosis not present

## 2015-06-12 ENCOUNTER — Encounter: Payer: Self-pay | Admitting: *Deleted

## 2015-06-15 ENCOUNTER — Other Ambulatory Visit: Payer: Self-pay | Admitting: Adult Health

## 2015-06-17 ENCOUNTER — Encounter: Payer: Self-pay | Admitting: Cardiology

## 2015-06-17 ENCOUNTER — Encounter: Payer: Medicare Other | Admitting: Cardiology

## 2015-06-17 NOTE — Progress Notes (Signed)
Patient canceled.  This encounter was created in error - please disregard. 

## 2015-06-19 ENCOUNTER — Ambulatory Visit: Payer: Medicare Other | Admitting: Cardiology

## 2015-06-30 DIAGNOSIS — Z0000 Encounter for general adult medical examination without abnormal findings: Secondary | ICD-10-CM | POA: Diagnosis not present

## 2015-06-30 DIAGNOSIS — M545 Low back pain: Secondary | ICD-10-CM | POA: Diagnosis not present

## 2015-07-04 ENCOUNTER — Encounter: Payer: Self-pay | Admitting: Cardiology

## 2015-07-04 ENCOUNTER — Ambulatory Visit (INDEPENDENT_AMBULATORY_CARE_PROVIDER_SITE_OTHER): Payer: Medicare Other | Admitting: Cardiology

## 2015-07-04 VITALS — BP 128/82 | HR 63 | Ht 61.0 in | Wt 209.0 lb

## 2015-07-04 DIAGNOSIS — I2510 Atherosclerotic heart disease of native coronary artery without angina pectoris: Secondary | ICD-10-CM

## 2015-07-04 DIAGNOSIS — E782 Mixed hyperlipidemia: Secondary | ICD-10-CM

## 2015-07-04 NOTE — Patient Instructions (Signed)

## 2015-07-04 NOTE — Progress Notes (Signed)
Cardiology Office Note  Date: 07/04/2015   ID: Alison [name], DOB 03/03/1931, MRN [id number]  PCP: Maggie Font, MD  Primary Cardiologist: Rozann Lesches, MD   Chief Complaint  Patient presents with  . Coronary Artery Disease    History of Present Illness: Alison [name] is an 81 y.o. female last seen by Ms. Lawrence NP in June 2015. She presents for a routine follow-up visit. From a cardiac perspective, she remains stable without active angina symptoms. Medications are reviewed below. She reports no major intolerances.  Main complaints are of gout and other arthritic pains affecting her back and legs. She uses a cane, has not been able to exercise. She does do her basic ADLs and other chores.  ECG today shows sinus rhythm with nonspecific T-wave changes.  She continues to follow with Dr. Berdine Addison for primary care.   Past Medical History  Diagnosis Date  . Hyperlipidemia   . Coronary atherosclerosis of native coronary artery     Occluded circ with collaterals and 90% RCA (previous stents at [facility], details not clear), LVEF 48% 1/11 - inferior and inferolateral scar by Myoview 1/11,   . Monoclonal gammopathy of undetermined significance   . Renal insufficiency   . Pulmonary embolism     History of recurrent DVT and PE status post IVC filter, previously on Coumadin  . Osteoarthritis   . History of hematuria   . Chronic anemia     Current Outpatient Prescriptions  Medication Sig Dispense Refill  . ADVAIR DISKUS 250-50 MCG/DOSE AEPB Inhale 1 Inhaler into the lungs Twice daily.    Marland Kitchen allopurinol (ZYLOPRIM) 100 MG tablet Take 100 mg by mouth daily.    . clopidogrel (PLAVIX) 75 MG tablet 1TQ DAILY 90 tablet 2  . CRESTOR 10 MG tablet TAKE 1 TABLET EVERY DAY 90 tablet 2  . ferrous sulfate 325 (65 FE) MG tablet Take one by mouth twice daily      . furosemide (LASIX) 40 MG tablet TAKE 1 TABLET EVERY DAY 90 tablet 2  . HYDROcodone-acetaminophen (NORCO) 7.5-325 MG per tablet  Take 1 tablet by mouth every 6 (six) hours as needed for moderate pain.    Marland Kitchen losartan (COZAAR) 25 MG tablet TAKE 1 TABLET BY MOUTH EVERY DAY 90 tablet 1  . metoprolol succinate (TOPROL-XL) 50 MG 24 hr tablet TAKE 1 TABLET EVERY DAY WITH OR IMMEDIATELY FOLLOWING A MEAL 90 tablet 2  . nitroGLYCERIN (NITROSTAT) 0.4 MG SL tablet Place 0.4 mg under the tongue every 5 (five) minutes as needed.      . predniSONE (DELTASONE) 5 MG tablet Take 5 mg by mouth daily with breakfast. Taking 2 in AM 1 at lunch 1 at supper and 2 at bedtime     No current facility-administered medications for this visit.    Allergies:  Review of patient's allergies indicates no known allergies.   Social History: The patient  reports that she quit smoking about 26 years ago. Her smoking use included Cigarettes. She has a 2.4 pack-year smoking history. She has never used smokeless tobacco. She reports that she does not drink alcohol or use illicit drugs.   ROS:  Please see the history of present illness. Otherwise, complete review of systems is positive for none.  All other systems are reviewed and negative.   Physical Exam: VS:  BP 128/82 mmHg  Pulse 63  Ht 5\' 1"  (1.549 m)  Wt 209 lb (94.802 kg)  BMI 39.51 kg/m2  SpO2 97%, BMI Body  mass index is 39.51 kg/(m^2).  Wt Readings from Last 3 Encounters:  07/04/15 209 lb (94.802 kg)  03/21/14 230 lb (104.327 kg)  10/17/13 240 lb (108.863 kg)     Obese woman, appears comfortable at rest.  HEENT: Conjunctiva and lids normal, oropharynx clear.  Neck: Supple, no elevated JVP or carotid bruits, no thyromegaly.  Lungs: Clear to auscultation, nonlabored breathing at rest.  Cardiac: Regular rate and rhythm, no S3, soft systolic murmur, no pericardial rub.  Abdomen: Soft, nontender, bowel sounds present, no guarding or rebound.  Extremities: 1+ edema, distal pulses 2+.    ECG: ECG is ordered today.   Other Studies Reviewed Today:  Persantine Cardiolite  11/03/2009: Findings: Immediate post-dipyridamole images demonstrate diminished uptake in the inferolateral wall as described on the report of the previous examination. The small anterior septal defect is also again noted. Initial resting images with similar findings. No convincing evidence of reversibility to suggest myocardial ischemia. Findings confirmed by the computer generated polar map.  Review of the gated images demonstrates hypokinesis in the inferior wall extending into the inferolateral wall.  Estimated Q G S ejection fraction measured 48%, with an end- diastolic volume of 96 ml and an end-systolic volume of 50 ml. The estimated ejection fraction on the prior examination was not given in the report.  IMPRESSION:  1. Inferolateral wall infarct and tiny focal anteroseptal infarct. No evidence of myocardial ischemia. These were described on the report of a prior examination from May, 2004. 2. Inferior wall and inferolateral wall hypokinesis. 3. Estimated Q G S ejection fraction 48%.  Assessment and Plan:  1. Symptomatically stable CAD, known disease in the circumflex and RCA distribution as outlined above. Our plan is to continue medical therapy and observation. She remains on Plavix, Cozaar, Toprol-XL, and has nitroglycerin available.  2. Hyperlipidemia, on Crestor. Keep follow-up with Dr. Berdine Addison.  Current medicines were reviewed with the patient today.   Orders Placed This Encounter  Procedures  . EKG 12-Lead    Disposition: FU with me in 1 year.   Signed, Satira Sark, MD, [facility] 07/04/2015 1:16 PM    [facility] [place], [place], [place] [address] Phone: [phone]; Fax: [phone]

## 2015-08-04 DIAGNOSIS — J449 Chronic obstructive pulmonary disease, unspecified: Secondary | ICD-10-CM | POA: Diagnosis not present

## 2015-08-04 DIAGNOSIS — G8929 Other chronic pain: Secondary | ICD-10-CM | POA: Diagnosis not present

## 2015-08-04 DIAGNOSIS — I252 Old myocardial infarction: Secondary | ICD-10-CM | POA: Diagnosis not present

## 2015-08-04 DIAGNOSIS — K219 Gastro-esophageal reflux disease without esophagitis: Secondary | ICD-10-CM | POA: Diagnosis not present

## 2015-08-04 DIAGNOSIS — I10 Essential (primary) hypertension: Secondary | ICD-10-CM | POA: Diagnosis not present

## 2015-08-04 DIAGNOSIS — Z7902 Long term (current) use of antithrombotics/antiplatelets: Secondary | ICD-10-CM | POA: Diagnosis not present

## 2015-08-04 DIAGNOSIS — E7800 Pure hypercholesterolemia, unspecified: Secondary | ICD-10-CM | POA: Diagnosis not present

## 2015-08-04 DIAGNOSIS — Z79899 Other long term (current) drug therapy: Secondary | ICD-10-CM | POA: Diagnosis not present

## 2015-08-04 DIAGNOSIS — M545 Low back pain: Secondary | ICD-10-CM | POA: Diagnosis not present

## 2015-08-22 ENCOUNTER — Emergency Department (HOSPITAL_COMMUNITY)
Admission: EM | Admit: 2015-08-22 | Discharge: 2015-08-22 | Disposition: A | Discharge status: Home / Self Care | Payer: Medicare Other | Attending: Emergency Medicine | Admitting: Emergency Medicine

## 2015-08-22 ENCOUNTER — Encounter (HOSPITAL_COMMUNITY): Payer: Self-pay | Admitting: Emergency Medicine

## 2015-08-22 DIAGNOSIS — Z86711 Personal history of pulmonary embolism: Secondary | ICD-10-CM | POA: Insufficient documentation

## 2015-08-22 DIAGNOSIS — M25551 Pain in right hip: Secondary | ICD-10-CM | POA: Insufficient documentation

## 2015-08-22 DIAGNOSIS — Z87891 Personal history of nicotine dependence: Secondary | ICD-10-CM | POA: Diagnosis not present

## 2015-08-22 DIAGNOSIS — M1990 Unspecified osteoarthritis, unspecified site: Secondary | ICD-10-CM | POA: Diagnosis not present

## 2015-08-22 DIAGNOSIS — Z79899 Other long term (current) drug therapy: Secondary | ICD-10-CM | POA: Insufficient documentation

## 2015-08-22 DIAGNOSIS — Z8639 Personal history of other endocrine, nutritional and metabolic disease: Secondary | ICD-10-CM | POA: Insufficient documentation

## 2015-08-22 DIAGNOSIS — G8929 Other chronic pain: Secondary | ICD-10-CM | POA: Diagnosis not present

## 2015-08-22 DIAGNOSIS — Z7952 Long term (current) use of systemic steroids: Secondary | ICD-10-CM | POA: Insufficient documentation

## 2015-08-22 DIAGNOSIS — M25562 Pain in left knee: Secondary | ICD-10-CM | POA: Insufficient documentation

## 2015-08-22 DIAGNOSIS — M25552 Pain in left hip: Secondary | ICD-10-CM | POA: Insufficient documentation

## 2015-08-22 DIAGNOSIS — R52 Pain, unspecified: Secondary | ICD-10-CM | POA: Diagnosis not present

## 2015-08-22 DIAGNOSIS — M549 Dorsalgia, unspecified: Secondary | ICD-10-CM | POA: Diagnosis not present

## 2015-08-22 DIAGNOSIS — Z87448 Personal history of other diseases of urinary system: Secondary | ICD-10-CM | POA: Diagnosis not present

## 2015-08-22 DIAGNOSIS — I2510 Atherosclerotic heart disease of native coronary artery without angina pectoris: Secondary | ICD-10-CM | POA: Diagnosis not present

## 2015-08-22 DIAGNOSIS — M25561 Pain in right knee: Secondary | ICD-10-CM | POA: Insufficient documentation

## 2015-08-22 DIAGNOSIS — Z7902 Long term (current) use of antithrombotics/antiplatelets: Secondary | ICD-10-CM | POA: Diagnosis not present

## 2015-08-22 DIAGNOSIS — D649 Anemia, unspecified: Secondary | ICD-10-CM | POA: Diagnosis not present

## 2015-08-22 MED ORDER — HYDROCODONE-ACETAMINOPHEN 7.5-325 MG PO TABS: 1.0000 | ORAL_TABLET | Freq: Four times a day (QID) | ORAL | Status: DC | PRN

## 2015-08-22 NOTE — ED Provider Notes (Signed)
CSN: [id number]     Arrival date & time 08/22/15  0903 History  By signing my name below, I, Alison [name], attest that this documentation has been prepared under the direction and in the presence of Alison Leigh, MD. Electronically Signed: Meriel [name], ED Scribe. 08/22/2015. 9:37 AM.   Chief Complaint  Patient presents with  . Generalized Body Aches    The history is provided by the patient and a relative. No language interpreter was used.   HPI Comments: Alison [name] is a 81 y.o. female, with a PMhx of osteoarthritis and spinal stenosis, who presents to the Emergency Department complaining of intermittent, gradually worsening diffuse arthralgias in back, knees, and hips that are chronic in nature but have been worsening over the past month. Per daughter, the pt is ambulatory with a walker and was sitting on the walker when she gently slid onto the floor 3 weeks ago. Pt denies LOC or falls within the past 24 hours. She reports that sliding out of the chair did not make her pain worse, however the pain has been gradually worsening without acute cause. Pt states she does not use her walker constantly to ambulate. She is taking prednisone and Norco as prescribed by her PCP Alison Beard, MD but has since run out of the [place]. Pt is also taking Plavix for PMhx of MI and DVT. No new overlying skin changes.   Past Medical History  Diagnosis Date  . Hyperlipidemia   . Coronary atherosclerosis of native coronary artery     Occluded circ with collaterals and 90% RCA (previous stents at [facility], details not clear), LVEF 48% 1/11 - inferior and inferolateral scar by Myoview 1/11,   . Monoclonal gammopathy of undetermined significance   . Renal insufficiency   . 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 Osteoarthritis   . History of hematuria   . Chronic anemia    Past Surgical History  Procedure Laterality Date  . Right  knee replacement  2011  . Left adrenalectomy  2009  . Right breast biopsy  2008   Family History  Problem Relation Age of Onset  . Stroke Father    Social History  Substance Use Topics  . Smoking status: Former Smoker -- 0.20 packs/day for 12 years    Types: Cigarettes    Quit date: 10/11/1988  . Smokeless tobacco: Never Used  . Alcohol Use: No   OB History    No data available     Review of Systems  Constitutional: Negative for fever.  Musculoskeletal: Positive for back pain and arthralgias (knees, hips).  Skin: Negative for color change and wound.  Neurological: Negative for syncope.  Hematological: Bruises/bleeds easily ( Plavix).  All other systems reviewed and are negative.  Allergies  Review of patient's allergies indicates no known allergies.  Home Medications   Prior to Admission medications   Medication Sig Start Date End Date Taking? Authorizing Provider  ADVAIR DISKUS 250-50 MCG/DOSE AEPB Inhale 1 Inhaler into the lungs Twice daily. 09/10/12   Historical Provider, MD  allopurinol (ZYLOPRIM) 100 MG tablet Take 100 mg by mouth daily.    Historical Provider, MD  clopidogrel (PLAVIX) 75 MG tablet 1TQ DAILY 05/05/15   Lendon Colonel, NP  CRESTOR 10 MG tablet TAKE 1 TABLET EVERY DAY 05/05/15   Lendon Colonel, NP  ferrous sulfate 325 (65 FE) MG tablet Take one by mouth twice  daily      Historical Provider, MD  furosemide (LASIX) 40 MG tablet TAKE 1 TABLET EVERY DAY 06/17/15   Lendon Colonel, NP  HYDROcodone-acetaminophen (NORCO) 7.5-325 MG per tablet Take 1 tablet by mouth every 6 (six) hours as needed for moderate pain.    Historical Provider, MD  losartan (COZAAR) 25 MG tablet TAKE 1 TABLET BY MOUTH EVERY DAY 05/05/15   Lendon Colonel, NP  metoprolol succinate (TOPROL-XL) 50 MG 24 hr tablet TAKE 1 TABLET EVERY DAY WITH OR IMMEDIATELY FOLLOWING A MEAL 05/05/15   Lendon Colonel, NP  nitroGLYCERIN (NITROSTAT) 0.4 MG SL tablet Place 0.4 mg under the tongue  every 5 (five) minutes as needed.      Historical Provider, MD  predniSONE (DELTASONE) 5 MG tablet Take 5 mg by mouth daily with breakfast. Taking 2 in AM 1 at lunch 1 at supper and 2 at bedtime    Historical Provider, MD   BP 123/79 mmHg  Pulse 76  Temp(Src) 97.9 F (36.6 C) (Oral)  Resp 16  Ht 5' 1.5" (1.562 m)  Wt 235 lb (106.595 kg)  BMI 43.69 kg/m2  SpO2 97% Physical Exam  Constitutional: She is oriented to person, place, and time. She appears well-developed and well-nourished.  Non-toxic appearance. No distress.  HENT:  Head: Normocephalic and atraumatic.  Eyes: Conjunctivae, EOM and lids are normal. Pupils are equal, round, and reactive to light.  Neck: Normal range of motion. Neck supple. No tracheal deviation present. No thyroid mass present.  Cardiovascular: Normal rate, regular rhythm and normal heart sounds.  Exam reveals no gallop.   No murmur heard. Pulmonary/Chest: Effort normal and breath sounds normal. No stridor. No respiratory distress. She has no decreased breath sounds. She has no wheezes. She has no rhonchi. She has no rales.  Abdominal: Soft. Normal appearance and bowel sounds are normal. She exhibits no distension. There is no tenderness. There is no rebound and no CVA tenderness.  Musculoskeletal: Normal range of motion. She exhibits no edema or tenderness.  Non tender along the lumbar sacral spine.  Neurological: She is alert and oriented to person, place, and time. She has normal strength. No cranial nerve deficit or sensory deficit. GCS eye subscore is 4. GCS verbal subscore is 5. GCS motor subscore is 6.  Skin: Skin is warm and dry. No abrasion and no rash noted.  Psychiatric: She has a normal mood and affect. Her speech is normal and behavior is normal.  Nursing note and vitals reviewed.   ED Course  Procedures  DIAGNOSTIC STUDIES: Oxygen Saturation is 97% on RA, normal by my interpretation.    COORDINATION OF CARE: 9:36 AM Discussed treatment plan  with pt at bedside and pt agreed to plan.  MDM   Final diagnoses:  None    I personally performed the services described in this documentation, which was scribed in my presence. The recorded information has been reviewed and is accurate.   Will refill meds for her pain pills. No acute abnormalities noted here. Patient did fall 3 weeks ago but she slid out of bed and has no acute injuries from that. No bowel or bladder dysfunction. Patient encouraged to follow-up with her neurosurgeon   Alison Leigh, MD 08/22/15 [phone]

## 2015-08-22 NOTE — ED Notes (Signed)
Pt c/o of on-going pain to bilateral legs, feet, buttocks, and lower back. Pt reports she sees a PCP and is being treated, but he is out of town. Pt ambulatory. Denies injury/fall.

## 2015-08-22 NOTE — ED Notes (Signed)
Pt report chronic low back/ bilateral hip/thigh/ knee pain. Pt reports any acute injuries or sudden worsening of symptoms.

## 2015-08-22 NOTE — Discharge Instructions (Signed)
Follow-up with your neurosurgeon or primary care doctor. Return here at once for bowel or bladder dysfunction, new weakness to your legs, or any other problems

## 2015-08-27 DIAGNOSIS — R531 Weakness: Secondary | ICD-10-CM | POA: Diagnosis not present

## 2015-08-27 DIAGNOSIS — M545 Low back pain: Secondary | ICD-10-CM | POA: Diagnosis not present

## 2015-08-27 DIAGNOSIS — Z6837 Body mass index (BMI) 37.0-37.9, adult: Secondary | ICD-10-CM | POA: Diagnosis not present

## 2015-09-12 DIAGNOSIS — M5416 Radiculopathy, lumbar region: Secondary | ICD-10-CM | POA: Diagnosis not present

## 2015-10-29 ENCOUNTER — Other Ambulatory Visit: Payer: Self-pay | Admitting: Adult Health

## 2015-11-03 DIAGNOSIS — M545 Low back pain: Secondary | ICD-10-CM | POA: Diagnosis not present

## 2015-11-17 DIAGNOSIS — M4806 Spinal stenosis, lumbar region: Secondary | ICD-10-CM | POA: Diagnosis not present

## 2015-11-19 DIAGNOSIS — M4806 Spinal stenosis, lumbar region: Secondary | ICD-10-CM | POA: Diagnosis not present

## 2015-12-02 DIAGNOSIS — M4806 Spinal stenosis, lumbar region: Secondary | ICD-10-CM | POA: Diagnosis not present

## 2015-12-17 DIAGNOSIS — Z23 Encounter for immunization: Secondary | ICD-10-CM | POA: Diagnosis not present

## 2015-12-17 DIAGNOSIS — M4806 Spinal stenosis, lumbar region: Secondary | ICD-10-CM | POA: Diagnosis not present

## 2016-01-24 ENCOUNTER — Other Ambulatory Visit: Payer: Self-pay | Admitting: Adult Health

## 2016-01-27 ENCOUNTER — Ambulatory Visit (HOSPITAL_COMMUNITY): Payer: Medicare Other | Attending: Orthopedic Surgery | Admitting: Physical Therapy

## 2016-01-27 DIAGNOSIS — M6281 Muscle weakness (generalized): Secondary | ICD-10-CM | POA: Diagnosis not present

## 2016-01-27 DIAGNOSIS — R2681 Unsteadiness on feet: Secondary | ICD-10-CM | POA: Diagnosis not present

## 2016-01-27 DIAGNOSIS — M5441 Lumbago with sciatica, right side: Secondary | ICD-10-CM | POA: Insufficient documentation

## 2016-01-27 DIAGNOSIS — R293 Abnormal posture: Secondary | ICD-10-CM | POA: Diagnosis not present

## 2016-01-27 DIAGNOSIS — M5442 Lumbago with sciatica, left side: Secondary | ICD-10-CM | POA: Insufficient documentation

## 2016-01-27 NOTE — Therapy (Signed)
[place], [place], [address] Phone: [phone]   Fax:  [phone]  Physical Therapy Evaluation  Patient Details  Name: Alison [name] MRN: [id number] Date of Birth: 1931-10-09 Referring Provider: Phylliss Bob  Encounter Date: 01/27/2016      PT End of Session - 01/27/16 1615    Visit Number 1   Number of Visits 16   Date for PT Re-Evaluation 02/26/16   Authorization Type [employer] medicare   Authorization - Visit Number 1   Authorization - Number of Visits 10   PT Start Time [id number]   PT Stop Time 1608   PT Time Calculation (min) 43 min   Activity Tolerance Patient tolerated treatment well   Behavior During Therapy [facility] for tasks assessed/performed      Past Medical History  Diagnosis Date  . Hyperlipidemia   . Coronary atherosclerosis of native coronary artery     Occluded circ with collaterals and 90% RCA (previous stents at [facility], details not clear), LVEF 48% 1/11 - inferior and inferolateral scar by Myoview 1/11,   . Monoclonal gammopathy of undetermined significance   . Renal insufficiency   . 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 Osteoarthritis   . History of hematuria   . Chronic anemia     Past Surgical History  Procedure Laterality Date  . Right knee replacement  2011  . Left adrenalectomy  2009  . Right breast biopsy  2008    There were no vitals filed for this visit.       Subjective Assessment - 01/27/16 1543    Subjective Alison [name] states that she has had low back pain for years but she has been having increased pain for the past several months.  She has to use a wheelchair if she is going out due to not being able to walk longer than a few minutes.  She has had an injection in her back in March that assisted with her pain for seven days but then the pain returned.  She currently states that she has pain in  her buttock, thighs, knees and calves.  She states that her back just feels like it gives out.  She has been referred to skilled physical therapy to attempt to improve her pain and functional ability.    Limitations Standing;Walking;House hold activities   How long can you sit comfortably? no problem    How long can you stand comfortably? less than five minues    How long can you walk comfortably? less than two minutes.    Patient Stated Goals decreased pain and to be able to walk normal    Currently in Pain? Yes   Pain Score 10-Worst pain ever   Pain Location Buttocks   Pain Orientation Right;Left   Pain Descriptors / Indicators Stabbing   Pain Type Chronic pain   Pain Radiating Towards down to her feet    Pain Onset More than a month ago   Pain Frequency Constant   Aggravating Factors  standing    Pain Relieving Factors sitting, tramadol             [facility] PT Assessment - 01/27/16 0001    Assessment   Medical Diagnosis Lumbar spinal stenosis    Referring Provider Phylliss Bob   Onset Date/Surgical Date 11/17/15   Next MD Visit after therapy    Prior Therapy  none    Precautions   Precautions None   Restrictions   Weight Bearing Restrictions No   Balance Screen   Has the patient fallen in the past 6 months No   Has the patient had a decrease in activity level because of a fear of falling?  Yes   Is the patient reluctant to leave their home because of a fear of falling?  Yes   [place] residence   Living Arrangements Children  son   Type of [place] to enter   Entrance Stairs-Number of Steps 2   Entrance Stairs-Rails Right   Prior Function   Level of Independence Independent with basic ADLs   Vocation Retired   Leisure Pharmacist, community, go to church    Cognition   Overall Cognitive Status Within Functional Limits for tasks assessed   Observation/Other Assessments   Focus on Therapeutic Outcomes (FOTO)  47    Functional Tests   Functional tests Single leg stance;Sit to Stand   Single Leg Stance   Comments unable    Sit to Stand   Comments very painful able to do without UE assist    Posture/Postural Control   Posture/Postural Control Postural limitations   Postural Limitations Rounded Shoulders;Forward head;Decreased lumbar lordosis;Increased thoracic kyphosis   Posture Comments Pt stands with hips flexed to 20 degrees.    ROM / Strength   AROM / PROM / Strength AROM;Strength   AROM   AROM Assessment Site Hip;Knee;Ankle   Right/Left Hip Right;Left   Right/Left Knee Right;Left   Right/Left Ankle Right;Left   Strength   Strength Assessment Site Hip;Knee;Ankle   Right/Left Hip Right;Left   Right Hip Flexion 3/5   Right Hip Extension 2/5   Right Hip ABduction 3/5   Left Hip Flexion 3/5   Left Hip Extension 2/5   Left Hip ABduction 3/5   Right/Left Knee Right;Left   Right Knee Flexion 5/5   Right Knee Extension 5/5   Left Knee Flexion 5/5   Left Knee Extension 5/5   Flexibility   Soft Tissue Assessment /Muscle Length yes   Hamstrings Lt 148; Rt 140                    [facility] Adult PT Treatment/Exercise - 01/27/16 0001    Exercises   Exercises Lumbar   Lumbar Exercises: Stretches   Active Hamstring Stretch 3 reps;20 seconds   Lumbar Exercises: Supine   Ab Set 10 reps   Glut Set 10 reps   Other Supine Lumbar Exercises hip adduction isometric x 10                 PT Education - 01/27/16 1613    Education provided Yes   Education Details HEP    Person(s) Educated Patient   Methods Explanation;Demonstration;Handout;Tactile cues   Comprehension Verbalized understanding;Returned demonstration          PT Short Term Goals - 01/27/16 1629    PT SHORT TERM GOAL #1   Title Patient core and bilateral hip strength to be improved 1/2 degree to allow patient to walk for ten minutes without pain increasing beyond 5/10    Time 4   Period Weeks   Status New   PT  SHORT TERM GOAL #2   Title Pt to be able to stand in a fully erect position to allow decreased stress on low back musculature.    Time 4  Period Weeks   Status New   PT SHORT TERM GOAL #3   Title Pt to be able to verbalize the importance of posture and core strength in back care.    Time 4   Period Weeks   Status New   PT SHORT TERM GOAL #4   Title Pt to be able to stand for ten minutes with pain no greater than a 5/10 to be able to complete self grooming while standing.    Time 4   Period Weeks           PT Long Term Goals - 01/27/16 1633    PT LONG TERM GOAL #1   Title Pt core bilateral lower extremity strength to be increased one grade to allow patient to come sit to stand without  increased pain   Time 6   Period Weeks   PT LONG TERM GOAL #2   Title Patient core and bilateral lower extremity strength to be increased a grade and a half to allow pt to go up and down four steps in a reciprocal manner.    Time 8   Period Weeks   Status New   PT LONG TERM GOAL #3   Title Pt to be able to ambulate for 15 minutes without her pain increasing above a 5/10 to allow pt to go to church service    Time 8   Period Weeks   Status New   PT LONG TERM GOAL #4   Title Pt to be able to single leg stance for five seconds on both Legs to reduce risk for falls    Time 8   Period Weeks   Status New               Plan - 01/27/16 1616    Clinical Impression Statement Alison [name] is an 82 yo female who has had chronic back pain with acute exacerbation since February.  She noticed that since February her pain has been so severe that she has not been able to walk for more than a few minutes. She is also having to take more pain medication which just seems to cause her to sleep.   An MRI shows spinal stenosis.  The MD does not want to operate if possible, therefore, he is sending Alison [name] to physical therapy to attempt to decrease her pain and improve her functional ability.   Evaluation demonstrates decreased balance,  forward flexed standing position, tight leg mm, weakened core and hip mm and decreased activity tolerance.  Alison [name] wiill benefit from skilled physical therapy to address these issues and improve her functional ability.     Rehab Potential Fair   PT Frequency 2x / week   PT Duration 8 weeks   PT Treatment/Interventions ADLs/Self Care Home Management;Therapeutic activities;Therapeutic exercise;Patient/family education;Manual techniques;Electrical Stimulation;Traction;Ultrasound   PT Next Visit Plan Begin bent knee raise, clam and side lying hip abduction.  Keep back in neutral and progress to prone exercises    PT Home Exercise Plan given    Consulted and Agree with Plan of Care Patient      Patient will benefit from skilled therapeutic intervention in order to improve the following deficits and impairments:  Abnormal gait, Decreased activity tolerance, Decreased balance, Decreased range of motion, Decreased strength, Difficulty walking, Pain  Visit Diagnosis: Lumbago with sciatica, right side - Plan: PT plan of care cert/re-cert  Lumbago with sciatica, left side - Plan: PT plan of care cert/re-cert  Unsteadiness on feet -  Plan: PT plan of care cert/re-cert  Abnormal posture - Plan: PT plan of care cert/re-cert  Muscle weakness (generalized) - Plan: PT plan of care cert/re-cert      G-Codes - [id number]    Functional Limitation Changing and maintaining body position   Changing and Maintaining Body Position Current Status [id number]) At least 40 percent but less than 60 percent impaired, limited or restricted   Changing and Maintaining Body Position Goal Status [id number]) At least 40 percent but less than 60 percent impaired, limited or restricted       Problem List Patient Active Problem List   Diagnosis Date Noted  . Hypocalcemia 09/03/2013  . Leg edema 04/06/2013  . History of pulmonary embolus (PE) 01/05/2011  .  HYPERLIPIDEMIA-MIXED 06/24/2009  . CAD, NATIVE VESSEL 06/24/2009    Rayetta Humphrey, PT CLT [phone] 01/27/2016, 4:44 PM  [facility] [address] [place], [place], [address] Phone: [phone]   Fax:  [phone]  Name: Alison [name] MRN: [id number] Date of Birth: August 03, 1931

## 2016-01-27 NOTE — Patient Instructions (Signed)
Back Strengthener    Sit erect, hands on armrests, large pillow behind back. Lean back, compressing pillow. Hold _3__ seconds. Repeat __10_ times. Do __3_ sessions per day. If balance ok, place arms across chest.  Copyright  VHI. All rights reserved.  Adduction With Resistance   Use a pillow between your legs and squeeze together.  . Squeeze legs together while resisting with hands for _5__ seconds. Repeat _10__ times. Do __4_ sessions per day. Note: If possible, place feet on floor.  Copyright  VHI. All rights reserved.  Gluteal Sets   May do sitting  Tighten buttocks while pressing pelvis to floor. Hold _3-5___ seconds. Repeat _10___ times per set. Do __1__ sets per session. Do _2___ sessions per day.  http://orth.exer.us/104   Copyright  VHI. All rights reserved.  Hamstring Stretch: Active    Support behind right knee. Starting with knee bent, attempt to straighten knee until a comfortable stretch is felt in back of thigh. Hold _20___ seconds. Repeat _3___ times per set. Do _1___ sets per session. Do __2__ sessions per day.  http://orth.exer.us/158   Copyright  VHI. All rights reserved.  Isometric Abdominal   May do sitting  Lying on back with knees bent, tighten stomach by pressing elbows down. Hold __3-5__ seconds. Repeat __1-__ times per set. Do __1__ sets per session. Do __5__ sessions per day.  http://orth.exer.us/1086   Copyright  VHI. All rights reserved.

## 2016-01-28 ENCOUNTER — Ambulatory Visit (HOSPITAL_COMMUNITY): Payer: Medicare Other

## 2016-01-28 DIAGNOSIS — R2681 Unsteadiness on feet: Secondary | ICD-10-CM

## 2016-01-28 DIAGNOSIS — R293 Abnormal posture: Secondary | ICD-10-CM

## 2016-01-28 DIAGNOSIS — M6281 Muscle weakness (generalized): Secondary | ICD-10-CM

## 2016-01-28 DIAGNOSIS — M5442 Lumbago with sciatica, left side: Secondary | ICD-10-CM

## 2016-01-28 DIAGNOSIS — M5441 Lumbago with sciatica, right side: Secondary | ICD-10-CM

## 2016-01-28 NOTE — Therapy (Signed)
[place], [place], [address] Phone: [phone]   Fax:  [phone]  Physical Therapy Treatment  Patient Details  Name: Alison [name] MRN: [id number] Date of Birth: 1931-09-07 Referring Provider: Phylliss Bob  Encounter Date: 01/28/2016      PT End of Session - 01/28/16 1704    Visit Number 2   Number of Visits [place] - Visit Number 2   Authorization - Number of Visits 10   PT Start Time [id number]   PT Stop Time 1728   PT Time Calculation (min) 38 min   Activity Tolerance Patient tolerated treatment well   Behavior During Therapy [facility] for tasks assessed/performed      Past Medical History  Diagnosis Date  . Hyperlipidemia   . Coronary atherosclerosis of native coronary artery     Occluded circ with collaterals and 90% RCA (previous stents at [facility], details not clear), LVEF 48% 1/11 - inferior and inferolateral scar by Myoview 1/11,   . Monoclonal gammopathy of undetermined significance   . Renal insufficiency   . 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 Osteoarthritis   . History of hematuria   . Chronic anemia     Past Surgical History  Procedure Laterality Date  . Right knee replacement  2011  . Left adrenalectomy  2009  . Right breast biopsy  2008    There were no vitals filed for this visit.      Subjective Assessment - 01/28/16 1656    Subjective Pt stated she has severe pain down Bil buttock and Bil LE ending at ankles, pain scale 10/10.  Reports understanding of HEP she was given yesterday.   Patient Stated Goals decreased pain and to be able to walk normal    Currently in Pain? Yes   Pain Score 10-Worst pain ever   Pain Location Buttocks   Pain Orientation Right;Left   Pain Descriptors / Indicators Stabbing;Aching;Sore   Pain Type Chronic pain   Pain Radiating  Towards Down Bil LE ending at ankle, no pain in feet   Pain Onset More than a month ago   Pain Frequency Constant   Aggravating Factors  standing   Pain Relieving Factors sitting, tramadol                         [facility] Adult PT Treatment/Exercise - 01/28/16 0001    Lumbar Exercises: Stretches   Active Hamstring Stretch 3 reps;30 seconds   Active Hamstring Stretch Limitations supine with towel   Lumbar Exercises: Supine   Ab Set 10 reps;5 seconds   Glut Set 10 reps;5 seconds   Clam 10 reps   Bent Knee Raise 10 reps;5 seconds   Other Supine Lumbar Exercises Education on proper posture   Lumbar Exercises: Sidelying   Hip Abduction 10 reps                PT Education - 01/27/16 1613    Education provided Yes   Education Details HEP    Person(s) Educated Patient   Methods Explanation;Demonstration;Handout;Tactile cues   Comprehension Verbalized understanding;Returned demonstration          PT Short Term Goals - 01/27/16 1629    PT SHORT TERM GOAL #1   Title Patient core and bilateral hip strength to be improved 1/2  degree to allow patient to walk for ten minutes without pain increasing beyond 5/10    Time 4   Period Weeks   Status New   PT SHORT TERM GOAL #2   Title Pt to be able to stand in a fully erect position to allow decreased stress on low back musculature.    Time 4   Period Weeks   Status New   PT SHORT TERM GOAL #3   Title Pt to be able to verbalize the importance of posture and core strength in back care.    Time 4   Period Weeks   Status New   PT SHORT TERM GOAL #4   Title Pt to be able to stand for ten minutes with pain no greater than a 5/10 to be able to complete self grooming while standing.    Time 4   Period Weeks           PT Long Term Goals - Feb 24, 2016 1633    PT LONG TERM GOAL #1   Title Pt core bilateral lower extremity strength to be increased one grade to allow patient to come sit to stand without  increased pain    Time 6   Period Weeks   PT LONG TERM GOAL #2   Title Patient core and bilateral lower extremity strength to be increased a grade and a half to allow pt to go up and down four steps in a reciprocal manner.    Time 8   Period Weeks   Status New   PT LONG TERM GOAL #3   Title Pt to be able to ambulate for 15 minutes without her pain increasing above a 5/10 to allow pt to go to church service    Time 8   Period Weeks   Status New   PT LONG TERM GOAL #4   Title Pt to be able to single leg stance for five seconds on both Legs to reduce risk for falls    Time 8   Period Weeks   Status New               Plan - 01/28/16 1707    Clinical Impression Statement Reviewed goals, compliance and assured correct technique with HEP with min verbal and tactile cueing for correct form, and copy of eval given to pt.  Session focus on core strengthening to improve stability for pain control.   Therapist facilitation for proper form and technqiue with all exercises through session.  Pt educated on importance of proper posture to reduce stress of spinal  musculature with verbal, tactile and visual spinal cord   Rehab Potential Fair   PT Frequency 2x / week   PT Duration 8 weeks   PT Treatment/Interventions ADLs/Self Care Home Management;Therapeutic activities;Therapeutic exercise;Patient/family education;Manual techniques;Electrical Stimulation;Traction;Ultrasound   PT Next Visit Plan Continue core stability exercises.  Keep back in neutral and progress to prone exercises       Patient will benefit from skilled therapeutic intervention in order to improve the following deficits and impairments:  Abnormal gait, Decreased activity tolerance, Decreased balance, Decreased range of motion, Decreased strength, Difficulty walking, Pain  Visit Diagnosis: Lumbago with sciatica, right side  Lumbago with sciatica, left side  Unsteadiness on feet  Abnormal posture  Muscle weakness (generalized)        G-Codes - 02-24-16 1640    Functional Limitation Changing and maintaining body position   Changing and Maintaining Body Position Current Status [id number]) At least 40 percent  but less than 60 percent impaired, limited or restricted   Changing and Maintaining Body Position Goal Status [id number]) At least 40 percent but less than 60 percent impaired, limited or restricted      Problem List Patient Active Problem List   Diagnosis Date Noted  . Hypocalcemia 09/03/2013  . Leg edema 04/06/2013  . History of pulmonary embolus (PE) 01/05/2011  . HYPERLIPIDEMIA-MIXED 06/24/2009  . CAD, NATIVE VESSEL 06/24/2009   Ihor Austin, [place]; [place]  [name] 01/28/2016, 5:38 PM  [place], [place], [address] Phone: [phone]   Fax:  [phone]  Name: Alison [name] MRN: [id number] Date of Birth: 01/23/1931

## 2016-02-03 ENCOUNTER — Ambulatory Visit (HOSPITAL_COMMUNITY): Payer: Medicare Other

## 2016-02-03 DIAGNOSIS — R293 Abnormal posture: Secondary | ICD-10-CM

## 2016-02-03 DIAGNOSIS — M5441 Lumbago with sciatica, right side: Secondary | ICD-10-CM | POA: Diagnosis not present

## 2016-02-03 DIAGNOSIS — R2681 Unsteadiness on feet: Secondary | ICD-10-CM | POA: Diagnosis not present

## 2016-02-03 DIAGNOSIS — M6281 Muscle weakness (generalized): Secondary | ICD-10-CM

## 2016-02-03 DIAGNOSIS — M5442 Lumbago with sciatica, left side: Secondary | ICD-10-CM | POA: Diagnosis not present

## 2016-02-03 NOTE — Therapy (Signed)
Alison [name], [place], [address] Phone: [phone]   Fax:  [phone]  Physical Therapy Treatment  Patient Details  Name: CHANEY [name] MRN: [id number] Date of Birth: Feb 20, 1931 Referring Provider: Phylliss Bob  Encounter Date: 02/03/2016      PT End of Session - 02/03/16 1449    Visit Number 3   Number of Visits 16   Date for PT Re-Evaluation 02/26/16   Authorization Type [employer] medicare   Authorization - Visit Number 3   Authorization - Number of Visits 10   PT Start Time [id number]   PT Stop Time [id number]   PT Time Calculation (min) 42 min   Activity Tolerance Patient tolerated treatment well;No increased pain   Behavior During Therapy [facility] for tasks assessed/performed      Past Medical History  Diagnosis Date  . Hyperlipidemia   . Coronary atherosclerosis of native coronary artery     Occluded circ with collaterals and 90% RCA (previous stents at [facility], details not clear), LVEF 48% 1/11 - inferior and inferolateral scar by Myoview 1/11,   . Monoclonal gammopathy of undetermined significance   . Renal insufficiency   . 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 Osteoarthritis   . History of hematuria   . Chronic anemia     Past Surgical History  Procedure Laterality Date  . Right knee replacement  2011  . Left adrenalectomy  2009  . Right breast biopsy  2008    There were no vitals filed for this visit.      Subjective Assessment - 02/03/16 1439    Subjective Pt stated she has severe pain in buttocks and Bil LE, pain scale 10/10 ending at ankles.  Reports she does not need to go to ER today.  Reports compliance HEP daily.   Patient Stated Goals decreased pain and to be able to walk normal    Currently in Pain? Yes   Pain Score 10-Worst pain ever   Pain Location Buttocks   Pain Orientation Right;Left   Pain Descriptors /  Indicators Sore;Aching;Stabbing   Pain Type Chronic pain   Pain Radiating Towards Down Bil LE ending at ankle, no pain in feet   Pain Onset More than a month ago   Pain Frequency Constant   Aggravating Factors  standing   Pain Relieving Factors sitting, tramadol          [facility] Adult PT Treatment/Exercise - 02/03/16 0001    Lumbar Exercises: Stretches   Active Hamstring Stretch 3 reps;30 seconds   Active Hamstring Stretch Limitations supine with towel   Single Knee to Chest Stretch 3 reps;30 seconds   Lumbar Exercises: Supine   Ab Set 10 reps;5 seconds   Glut Set 10 reps;5 seconds   Bent Knee Raise 10 reps;5 seconds   Other Supine Lumbar Exercises Decompression exercises 1-5 supine    Lumbar Exercises: Sidelying   Clam 5 reps   Hip Abduction 10 reps            PT Short Term Goals - 01/27/16 1629    PT SHORT TERM GOAL #1   Title Patient core and bilateral hip strength to be improved 1/2 degree to allow patient to walk for ten minutes without pain increasing beyond 5/10    Time 4   Period Weeks   Status New   PT SHORT TERM GOAL #2  Title Pt to be able to stand in a fully erect position to allow decreased stress on low back musculature.    Time 4   Period Weeks   Status New   PT SHORT TERM GOAL #3   Title Pt to be able to verbalize the importance of posture and core strength in back care.    Time 4   Period Weeks   Status New   PT SHORT TERM GOAL #4   Title Pt to be able to stand for ten minutes with pain no greater than a 5/10 to be able to complete self grooming while standing.    Time 4   Period Weeks           PT Long Term Goals - 01/27/16 1633    PT LONG TERM GOAL #1   Title Pt core bilateral lower extremity strength to be increased one grade to allow patient to come sit to stand without  increased pain   Time 6   Period Weeks   PT LONG TERM GOAL #2   Title Patient core and bilateral lower extremity strength to be increased a grade and a half to allow  pt to go up and down four steps in a reciprocal manner.    Time 8   Period Weeks   Status New   PT LONG TERM GOAL #3   Title Pt to be able to ambulate for 15 minutes without her pain increasing above a 5/10 to allow pt to go to church service    Time 8   Period Weeks   Status New   PT LONG TERM GOAL #4   Title Pt to be able to single leg stance for five seconds on both Legs to reduce risk for falls    Time 8   Period Weeks   Status New               Plan - 02/03/16 1455    Clinical Impression Statement Pt limited by high levels of lower back and Bil LE pain.  Began session with decompression exercises to improve tolerance for supine position, to improve lengthening of back and LE musculature and core/postural strengthening.  Continued session focus on improving core stabiltiy for pain control.  Therapist facilitation requiired for proper form and technqiues with majority of exercises to assure correct spinal alignment and to reduce compensation with therex.  End of session pt reports pain reduced to 5/10.   Rehab Potential Fair   PT Frequency 2x / week   PT Duration 8 weeks   PT Treatment/Interventions ADLs/Self Care Home Management;Therapeutic activities;Therapeutic exercise;Patient/family education;Manual techniques;Electrical Stimulation;Traction;Ultrasound   PT Next Visit Plan Continue core stability exercises.  Keep back in neutral and progress to prone exercises    PT Home Exercise Plan Decompression exercise handout given (reviewed visual pictures on handout)      Patient will benefit from skilled therapeutic intervention in order to improve the following deficits and impairments:  Abnormal gait, Decreased activity tolerance, Decreased balance, Decreased range of motion, Decreased strength, Difficulty walking, Pain  Visit Diagnosis: Lumbago with sciatica, right side  Lumbago with sciatica, left side  Unsteadiness on feet  Abnormal posture  Muscle weakness  (generalized)     Problem List Patient Active Problem List   Diagnosis Date Noted  . Hypocalcemia 09/03/2013  . Leg edema 04/06/2013  . History of pulmonary embolus (PE) 01/05/2011  . HYPERLIPIDEMIA-MIXED 06/24/2009  . CAD, NATIVE VESSEL 06/24/2009   Ihor Austin, LPTA; CBIS  [phone]  [name] 02/03/2016, 3:32 PM  [place] [address] [place], [place], [address] Phone: [phone]   Fax:  [phone]  Name: Alison [name] MRN: [id number] Date of Birth: 1931/01/04

## 2016-02-06 ENCOUNTER — Ambulatory Visit (HOSPITAL_COMMUNITY): Payer: Medicare Other

## 2016-02-06 DIAGNOSIS — M5442 Lumbago with sciatica, left side: Secondary | ICD-10-CM | POA: Diagnosis not present

## 2016-02-06 DIAGNOSIS — M5441 Lumbago with sciatica, right side: Secondary | ICD-10-CM | POA: Diagnosis not present

## 2016-02-06 DIAGNOSIS — R2681 Unsteadiness on feet: Secondary | ICD-10-CM | POA: Diagnosis not present

## 2016-02-06 DIAGNOSIS — R293 Abnormal posture: Secondary | ICD-10-CM

## 2016-02-06 DIAGNOSIS — M6281 Muscle weakness (generalized): Secondary | ICD-10-CM | POA: Diagnosis not present

## 2016-02-06 NOTE — Therapy (Signed)
[place], [place], [address] Phone: [phone]   Fax:  [phone]  Physical Therapy Treatment  Patient Details  Name: Alison [name] MRN: [id number] Date of Birth: 05-30-1931 Referring Provider: Phylliss Bob  Encounter Date: 02/06/2016      PT End of Session - 02/06/16 1312    Visit Number 4   Number of Visits 16   Date for PT Re-Evaluation 02/26/16   Authorization Type [place] - Visit Number 4   Authorization - Number of Visits 10   PT Start Time 1300   PT Stop Time [id number]   PT Time Calculation (min) 48 min   Activity Tolerance Patient tolerated treatment well;No increased pain   Behavior During Therapy [facility] for tasks assessed/performed      Past Medical History  Diagnosis Date  . Hyperlipidemia   . Coronary atherosclerosis of native coronary artery     Occluded circ with collaterals and 90% RCA (previous stents at [facility], details not clear), LVEF 48% 1/11 - inferior and inferolateral scar by Myoview 1/11,   . Monoclonal gammopathy of undetermined significance   . Renal insufficiency   . 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 Osteoarthritis   . History of hematuria   . Chronic anemia     Past Surgical History  Procedure Laterality Date  . Right knee replacement  2011  . Left adrenalectomy  2009  . Right breast biopsy  2008    There were no vitals filed for this visit.      Subjective Assessment - 02/06/16 1252    Subjective Pt entered dept with smile on face, stated she was feeling good today with no reports of pain, has taken pain medication prior session.   Patient Stated Goals decreased pain and to be able to walk normal    Currently in Pain? No/denies   Pain Radiating Towards no reports of radicular symptoms today   Pain Onset More than a month ago   Pain Relieving Factors sitting, tramadol                          [facility] Adult PT Treatment/Exercise - 02/06/16 0001    Lumbar Exercises: Stretches   Active Hamstring Stretch 3 reps;30 seconds   Active Hamstring Stretch Limitations supine with UE   Single Knee to Chest Stretch 3 reps;30 seconds   Lumbar Exercises: Standing   Other Standing Lumbar Exercises Standing x 2 min with RW, educated on importance of posture to reduce strain on lumbar musculature   Lumbar Exercises: Supine   Bent Knee Raise 10 reps;5 seconds   Bridge 10 reps   Bridge Limitations partial   Other Supine Lumbar Exercises Decompression exercises with theraband 5 reps each   Lumbar Exercises: Sidelying   Hip Abduction 10 reps   Lumbar Exercises: Prone   Other Prone Lumbar Exercises heel squeeze 10x 5"                  PT Short Term Goals - 01/27/16 1629    PT SHORT TERM GOAL #1   Title Patient core and bilateral hip strength to be improved 1/2 degree to allow patient to walk for ten minutes without pain increasing beyond 5/10    Time 4   Period Weeks   Status New   PT SHORT TERM  GOAL #2   Title Pt to be able to stand in a fully erect position to allow decreased stress on low back musculature.    Time 4   Period Weeks   Status New   PT SHORT TERM GOAL #3   Title Pt to be able to verbalize the importance of posture and core strength in back care.    Time 4   Period Weeks   Status New   PT SHORT TERM GOAL #4   Title Pt to be able to stand for ten minutes with pain no greater than a 5/10 to be able to complete self grooming while standing.    Time 4   Period Weeks           PT Long Term Goals - 01/27/16 1633    PT LONG TERM GOAL #1   Title Pt core bilateral lower extremity strength to be increased one grade to allow patient to come sit to stand without  increased pain   Time 6   Period Weeks   PT LONG TERM GOAL #2   Title Patient core and bilateral lower extremity strength to be increased a grade and a half to  allow pt to go up and down four steps in a reciprocal manner.    Time 8   Period Weeks   Status New   PT LONG TERM GOAL #3   Title Pt to be able to ambulate for 15 minutes without her pain increasing above a 5/10 to allow pt to go to church service    Time 8   Period Weeks   Status New   PT LONG TERM GOAL #4   Title Pt to be able to single leg stance for five seconds on both Legs to reduce risk for falls    Time 8   Period Weeks   Status New               Plan - 02/06/16 1323    Clinical Impression Statement Session focus on improving core stabiltiy and proximal musculature strengthening.  Progressed to decompression exercise with theraband for core strengthening and gluteal strengthening with partial bridges and prone heel squeeze.  Pt able to demonstrate exercises with therapist facilitaiton for proper form and technqiue with no reports of increased pain through session.  Began education on importance of proper posture with standing to reduce stress of spinal musculature, pt able to stand for 2 minutes with verbal and tactile cueing for posture.  No reports of pain at end of session.     Rehab Potential Fair   PT Frequency 2x / week   PT Duration 8 weeks   PT Treatment/Interventions ADLs/Self Care Home Management;Therapeutic activities;Therapeutic exercise;Patient/family education;Manual techniques;Electrical Stimulation;Traction;Ultrasound   PT Next Visit Plan [place] core stabiltiy exercises, keep back in neutral.  Begin prone on elbows next session.      Patient will benefit from skilled therapeutic intervention in order to improve the following deficits and impairments:  Abnormal gait, Decreased activity tolerance, Decreased balance, Decreased range of motion, Decreased strength, Difficulty walking, Pain  Visit Diagnosis: Lumbago with sciatica, right side  Lumbago with sciatica, left side  Unsteadiness on feet  Abnormal posture  Muscle weakness  (generalized)     Problem List Patient Active Problem List   Diagnosis Date Noted  . Hypocalcemia 09/03/2013  . Leg edema 04/06/2013  . History of pulmonary embolus (PE) 01/05/2011  . HYPERLIPIDEMIA-MIXED 06/24/2009  . CAD, NATIVE VESSEL 06/24/2009   Ihor Austin,  LPTA; CBIS [phone]   [name] 02/06/2016, 5:17 PM  [place], [place], [address] Phone: [phone]   Fax:  [phone]  Name: Alison [name] MRN: [id number] Date of Birth: 26-Aug-1931

## 2016-02-10 ENCOUNTER — Ambulatory Visit (HOSPITAL_COMMUNITY): Payer: Medicare Other | Attending: Orthopedic Surgery

## 2016-02-10 DIAGNOSIS — M5442 Lumbago with sciatica, left side: Secondary | ICD-10-CM

## 2016-02-10 DIAGNOSIS — M5441 Lumbago with sciatica, right side: Secondary | ICD-10-CM | POA: Diagnosis not present

## 2016-02-10 DIAGNOSIS — M6281 Muscle weakness (generalized): Secondary | ICD-10-CM | POA: Insufficient documentation

## 2016-02-10 DIAGNOSIS — R2681 Unsteadiness on feet: Secondary | ICD-10-CM | POA: Insufficient documentation

## 2016-02-10 DIAGNOSIS — R293 Abnormal posture: Secondary | ICD-10-CM | POA: Insufficient documentation

## 2016-02-10 NOTE — Therapy (Signed)
[place], [place], [address] Phone: [phone]   Fax:  [phone]  Physical Therapy Treatment  Patient Details  Name: Alison [name] MRN: [id number] Date of Birth: 09-25-1931 Referring Provider: Phylliss [name]  Encounter Date: 02/10/2016      PT End of Session - 02/10/16 1545    Visit Number 5   Number of Visits 16   Date for PT Re-Evaluation 02/26/16   Authorization Type [employer] medicare   Authorization - Visit Number 5   Authorization - Number of Visits 10   PT Start Time [id number]   PT Stop Time 1555   PT Time Calculation (min) 38 min   Activity Tolerance Patient tolerated treatment well   Behavior During Therapy [facility] for tasks assessed/performed      Past Medical History  Diagnosis Date  . Hyperlipidemia   . Coronary atherosclerosis of native coronary artery     Occluded circ with collaterals and 90% RCA (previous stents at [facility], details not clear), LVEF 48% 1/11 - inferior and inferolateral scar by Myoview 1/11,   . Monoclonal gammopathy of undetermined significance   . Renal insufficiency   . 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 Osteoarthritis   . History of hematuria   . Chronic anemia     Past Surgical History  Procedure Laterality Date  . Right knee replacement  2011  . Left adrenalectomy  2009  . Right breast biopsy  2008    There were no vitals filed for this visit.      Subjective Assessment - 02/10/16 1523    Subjective Pt reports she has been feelign constant pain in her knees sicne last visit, very sore and grizzled, her lateral thighs feel like pulling.    Limitations Standing;Walking;House hold activities   How long can you sit comfortably? no problem    How long can you stand comfortably? less than five minues    How long can you walk comfortably? less than two minutes.    Patient Stated Goals decreased  pain and to be able to walk normal    Currently in Pain? Yes   Pain Score 6    Pain Location Knee   Pain Orientation Right;Left   Pain Type Chronic pain   Pain Onset More than a month ago   Aggravating Factors  this remains unclear.                          [place] Adult PT Treatment/Exercise - 02/10/16 0001    Lumbar Exercises: Stretches   Active Hamstring Stretch 3 reps;30 seconds   Active Hamstring Stretch Limitations seated, wih foot on 8" box, forward  lean   Single Knee to Chest Stretch 3 reps;30 seconds   Single Knee to Chest Stretch Limitations towel for grip   Lumbar Exercises: Supine   Ab Set 10 reps;5 seconds   Bent Knee Raise 10 reps  2x10    Bridge 10 reps  2x10   Bridge Limitations height limited by weakness   Other Supine Lumbar Exercises DKTC rocking x 20   ModA    Lumbar Exercises: Sidelying   Clam 20 reps  Blue TB    Manual Therapy   Manual therapy comments Supine Hip pull x4 min bilat  PT Short Term Goals - 01/27/16 1629    PT SHORT TERM GOAL #1   Title Patient core and bilateral hip strength to be improved 1/2 degree to allow patient to walk for ten minutes without pain increasing beyond 5/10    Time 4   Period Weeks   Status New   PT SHORT TERM GOAL #2   Title Pt to be able to stand in a fully erect position to allow decreased stress on low back musculature.    Time 4   Period Weeks   Status New   PT SHORT TERM GOAL #3   Title Pt to be able to verbalize the importance of posture and core strength in back care.    Time 4   Period Weeks   Status New   PT SHORT TERM GOAL #4   Title Pt to be able to stand for ten minutes with pain no greater than a 5/10 to be able to complete self grooming while standing.    Time 4   Period Weeks           PT Long Term Goals - 01/27/16 1633    PT LONG TERM GOAL #1   Title Pt core bilateral lower extremity strength to be increased one grade to allow patient to come  sit to stand without  increased pain   Time 6   Period Weeks   PT LONG TERM GOAL #2   Title Patient core and bilateral lower extremity strength to be increased a grade and a half to allow pt to go up and down four steps in a reciprocal manner.    Time 8   Period Weeks   Status New   PT LONG TERM GOAL #3   Title Pt to be able to ambulate for 15 minutes without her pain increasing above a 5/10 to allow pt to go to church service    Time 8   Period Weeks   Status New   PT LONG TERM GOAL #4   Title Pt to be able to single leg stance for five seconds on both Legs to reduce risk for falls    Time 8   Period Weeks   Status New               Plan - 02/10/16 1547    Clinical Impression Statement Pt maintains strong isolated strength deficits. Low back and scaitic pain easily aggravated with most exercises, hense attempted to keep pt in a decompressed state as much as possible during session, which helps only some. Core weakness remains very limitng in control of pelvic tilt. Added in isolated quads stregthening today which was tolerated well. Will continue with POC as previosuly outlined.    Rehab Potential Fair   PT Frequency 2x / week   PT Duration 8 weeks   PT Treatment/Interventions ADLs/Self Care Home Management;Therapeutic activities;Therapeutic exercise;Patient/family education;Manual techniques;Electrical Stimulation;Traction;Ultrasound   PT Next Visit Plan Contine core stabiltiy exercises, keep back in neutral.  Begin prone on elbows next session.   PT Home Exercise Plan no updates today.    Consulted and Agree with Plan of Care Patient      Patient will benefit from skilled therapeutic intervention in order to improve the following deficits and impairments:  Abnormal gait, Decreased activity tolerance, Decreased balance, Decreased range of motion, Decreased strength, Difficulty walking, Pain  Visit Diagnosis: Lumbago with sciatica, right side  Lumbago with sciatica, left  side  Unsteadiness on feet  Abnormal posture  Muscle weakness (generalized)     Problem List Patient Active Problem List   Diagnosis Date Noted  . Hypocalcemia 09/03/2013  . Leg edema 04/06/2013  . History of pulmonary embolus (PE) 01/05/2011  . HYPERLIPIDEMIA-MIXED 06/24/2009  . CAD, NATIVE VESSEL 06/24/2009   4:00 PM, 02/10/2016 Etta Grandchild, PT, DPT PRN Physical Therapist at [place] # [id number] [phone] (office)     [place], [place], [address] Phone: [phone]   Fax:  [phone]  Name: Alison [name] MRN: [id number] Date of Birth: 1930/12/25

## 2016-02-12 ENCOUNTER — Ambulatory Visit (HOSPITAL_COMMUNITY): Payer: Medicare Other

## 2016-02-12 DIAGNOSIS — R2681 Unsteadiness on feet: Secondary | ICD-10-CM

## 2016-02-12 DIAGNOSIS — M5441 Lumbago with sciatica, right side: Secondary | ICD-10-CM

## 2016-02-12 DIAGNOSIS — M6281 Muscle weakness (generalized): Secondary | ICD-10-CM

## 2016-02-12 DIAGNOSIS — R293 Abnormal posture: Secondary | ICD-10-CM

## 2016-02-12 DIAGNOSIS — M5442 Lumbago with sciatica, left side: Secondary | ICD-10-CM

## 2016-02-12 NOTE — Therapy (Signed)
[place], [place], [address] Phone: [phone]   Fax:  [phone]  Physical Therapy Treatment  Patient Details  Name: Alison [name] MRN: [id number] Date of Birth: 08-Dec-1930 Referring Provider: Phylliss Bob  Encounter Date: 02/12/2016      PT End of Session - 02/12/16 1527    Visit Number 6   Number of Visits 16   Date for PT Re-Evaluation 02/26/16   Authorization Type [employer] medicare   Authorization - Visit Number 6   Authorization - Number of Visits 10   PT Start Time [id number]   PT Stop Time 1602   PT Time Calculation (min) 41 min   Activity Tolerance Patient tolerated treatment well   Behavior During Therapy [facility] for tasks assessed/performed      Past Medical History  Diagnosis Date  . Hyperlipidemia   . Coronary atherosclerosis of native coronary artery     Occluded circ with collaterals and 90% RCA (previous stents at [facility], details not clear), LVEF 48% 1/11 - inferior and inferolateral scar by Myoview 1/11,   . Monoclonal gammopathy of undetermined significance   . Renal insufficiency   . 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 Osteoarthritis   . History of hematuria   . Chronic anemia     Past Surgical History  Procedure Laterality Date  . Right knee replacement  2011  . Left adrenalectomy  2009  . Right breast biopsy  2008    There were no vitals filed for this visit.      Subjective Assessment - 02/12/16 1524    Subjective Pt stated she continues to have constant pain Bil knees pain scale 7/10 no pain in back unless standing for any lenght of time.  Reports too sore to complete exercises at home.   Limitations Standing;Walking;House hold activities   Patient Stated Goals decreased pain and to be able to walk normal    Currently in Pain? Yes   Pain Score 7    Pain Location Knee   Pain Orientation Right;Left   Pain Descriptors / Indicators Sore                         [facility] Adult PT Treatment/Exercise - 02/12/16 0001    Lumbar Exercises: Stretches   Active Hamstring Stretch 3 reps;30 seconds   Active Hamstring Stretch Limitations supine with  towel 3x 30" as well as long seated 1x 30"   Single Knee to Chest Stretch 3 reps;30 seconds   Single Knee to Chest Stretch Limitations towel for grip   Prone on Elbows Stretch 1 rep  2 minutes   Lumbar Exercises: Standing   Other Standing Lumbar Exercises attempted standing, increased knee pain   Lumbar Exercises: Supine   Ab Set 10 reps;5 seconds   Bent Knee Raise 10 reps;5 seconds   Bridge 10 reps  2 sets   Bridge Limitations height limited by weakness   Lumbar Exercises: Sidelying   Hip Abduction 15 reps   Lumbar Exercises: Prone   Straight Leg Raise 5 reps  2x 5 reps with knees bent hip extension   Other Prone Lumbar Exercises heel squeeze 10x 5"                  PT Short Term Goals - 01/27/16 1629    PT SHORT TERM GOAL #1  Title Patient core and bilateral hip strength to be improved 1/2 degree to allow patient to walk for ten minutes without pain increasing beyond 5/10    Time 4   Period Weeks   Status New   PT SHORT TERM GOAL #2   Title Pt to be able to stand in a fully erect position to allow decreased stress on low back musculature.    Time 4   Period Weeks   Status New   PT SHORT TERM GOAL #3   Title Pt to be able to verbalize the importance of posture and core strength in back care.    Time 4   Period Weeks   Status New   PT SHORT TERM GOAL #4   Title Pt to be able to stand for ten minutes with pain no greater than a 5/10 to be able to complete self grooming while standing.    Time 4   Period Weeks           PT Long Term Goals - 01/27/16 1633    PT LONG TERM GOAL #1   Title Pt core bilateral lower extremity strength to be increased one grade to allow patient to come sit to stand without   increased pain   Time 6   Period Weeks   PT LONG TERM GOAL #2   Title Patient core and bilateral lower extremity strength to be increased a grade and a half to allow pt to go up and down four steps in a reciprocal manner.    Time 8   Period Weeks   Status New   PT LONG TERM GOAL #3   Title Pt to be able to ambulate for 15 minutes without her pain increasing above a 5/10 to allow pt to go to church service    Time 8   Period Weeks   Status New   PT LONG TERM GOAL #4   Title Pt to be able to single leg stance for five seconds on both Legs to reduce risk for falls    Time 8   Period Weeks   Status New               Plan - 02/12/16 1615    Clinical Impression Statement Pt continues to be limited by Bil knee pain, able to complete therex with no reports of increased pain through session.  Session focus on improving core and proximal musculature strengthening and improving spinal alignment to improve posture.  Core weakness remains very limiting in control of pelvic tilt with ab sets with multimodal cueing for proper activation.  Progressed hip strengthening with supine SLR and hip extension in prone modified for abilty due to weakness.  Added prone on elbows to improve spinal alignment to assist with posture, pt able to tolerate with no reports of increased pain.  Pt encouraged to begin at home and given HEP printout.  Attempted standing  posture, unable to complete this session due to knee pain.    Rehab Potential Fair   PT Frequency 2x / week   PT Duration 8 weeks   PT Treatment/Interventions ADLs/Self Care Home Management;Therapeutic activities;Therapeutic exercise;Patient/family education;Manual techniques;Electrical Stimulation;Traction;Ultrasound   PT Next Visit Plan Contine core stabiltiy exercises, keep back in neutral.    PT Home Exercise Plan prone on elbows      Patient will benefit from skilled therapeutic intervention in order to improve the following deficits and  impairments:  Abnormal gait, Decreased activity tolerance, Decreased balance, Decreased range  of motion, Decreased strength, Difficulty walking, Pain  Visit Diagnosis: Lumbago with sciatica, right side  Lumbago with sciatica, left side  Unsteadiness on feet  Abnormal posture  Muscle weakness (generalized)     Problem List Patient Active Problem List   Diagnosis Date Noted  . Hypocalcemia 09/03/2013  . Leg edema 04/06/2013  . History of pulmonary embolus (PE) 01/05/2011  . HYPERLIPIDEMIA-MIXED 06/24/2009  . CAD, NATIVE VESSEL 06/24/2009   Ihor Austin, [place]; [place]  [name] 02/12/2016, 4:28 PM  [place] [address] [place], [place], [address] Phone: [phone]   Fax:  [phone]  Name: Alison [name] MRN: [id number] Date of Birth: [birth year]

## 2016-02-12 NOTE — Patient Instructions (Signed)
On Elbows (Prone)    Rise up on elbows as high as possible, keeping hips on floor. Hold 2 minutes Repeat 2 times per set.  http://orth.exer.us/93   Copyright  VHI. All rights reserved.

## 2016-02-17 ENCOUNTER — Ambulatory Visit (HOSPITAL_COMMUNITY): Payer: Medicare Other

## 2016-02-17 DIAGNOSIS — M5441 Lumbago with sciatica, right side: Secondary | ICD-10-CM | POA: Diagnosis not present

## 2016-02-17 DIAGNOSIS — R2681 Unsteadiness on feet: Secondary | ICD-10-CM

## 2016-02-17 DIAGNOSIS — M6281 Muscle weakness (generalized): Secondary | ICD-10-CM

## 2016-02-17 DIAGNOSIS — M5442 Lumbago with sciatica, left side: Secondary | ICD-10-CM

## 2016-02-17 DIAGNOSIS — R293 Abnormal posture: Secondary | ICD-10-CM

## 2016-02-17 NOTE — Therapy (Signed)
[place], [place], [address] Phone: [phone]   Fax:  [phone]  Physical Therapy Treatment  Patient Details  Name: Alison [name] MRN: [id number] Date of Birth: [birth year] Referring Provider: Phylliss Bob  Encounter Date: 02/17/2016      PT End of Session - 02/17/16 1531    Visit Number 7   Number of Visits 16   Date for PT Re-Evaluation 02/26/16   Authorization Type UHC medicare   Authorization - Visit Number 7   Authorization - Number of Visits 10   PT Start Time 1520   PT Stop Time 1558   PT Time Calculation (min) 38 min   Activity Tolerance Patient tolerated treatment well   Behavior During Therapy [facility] for tasks assessed/performed      Past Medical History  Diagnosis Date  . Hyperlipidemia   . Coronary atherosclerosis of native coronary artery     Occluded circ with collaterals and 90% RCA (previous stents at [facility], details not clear), LVEF 48% 1/11 - inferior and inferolateral scar by Myoview 1/11,   . Monoclonal gammopathy of undetermined significance   . Renal insufficiency   . 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 Osteoarthritis   . History of hematuria   . Chronic anemia     Past Surgical History  Procedure Laterality Date  . Right knee replacement  2011  . Left adrenalectomy  2009  . Right breast biopsy  2008    There were no vitals filed for this visit.      Subjective Assessment - 02/17/16 1521    Subjective Pt c/o Bil knee pain scale 8/10, has been doing some standing and exercises at home.  Reports knees feel like being stabbed today.    Patient Stated Goals decreased pain and to be able to walk normal               [facility] Adult PT Treatment/Exercise - 02/17/16 0001  Supine Stretches: Hamstrings 3x 30" with rope for gastroc stretch as well SKTC 3x 30"    Lumbar Exercises: Standing   Forward Lunge 5 reps   Forward Lunge Limitations 6in step   Other Standing Lumbar Exercises standing with cueing for posture   Lumbar Exercises: Seated   Hip Flexion on Ball Limitations   Hip Flexion on Ball Limitations rows 2x 10reps with RTB seated on mat   Lumbar Exercises: Supine   Bent Knee Raise 10 reps;5 seconds   Bridge 10 reps   Bridge Limitations height limited by weakness   Lumbar Exercises: Prone   Straight Leg Raise 5 reps x 2 sets  AAROM with knees bent hip extension   Other Prone Lumbar Exercises heel squeeze 10x 5"                  PT Short Term Goals - 01/27/16 1629    PT SHORT TERM GOAL #1   Title Patient core and bilateral hip strength to be improved 1/2 degree to allow patient to walk for ten minutes without pain increasing beyond 5/10    Time 4   Period Weeks   Status New   PT SHORT TERM GOAL #2   Title Pt to be able to stand in a fully erect position to allow decreased stress on low back musculature.    Time 4   Period Weeks   Status New  PT SHORT TERM GOAL #3   Title Pt to be able to verbalize the importance of posture and core strength in back care.    Time 4   Period Weeks   Status New   PT SHORT TERM GOAL #4   Title Pt to be able to stand for ten minutes with pain no greater than a 5/10 to be able to complete self grooming while standing.    Time 4   Period Weeks           PT Long Term Goals - 01/27/16 1633    PT LONG TERM GOAL #1   Title Pt core bilateral lower extremity strength to be increased one grade to allow patient to come sit to stand without  increased pain   Time 6   Period Weeks   PT LONG TERM GOAL #2   Title Patient core and bilateral lower extremity strength to be increased a grade and a half to allow pt to go up and down four steps in a reciprocal manner.    Time 8   Period Weeks   Status New   PT LONG TERM GOAL #3   Title Pt to be able to ambulate for 15 minutes without her pain increasing above a 5/10 to allow  pt to go to church service    Time 8   Period Weeks   Status New   PT LONG TERM GOAL #4   Title Pt to be able to single leg stance for five seconds on both Legs to reduce risk for falls    Time 8   Period Weeks   Status New               Plan - 02/17/16 1850    Clinical Impression Statement Continued session focus on improving core stabiltiy.  Pt continues to be limited by Bil knee pain increased wtih weight bearing.  Added postural strengthening exercises and explained importance of weight bearing for functional strengthening.  Began forward lunges on 6 in step with UE A, moderate cueing and explaination of importance of posture to reduce stress on lower back.  End of sessoin pt continues to c/o Bil knee pain, no reports of increased pain.     Rehab Potential Fair   PT Frequency 2x / week   PT Duration 8 weeks   PT Treatment/Interventions ADLs/Self Care Home Management;Therapeutic activities;Therapeutic exercise;Patient/family education;Manual techniques;Electrical Stimulation;Traction;Ultrasound   PT Next Visit Plan Continue with current PT POC.  Next session trial with increased weight bearing activities, educated pt on benefits for functional strengthening for progressing towards goals.  Continue core stability exercises, keeping back in neutral.        Patient will benefit from skilled therapeutic intervention in order to improve the following deficits and impairments:  Abnormal gait, Decreased activity tolerance, Decreased balance, Decreased range of motion, Decreased strength, Difficulty walking, Pain  Visit Diagnosis: Lumbago with sciatica, right side  Lumbago with sciatica, left side  Unsteadiness on feet  Abnormal posture  Muscle weakness (generalized)     Problem List Patient Active Problem List   Diagnosis Date Noted  . Hypocalcemia 09/03/2013  . Leg edema 04/06/2013  . History of pulmonary embolus (PE) 01/05/2011  . HYPERLIPIDEMIA-MIXED 06/24/2009  .  CAD, NATIVE VESSEL 06/24/2009   Ihor Austin, [place]; [place]  [name] 02/17/2016, 6:54 PM  [place] [address] [place], [place], [address] Phone: [phone]   Fax:  [phone]  Name: Alison [name] MRN: [id number] Date of Birth: 1931/02/10

## 2016-02-19 ENCOUNTER — Ambulatory Visit (HOSPITAL_COMMUNITY): Payer: Medicare Other

## 2016-02-19 DIAGNOSIS — M6281 Muscle weakness (generalized): Secondary | ICD-10-CM

## 2016-02-19 DIAGNOSIS — M5442 Lumbago with sciatica, left side: Secondary | ICD-10-CM

## 2016-02-19 DIAGNOSIS — R2681 Unsteadiness on feet: Secondary | ICD-10-CM | POA: Diagnosis not present

## 2016-02-19 DIAGNOSIS — M5441 Lumbago with sciatica, right side: Secondary | ICD-10-CM | POA: Diagnosis not present

## 2016-02-19 DIAGNOSIS — R293 Abnormal posture: Secondary | ICD-10-CM

## 2016-02-19 NOTE — Therapy (Addendum)
[facility] [address] [place], [place], [address] Phone: [phone]   Fax:  [phone]  Physical Therapy Treatment  Patient Details  Name: Alison [name] MRN: [id number] Date of Birth: [birth year] Referring Provider: Estill Bamberg  Encounter Date: 02/19/2016      PT End of Session - 02/19/16 1601    Visit Number 8   Number of Visits 16   Date for PT Re-Evaluation 02/26/16   Authorization Type [employer] medicare   Authorization - Visit Number 8   Authorization - Number of Visits 10   PT Start Time 1518   PT Stop Time 1600   PT Time Calculation (min) 42 min   Activity Tolerance Patient tolerated treatment well;Patient limited by pain   Behavior During Therapy [facility] for tasks assessed/performed      Past Medical History  Diagnosis Date  . Hyperlipidemia   . Coronary atherosclerosis of native coronary artery     Occluded circ with collaterals and 90% RCA (previous stents at [facility], details not clear), LVEF 48% 1/11 - inferior and inferolateral scar by Myoview 1/11,   . Monoclonal gammopathy of undetermined significance   . Renal insufficiency   . 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 Osteoarthritis   . History of hematuria   . Chronic anemia     Past Surgical History  Procedure Laterality Date  . Right knee replacement  2011  . Left adrenalectomy  2009  . Right breast biopsy  2008    There were no vitals filed for this visit.      Subjective Assessment - 02/19/16 1505    Subjective Pt stated she's the same when asked about pain, continues to be limited by severe Bil knee pain today.  Pain scale 8/10 Bil LE.   Patient Stated Goals decreased pain and to be able to walk normal    Currently in Pain? Yes   Pain Score 8    Pain Location Knee   Pain Orientation Left;Right   Pain Descriptors / Indicators --  "knives sticking in"   Pain Type Chronic  pain              [facility] Adult PT Treatment/Exercise - 02/19/16 0001    Lumbar Exercises: Stretches   Active Hamstring Stretch 3 reps;30 seconds   Active Hamstring Stretch Limitations supine with  towel 3x 30" as well as long seated 1x 30"   Passive Hamstring Stretch 2 reps;30 seconds   Standing Extension 5 reps   Lumbar Exercises: Standing   Forward Lunge 10 reps   Forward Lunge Limitations 6in step   Other Standing Lumbar Exercises standing for 2 minutes with cueing for posture   Lumbar Exercises: Seated   Hip Flexion on Ball Limitations   Hip Flexion on Ball Limitations rows 2x 10reps with RTB   Sit to Stand 10 reps   Sit to Stand Limitations no UE A   Lumbar Exercises: Supine   Bent Knee Raise 15 reps;5 seconds   Bridge 10 reps  2 sets   Lumbar Exercises: Prone   Straight Leg Raise 10 reps  2 sets x 5   Other Prone Lumbar Exercises heel squeeze 10x 5"              PT Short Term Goals - 01/27/16 1629    PT SHORT TERM GOAL #1   Title Patient core and bilateral hip  strength to be improved 1/2 degree to allow patient to walk for ten minutes without pain increasing beyond 5/10    Time 4   Period Weeks   Status New   PT SHORT TERM GOAL #2   Title Pt to be able to stand in a fully erect position to allow decreased stress on low back musculature.    Time 4   Period Weeks   Status New   PT SHORT TERM GOAL #3   Title Pt to be able to verbalize the importance of posture and core strength in back care.    Time 4   Period Weeks   Status New   PT SHORT TERM GOAL #4   Title Pt to be able to stand for ten minutes with pain no greater than a 5/10 to be able to complete self grooming while standing.    Time 4   Period Weeks           PT Long Term Goals - 01/27/16 1633    PT LONG TERM GOAL #1   Title Pt core bilateral lower extremity strength to be increased one grade to allow patient to come sit to stand without  increased pain   Time 6   Period Weeks   PT  LONG TERM GOAL #2   Title Patient core and bilateral lower extremity strength to be increased a grade and a half to allow pt to go up and down four steps in a reciprocal manner.    Time 8   Period Weeks   Status New   PT LONG TERM GOAL #3   Title Pt to be able to ambulate for 15 minutes without her pain increasing above a 5/10 to allow pt to go to church service    Time 8   Period Weeks   Status New   PT LONG TERM GOAL #4   Title Pt to be able to single leg stance for five seconds on both Legs to reduce risk for falls    Time 8   Period Weeks   Status New               Plan - 02/19/16 1716    Clinical Impression Statement Pt continues to be limited by Bil knee pain.  Pt educated on benefits of functional strengthening with weight bearing, pt verbalized understanding and agreed to complete standing exercises.  Continued education on importance of posture to reduce stress on lower back musculature. Added sit to stands and standing extension to improve posture and functional strengthening without UE A.  Pt presented wiht improved posture upon standing.  End of session pt limited by fatigue, reports decreased LE pain at end of session to 5/10.   Rehab Potential Fair   PT Frequency 2x / week   PT Duration 8 weeks   PT Treatment/Interventions ADLs/Self Care Home Management;Therapeutic activities;Therapeutic exercise;Patient/family education;Manual techniques;Electrical Stimulation;Traction;Ultrasound   PT Next Visit Plan Continue with current PT POC, increase weight bearing activities as able.        Patient will benefit from skilled therapeutic intervention in order to improve the following deficits and impairments:  Abnormal gait, Decreased activity tolerance, Decreased balance, Decreased range of motion, Decreased strength, Difficulty walking, Pain  Visit Diagnosis: Lumbago with sciatica, right side  Lumbago with sciatica, left side  Unsteadiness on feet  Abnormal  posture  Muscle weakness (generalized)  G8982 CK Y7062 CK   Problem List Patient Active Problem List   Diagnosis Date Noted  .  Hypocalcemia 09/03/2013  . Leg edema 04/06/2013  . History of pulmonary embolus (PE) 01/05/2011  . HYPERLIPIDEMIA-MIXED 06/24/2009  . CAD, NATIVE VESSEL 06/24/2009   Ihor Austin, [place]; [place]  [name] 02/19/2016, 5:28 PM  [place], [place], [address] Phone: [phone]   Fax:  [phone]  Name: PARTICIA STRAHM MRN: [id number] Date of Birth: [birth year]    PHYSICAL THERAPY DISCHARGE SUMMARY  Visits from Start of Care: 8  Current functional level related to goals / functional outcomes: As above    Remaining deficits: As above    Education / Equipment: HEP Plan: Patient agrees to discharge.  Patient goals were not met. Patient is being discharged due to not returning since the last visit.  ?????       Rayetta Humphrey, [place] CLT [phone]

## 2016-02-23 ENCOUNTER — Telehealth (HOSPITAL_COMMUNITY): Payer: Self-pay

## 2016-02-23 NOTE — Telephone Encounter (Signed)
02/23/16 message left to cx this weeks appts -- she is in so much pain that she can't make it in

## 2016-02-24 ENCOUNTER — Ambulatory Visit (HOSPITAL_COMMUNITY): Payer: Medicare Other | Admitting: Physical Therapy

## 2016-02-26 ENCOUNTER — Encounter (HOSPITAL_COMMUNITY): Payer: Medicare Other

## 2016-03-01 ENCOUNTER — Telehealth (HOSPITAL_COMMUNITY): Payer: Self-pay

## 2016-03-01 NOTE — Telephone Encounter (Signed)
03/01/16 called to let know about a rescheduled appt and she said that she needed to cx last appts because she is going to the dr to find out where her pains are coming from

## 2016-03-02 ENCOUNTER — Encounter (HOSPITAL_COMMUNITY): Payer: Medicare Other

## 2016-03-03 DIAGNOSIS — M1711 Unilateral primary osteoarthritis, right knee: Secondary | ICD-10-CM | POA: Diagnosis not present

## 2016-03-03 DIAGNOSIS — M4806 Spinal stenosis, lumbar region: Secondary | ICD-10-CM | POA: Diagnosis not present

## 2016-03-03 DIAGNOSIS — M545 Low back pain: Secondary | ICD-10-CM | POA: Diagnosis not present

## 2016-03-03 DIAGNOSIS — M1712 Unilateral primary osteoarthritis, left knee: Secondary | ICD-10-CM | POA: Diagnosis not present

## 2016-03-04 ENCOUNTER — Encounter (HOSPITAL_COMMUNITY): Payer: Medicare Other | Admitting: Physical Therapy

## 2016-03-17 DIAGNOSIS — M4806 Spinal stenosis, lumbar region: Secondary | ICD-10-CM | POA: Diagnosis not present

## 2016-03-22 ENCOUNTER — Other Ambulatory Visit: Payer: Self-pay | Admitting: Adult Health

## 2016-04-23 DIAGNOSIS — M4806 Spinal stenosis, lumbar region: Secondary | ICD-10-CM | POA: Diagnosis not present

## 2016-05-04 DIAGNOSIS — M4806 Spinal stenosis, lumbar region: Secondary | ICD-10-CM | POA: Diagnosis not present

## 2016-05-04 DIAGNOSIS — M4804 Spinal stenosis, thoracic region: Secondary | ICD-10-CM | POA: Diagnosis not present

## 2016-05-04 DIAGNOSIS — M4316 Spondylolisthesis, lumbar region: Secondary | ICD-10-CM | POA: Diagnosis not present

## 2016-05-05 ENCOUNTER — Other Ambulatory Visit: Payer: Self-pay | Admitting: Neurological Surgery

## 2016-05-05 DIAGNOSIS — M4804 Spinal stenosis, thoracic region: Secondary | ICD-10-CM

## 2016-05-16 ENCOUNTER — Ambulatory Visit
Admission: RE | Admit: 2016-05-16 | Discharge: 2016-05-16 | Disposition: A | Discharge status: Home / Self Care | Payer: Medicare Other | Source: Ambulatory Visit | Attending: Neurological Surgery | Admitting: Neurological Surgery

## 2016-05-16 DIAGNOSIS — M4804 Spinal stenosis, thoracic region: Secondary | ICD-10-CM | POA: Diagnosis not present

## 2016-06-21 DIAGNOSIS — M4316 Spondylolisthesis, lumbar region: Secondary | ICD-10-CM | POA: Diagnosis not present

## 2016-06-22 ENCOUNTER — Other Ambulatory Visit: Payer: Self-pay | Admitting: Neurological Surgery

## 2016-07-05 ENCOUNTER — Encounter: Payer: Self-pay | Admitting: *Deleted

## 2016-07-06 ENCOUNTER — Encounter: Payer: Self-pay | Admitting: Cardiology

## 2016-07-06 ENCOUNTER — Ambulatory Visit (INDEPENDENT_AMBULATORY_CARE_PROVIDER_SITE_OTHER): Payer: Medicare Other | Admitting: Cardiology

## 2016-07-06 ENCOUNTER — Encounter: Payer: Self-pay | Admitting: *Deleted

## 2016-07-06 VITALS — BP 119/75 | HR 59 | Ht 61.0 in | Wt 214.8 lb

## 2016-07-06 DIAGNOSIS — E782 Mixed hyperlipidemia: Secondary | ICD-10-CM | POA: Diagnosis not present

## 2016-07-06 DIAGNOSIS — Z86711 Personal history of pulmonary embolism: Secondary | ICD-10-CM | POA: Diagnosis not present

## 2016-07-06 DIAGNOSIS — I2510 Atherosclerotic heart disease of native coronary artery without angina pectoris: Secondary | ICD-10-CM

## 2016-07-06 DIAGNOSIS — Z01810 Encounter for preprocedural cardiovascular examination: Secondary | ICD-10-CM | POA: Diagnosis not present

## 2016-07-06 NOTE — Progress Notes (Signed)
Cardiology Office Note  Date: 07/06/2016   ID: Alison [name], DOB [birth year], MRN [id number]  PCP: Alison Font, MD  Primary Cardiologist: Alison Lesches, MD   Chief Complaint  Patient presents with  . Coronary Artery Disease  . Preoperative evaluation    History of Present Illness: Alison [name] is an 82 y.o. female last seen in September 2016. She is here today for a preoperative evaluation. She has symptomatic spinal stenosis and is being considered for lumbar fusion and laminectomy by Dr. Sherley [name]. Surgery is tentatively scheduled for next week. She has a preanesthesia evaluation on Thursday.  She is very limited in terms of her physical activity due to leg pain, in a wheelchair today. She does not report any obvious angina symptoms on current medical regimen. History includes CAD in the circumflex and RCA distribution with prior percutaneous intervention at [name]. Last ischemic evaluation was in 2011 which revealed inferior and inferolateral scar.  I reviewed her ECG today which shows sinus bradycardia with poor R-wave progression and nonspecific T-wave changes, borderline low voltage.  Cardiac regimen includes Plavix, Cozaar, Toprol-XL, Crestor, and as needed nitroglycerin.  Past Medical History:  Diagnosis Date  . Chronic anemia   . Coronary atherosclerosis of native coronary artery    Occluded circ with collaterals and 90% RCA (previous stents at [facility], details not clear), LVEF 48% 1/11 - inferior and inferolateral scar by Myoview 1/11,   . History of hematuria   . Hyperlipidemia   . Monoclonal gammopathy of undetermined significance   . Osteoarthritis   . Pulmonary embolism (HCC)    History of recurrent DVT and PE status post IVC filter, previously on Coumadin  . Renal insufficiency     Past Surgical History:  Procedure Laterality Date  . Left adrenalectomy  2009  . Right breast biopsy  2008  . Right knee replacement  2011    Current  Outpatient Prescriptions  Medication Sig Dispense Refill  . acetaminophen (TYLENOL) 650 MG CR tablet Take 1,300 mg by mouth daily.    Marland Kitchen allopurinol (ZYLOPRIM) 100 MG tablet Take 100 mg by mouth daily.    . Cholecalciferol (VITAMIN D3) 1000 units CAPS Take 1 capsule by mouth daily.    . clopidogrel (PLAVIX) 75 MG tablet TAKE 1 TABLET BY MOUTH EVERY DAY 90 tablet 2  . CRESTOR 10 MG tablet TAKE 1 TABLET EVERY DAY 90 tablet 2  . ferrous sulfate 325 (65 FE) MG tablet Take 325 mg by mouth daily.     . furosemide (LASIX) 40 MG tablet TAKE 1 TABLET EVERY DAY 90 tablet 2  . losartan (COZAAR) 25 MG tablet TAKE 1 TABLET BY MOUTH EVERY DAY 90 tablet 3  . metoprolol succinate (TOPROL-XL) 50 MG 24 hr tablet TAKE 1 TABLET EVERY DAY WITH OR IMMEDIATELY FOLLOWING A MEAL 90 tablet 2  . nitroGLYCERIN (NITROSTAT) 0.4 MG SL tablet Place 0.4 mg under the tongue every 5 (five) minutes as needed for chest pain.     Marland Kitchen ADVAIR DISKUS 250-50 MCG/DOSE AEPB Inhale 1 Inhaler into the lungs daily as needed (wheezing).      No current facility-administered medications for this visit.    Allergies:  Review of patient's allergies indicates no known allergies.   Social History: The patient  reports that she quit smoking about 27 years ago. Her smoking use included Cigarettes. She has a 2.40 pack-year smoking history. She has never used smokeless tobacco. She reports that she does not drink alcohol  or use drugs.   ROS:  Please see the history of present illness. Otherwise, complete review of systems is positive for bilateral leg pain with standing and ambulation.  All other systems are reviewed and negative.   Physical Exam: VS:  BP 119/75   Pulse (!) 59   Ht 5\' 1"  (1.549 m)   Wt 214 lb 12.8 oz (97.4 kg)   SpO2 98%   BMI 40.59 kg/m , BMI Body mass index is 40.59 kg/m.  Wt Readings from Last 3 Encounters:  07/06/16 214 lb 12.8 oz (97.4 kg)  08/22/15 235 lb (106.6 kg)  07/04/15 209 lb (94.8 kg)    Obese woman, appears  comfortable at rest. Seated in wheelchair. HEENT: Conjunctiva and lids normal, oropharynx clear.  Neck: Supple, no elevated JVP or carotid bruits, no thyromegaly.  Lungs: Clear to auscultation, nonlabored breathing at rest.  Cardiac: Regular rate and rhythm, no S3, soft systolic murmur, no pericardial rub.  Abdomen: Soft, nontender, bowel sounds present, no guarding or rebound.  Extremities: 1+ edema, distal pulses 2+.  Skin: Warm and dry. Musculoskeletal: No kyphosis. Neuropsychiatric: Alert and oriented 3, affect appropriate.  ECG: I personally reviewed the tracing from 07/04/2015 which showed sinus rhythm with nonspecific T-wave changes.  Assessment and Plan:  1. Preoperative evaluation in an 82 year old woman with known CAD as outlined above, hyperlipidemia, and symptomatic spinal stenosis being considered for lumbar fusion and laminectomy by Dr. Sherley [name] next week. ECG reviewed today. We will arrange a Lexiscan Myoview for assessment of ischemic burden. Further recommendations from there.  2. Hyperlipidemia, continues on statin therapy. She follows with Alison [name].  3. Previous history of DVT and pulmonary embolus, IVC filter in place. She is not anticoagulated. This is followed by Alison [name].   Current medicines were reviewed with the patient today.   Orders Placed This Encounter  Procedures  . NM Myocar Multi W/Spect W/Wall Motion / EF  . Myocardial Perfusion Imaging  . EKG 12-Lead    Disposition: Call with results, otherwise keep routine follow-up.  Signed, Alison Sark, MD, [facility] 07/06/2016 3:30 PM    [facility] [place], [place], [place] [address] Phone: [phone]; Fax: [phone]

## 2016-07-06 NOTE — Patient Instructions (Signed)
Your physician wants you to follow-up in: [place] will receive a reminder letter in the mail two months in advance. If you don't receive a letter, please call our office to schedule the follow-up appointment.  Your physician recommends that you continue on your current medications as directed. Please refer to the Current Medication list given to you today.  Your physician has requested that you have a lexiscan myoview. For further information please visit [email or web address]. Please follow instruction sheet, as given.  Thank you for choosing [facility]!!

## 2016-07-07 ENCOUNTER — Encounter (HOSPITAL_COMMUNITY): Payer: Self-pay

## 2016-07-07 ENCOUNTER — Encounter (HOSPITAL_COMMUNITY)
Admission: RE | Admit: 2016-07-07 | Discharge: 2016-07-07 | Disposition: A | Discharge status: Home / Self Care | Payer: Medicare Other | Source: Ambulatory Visit | Attending: Cardiology | Admitting: Cardiology

## 2016-07-07 ENCOUNTER — Inpatient Hospital Stay (HOSPITAL_COMMUNITY): Admission: RE | Admit: 2016-07-07 | Payer: Medicare Other | Source: Ambulatory Visit

## 2016-07-07 ENCOUNTER — Telehealth: Payer: Self-pay | Admitting: *Deleted

## 2016-07-07 DIAGNOSIS — I2510 Atherosclerotic heart disease of native coronary artery without angina pectoris: Secondary | ICD-10-CM | POA: Diagnosis not present

## 2016-07-07 HISTORY — DX: Essential (primary) hypertension: I10

## 2016-07-07 LAB — NM MYOCAR MULTI W/SPECT W/WALL MOTION / EF
CHL CUP NUCLEAR SRS: 8
CHL CUP NUCLEAR SSS: 8
LV dias vol: 71 mL (ref 46–106)
LV sys vol: 29 mL
Peak HR: 88 {beats}/min
RATE: 0.35
Rest HR: 64 {beats}/min
SDS: 0
TID: 0.89

## 2016-07-07 MED ORDER — REGADENOSON 0.4 MG/5ML IV SOLN
INTRAVENOUS | Status: AC
  Administered 2016-07-07: 0.4 mg via INTRAVENOUS
  Filled 2016-07-07: qty 5

## 2016-07-07 MED ORDER — TECHNETIUM TC 99M TETROFOSMIN IV KIT
10.0000 | PACK | Freq: Once | INTRAVENOUS | Status: AC | PRN
  Administered 2016-07-07: 10.2 via INTRAVENOUS

## 2016-07-07 MED ORDER — SODIUM CHLORIDE 0.9% FLUSH
INTRAVENOUS | Status: AC
  Administered 2016-07-07: 10 mL via INTRAVENOUS
  Filled 2016-07-07: qty 10

## 2016-07-07 MED ORDER — TECHNETIUM TC 99M TETROFOSMIN IV KIT
30.0000 | PACK | Freq: Once | INTRAVENOUS | Status: AC | PRN
  Administered 2016-07-07: 33 via INTRAVENOUS

## 2016-07-07 NOTE — Telephone Encounter (Signed)
-----   Message from Satira Sark, MD sent at 07/07/2016  3:11 PM EDT ----- Results reviewed. I reviewed the images myself. Study is consistent with component of inferior wall scar as well as attenuation artifact, but importantly no substantial degree of ischemia in this distribution. I would have graded the test as low risk overall, particularly with normal LVEF 59%. She should be able to proceed with planned surgery at an acceptable perioperative cardiac risk. A copy of this test should be forwarded to Maggie Font, MD.

## 2016-07-07 NOTE — Telephone Encounter (Signed)
Patient informed and copy sent to PCP and surgeon. 

## 2016-07-08 ENCOUNTER — Encounter (HOSPITAL_COMMUNITY): Payer: Self-pay

## 2016-07-08 ENCOUNTER — Encounter (HOSPITAL_COMMUNITY)
Admission: RE | Admit: 2016-07-08 | Discharge: 2016-07-08 | Disposition: A | Discharge status: Home / Self Care | Payer: Medicare Other | Source: Ambulatory Visit | Attending: Neurological Surgery | Admitting: Neurological Surgery

## 2016-07-08 DIAGNOSIS — Z01818 Encounter for other preprocedural examination: Secondary | ICD-10-CM | POA: Diagnosis not present

## 2016-07-08 DIAGNOSIS — E119 Type 2 diabetes mellitus without complications: Secondary | ICD-10-CM | POA: Insufficient documentation

## 2016-07-08 HISTORY — DX: Chronic obstructive pulmonary disease, unspecified: J44.9

## 2016-07-08 HISTORY — DX: Other chronic pain: G89.29

## 2016-07-08 HISTORY — DX: Personal history of other medical treatment: Z92.89

## 2016-07-08 HISTORY — DX: Nocturia: R35.1

## 2016-07-08 HISTORY — DX: Gout, unspecified: M10.9

## 2016-07-08 HISTORY — DX: Acute myocardial infarction, unspecified: I21.9

## 2016-07-08 HISTORY — DX: Dorsalgia, unspecified: M54.9

## 2016-07-08 LAB — BASIC METABOLIC PANEL
ANION GAP: 8 (ref 5–15)
BUN: 37 mg/dL — AB (ref 6–20)
CHLORIDE: 109 mmol/L (ref 101–111)
CO2: 24 mmol/L (ref 22–32)
Calcium: 9.5 mg/dL (ref 8.9–10.3)
Creatinine, Ser: 1.43 mg/dL — ABNORMAL HIGH (ref 0.44–1.00)
GFR calc Af Amer: 38 mL/min — ABNORMAL LOW (ref >60)
GFR calc non Af Amer: 33 mL/min — ABNORMAL LOW (ref >60)
Glucose, Bld: 92 mg/dL (ref 65–99)
POTASSIUM: 4.4 mmol/L (ref 3.5–5.1)
SODIUM: 141 mmol/L (ref 135–145)

## 2016-07-08 LAB — TYPE AND SCREEN
ABO/RH(D): O POS
Antibody Screen: NEGATIVE

## 2016-07-08 LAB — CBC
HEMATOCRIT: 31.2 % — AB (ref 36.0–46.0)
HEMOGLOBIN: 9.3 g/dL — AB (ref 12.0–15.0)
MCH: 28.6 pg (ref 26.0–34.0)
MCHC: 29.8 g/dL — ABNORMAL LOW (ref 30.0–36.0)
MCV: 96 fL (ref 78.0–100.0)
Platelets: 207 10*3/uL (ref 150–400)
RBC: 3.25 MIL/uL — ABNORMAL LOW (ref 3.87–5.11)
RDW: 16.2 % — AB (ref 11.5–15.5)
WBC: 12 10*3/uL — AB (ref 4.0–10.5)

## 2016-07-08 LAB — SURGICAL PCR SCREEN
MRSA, PCR: NEGATIVE
STAPHYLOCOCCUS AUREUS: POSITIVE — AB

## 2016-07-08 MED ORDER — CHLORHEXIDINE GLUCONATE CLOTH 2 % EX PADS: 6.0000 | MEDICATED_PAD | Freq: Once | CUTANEOUS | Status: DC

## 2016-07-08 NOTE — Progress Notes (Signed)
[facility]cin called into the [facility] in [place]. Message left for pt that this was called in.

## 2016-07-08 NOTE — Progress Notes (Signed)
   07/08/16 1037  OBSTRUCTIVE SLEEP APNEA  Have you ever been diagnosed with sleep apnea through a sleep study? No  Do you snore loudly (loud enough to be heard through closed doors)?  1  Do you often feel tired, fatigued, or sleepy during the daytime (such as falling asleep during driving or talking to someone)? 0  Has anyone observed you stop breathing during your sleep? 0  Do you have, or are you being treated for high blood pressure? 1  BMI more than 35 kg/m2? 1  Age > 50 (1-yes) 1  Neck circumference greater than:Female 16 inches or larger, Female 17inches or larger? 1 (21)  Female Gender (Yes=1) 0  Obstructive Sleep Apnea Score 5  Score 5 or greater  Results sent to PCP

## 2016-07-08 NOTE — Progress Notes (Signed)
Cardiologist is Dr.McDowell=last visit in epic from 07-06-16  Medical MD is Dr.Gerald Hill  Echo denies  Stress test report in epic from 07-06-16  Heart cath in 1997  EKG in epic from 07-06-16  CXR denies  Pt and daughter state that Dr.McDowell told her to hold her Plavix 5 days prior to surgery.

## 2016-07-08 NOTE — Pre-Procedure Instructions (Signed)
Stacy Sailer Czaplicki  07/08/2016      [id number] [place] [place] [address] [place] [address] Phone: [phone] Fax: [phone]    Your procedure is scheduled on Thurs, Oct 5 @ 11:00 AM  Report to [facility] Admitting at 9:00 AM  Call this number if you have problems the morning of surgery:  [phone]   Remember:  Do not eat food or drink liquids after midnight.  Take these medicines the morning of surgery with A SIP OF WATER Advair<Bring Your Inhaler With You>,Allopurinol(Zyloprim), and Metoprolol(Toprol)             Stop taking any Vitamins or Herbal Medications. No Goody's,BC's,Aleve,Advil,Motrin,Ibuprofen,or Fish Oil.     Do not wear jewelry, make-up or nail polish.  Do not wear lotions, powders, perfumes, or deoderant.  Do not shave 48 hours prior to surgery.    Do not bring valuables to the hospital.  [facility] is not responsible for any belongings or valuables.  Contacts, dentures or bridgework may not be worn into surgery.  Leave your suitcase in the car.  After surgery it may be brought to your room.  For patients admitted to the hospital, discharge time will be determined by your treatment team.  Patients discharged the day of surgery will not be allowed to drive home.    [facility] Preparing for Surgery  Before surgery, you can play an important role.  Because skin is not sterile, your skin needs to be as free of germs as possible.  You can reduce the number of germs on you skin by washing with CHG (chlorahexidine gluconate) soap before surgery.  CHG is an antiseptic cleaner which kills germs and bonds with the skin to continue killing germs even after washing.  Please DO NOT use if you have an allergy to CHG or antibacterial soaps.  If your skin becomes reddened/irritated stop using the CHG and inform your nurse when you arrive at Short Stay.  Do not shave (including  legs and underarms) for at least 48 hours prior to the first CHG shower.  You may shave your face.  Please follow these instructions carefully:   1.  Shower with CHG Soap the night before surgery and the                                morning of Surgery.  2.  If you choose to wash your hair, wash your hair first as usual with your       normal shampoo.  3.  After you shampoo, rinse your hair and body thoroughly to remove the                      Shampoo.  4.  Use CHG as you would any other liquid soap.  You can apply chg directly       to the skin and wash gently with scrungie or a clean washcloth.  5.  Apply the CHG Soap to your body ONLY FROM THE NECK DOWN.        Do not use on open wounds or open sores.  Avoid contact with your eyes,       ears, mouth and genitals (private parts).  Wash genitals (private parts)       with your normal soap.  6.  Wash thoroughly, paying special attention to the area where your surgery        will be performed.  7.  Thoroughly rinse your body with warm water from the neck down.  8.  DO NOT shower/wash with your normal soap after using and rinsing off       the CHG Soap.  9.  Pat yourself dry with a clean towel.            10.  Wear clean pajamas.            11.  Place clean sheets on your bed the night of your first shower and do not        sleep with pets.  Day of Surgery  Do not apply any lotions/deoderants the morning of surgery.  Please wear clean clothes to the hospital/surgery center.    Please read over the following fact sheets that you were given. Pain Booklet, MRSA Information and Surgical Site Infection Prevention

## 2016-07-09 ENCOUNTER — Encounter (HOSPITAL_COMMUNITY): Payer: Self-pay | Admitting: Emergency Medicine

## 2016-07-09 NOTE — Progress Notes (Addendum)
Anesthesia Chart Review:  Pt is an 82 year old female scheduled for L4-5 posterior lateral fusion with laminectomy on 07/15/2016 with Sherley Bounds, MD.   - Cardiologist is Rozann Lesches, MD, last office visit 07/06/16.  Dr. Domenic Polite cleared pt for surgery in comment on stress test result  - PCP is [name], MD.   PMH includes:  CAD (by notes "stents" in 1997), MI, HTN, hyperlipidemia, COPD, PE, anemia. Never smoker. BMI 40  Medications include: advair, plavix, crestor, iron, lasix, losartan, metoprolol. Pt to hold plavix 5 days before surgery  Preoperative labs reviewed.   - H/H 9.3/31.2. I notified Lorriane Shire in Dr. Ronnald Ramp' office.  - Pt used to see Zola Button, MD with hem-onc back in 2011 for anemia. Notes indicate baseline hgb was 10-11.   EKG 07/06/16: Sinus  Bradycardia (55 bpm). Decreased R wave progression. Diffuse nonspecific T-abnormality.   Nuclear stress test 07/07/16:   There was no ST segment deviation noted during stress.  Defect 1: There is a medium defect of moderate severity present in the basal inferior, basal inferolateral, mid inferior, mid inferolateral and apical inferior location.  While this may represent myocardial scar, significant amount of soft tissue attenuation artifact also noted.  This is an intermediate risk study. No evidence of ischemia.  Nuclear stress EF: 59%.  If no changes, I anticipate pt can proceed with surgery as scheduled.   Willeen Cass, FNP-BC [facility] Short Stay Surgical Center/Anesthesiology Phone: [phone] 07/09/2016 2:05 PM

## 2016-07-13 ENCOUNTER — Other Ambulatory Visit: Payer: Self-pay | Admitting: Neurological Surgery

## 2016-07-14 ENCOUNTER — Telehealth: Payer: Self-pay | Admitting: *Deleted

## 2016-07-14 NOTE — Telephone Encounter (Signed)
Patient scheduled for L4-5 posterior lateral fusion w/ laminectomy & pedicle screws.  Patient recently seen on 07/06/2016 & stress test on 07/07/2016.  Both your office note & stress test notes have given clearance for this patient and was sent to PCP & surgeon already.    Fax received today from [facility] inquiring about her Plavix.  Requesting to stop 1 week prior to surgical date.

## 2016-07-15 ENCOUNTER — Inpatient Hospital Stay (HOSPITAL_COMMUNITY): Admission: RE | Admit: 2016-07-15 | Payer: Medicare Other | Source: Ambulatory Visit | Admitting: Neurological Surgery

## 2016-07-15 ENCOUNTER — Encounter (HOSPITAL_COMMUNITY): Admission: RE | Payer: Self-pay | Source: Ambulatory Visit

## 2016-07-15 SURGERY — POSTERIOR LUMBAR FUSION 1 LEVEL
Anesthesia: General | Site: Back

## 2016-07-15 NOTE — Telephone Encounter (Signed)
Noted, will fax info back to [facility].

## 2016-07-15 NOTE — Telephone Encounter (Signed)
Yes, Plavix may be held 7 days prior to surgery as requested.

## 2016-08-12 ENCOUNTER — Other Ambulatory Visit: Payer: Self-pay | Admitting: Neurological Surgery

## 2016-08-22 ENCOUNTER — Emergency Department (HOSPITAL_COMMUNITY)
Admission: EM | Admit: 2016-08-22 | Discharge: 2016-08-22 | Disposition: A | Discharge status: Home / Self Care | Payer: Medicare Other | Attending: Emergency Medicine | Admitting: Emergency Medicine

## 2016-08-22 ENCOUNTER — Emergency Department (HOSPITAL_COMMUNITY): Payer: Medicare Other

## 2016-08-22 ENCOUNTER — Encounter (HOSPITAL_COMMUNITY): Payer: Self-pay

## 2016-08-22 DIAGNOSIS — R0602 Shortness of breath: Secondary | ICD-10-CM | POA: Diagnosis not present

## 2016-08-22 DIAGNOSIS — I509 Heart failure, unspecified: Secondary | ICD-10-CM | POA: Insufficient documentation

## 2016-08-22 DIAGNOSIS — J449 Chronic obstructive pulmonary disease, unspecified: Secondary | ICD-10-CM | POA: Insufficient documentation

## 2016-08-22 DIAGNOSIS — I110 Hypertensive heart disease with heart failure: Secondary | ICD-10-CM | POA: Insufficient documentation

## 2016-08-22 DIAGNOSIS — I120 Hypertensive chronic kidney disease with stage 5 chronic kidney disease or end stage renal disease: Secondary | ICD-10-CM | POA: Diagnosis not present

## 2016-08-22 DIAGNOSIS — I2510 Atherosclerotic heart disease of native coronary artery without angina pectoris: Secondary | ICD-10-CM | POA: Diagnosis not present

## 2016-08-22 DIAGNOSIS — Z79899 Other long term (current) drug therapy: Secondary | ICD-10-CM | POA: Diagnosis not present

## 2016-08-22 DIAGNOSIS — R069 Unspecified abnormalities of breathing: Secondary | ICD-10-CM | POA: Diagnosis not present

## 2016-08-22 LAB — CBC
HCT: 30.2 % — ABNORMAL LOW (ref 36.0–46.0)
HEMOGLOBIN: 9.4 g/dL — AB (ref 12.0–15.0)
MCH: 29.4 pg (ref 26.0–34.0)
MCHC: 31.1 g/dL (ref 30.0–36.0)
MCV: 94.4 fL (ref 78.0–100.0)
Platelets: 207 10*3/uL (ref 150–400)
RBC: 3.2 MIL/uL — ABNORMAL LOW (ref 3.87–5.11)
RDW: 16.1 % — ABNORMAL HIGH (ref 11.5–15.5)
WBC: 14.2 10*3/uL — ABNORMAL HIGH (ref 4.0–10.5)

## 2016-08-22 LAB — COMPREHENSIVE METABOLIC PANEL
ALK PHOS: 85 U/L (ref 38–126)
ALT: 12 U/L — ABNORMAL LOW (ref 14–54)
ANION GAP: 7 (ref 5–15)
AST: 17 U/L (ref 15–41)
Albumin: 3.5 g/dL (ref 3.5–5.0)
BUN: 20 mg/dL (ref 6–20)
CALCIUM: 9.3 mg/dL (ref 8.9–10.3)
CO2: 25 mmol/L (ref 22–32)
Chloride: 111 mmol/L (ref 101–111)
Creatinine, Ser: 1.32 mg/dL — ABNORMAL HIGH (ref 0.44–1.00)
GFR, EST AFRICAN AMERICAN: 42 mL/min — AB (ref >60)
GFR, EST NON AFRICAN AMERICAN: 36 mL/min — AB (ref >60)
Glucose, Bld: 101 mg/dL — ABNORMAL HIGH (ref 65–99)
Potassium: 4.3 mmol/L (ref 3.5–5.1)
SODIUM: 143 mmol/L (ref 135–145)
TOTAL PROTEIN: 7.3 g/dL (ref 6.5–8.1)
Total Bilirubin: 0.4 mg/dL (ref 0.3–1.2)

## 2016-08-22 LAB — I-STAT TROPONIN, ED
TROPONIN I, POC: 0.02 ng/mL (ref 0.00–0.08)
Troponin i, poc: 0.02 ng/mL (ref 0.00–0.08)

## 2016-08-22 LAB — D-DIMER, QUANTITATIVE: D-Dimer, Quant: 2.4 ug/mL-FEU — ABNORMAL HIGH (ref 0.00–0.50)

## 2016-08-22 LAB — BRAIN NATRIURETIC PEPTIDE: B NATRIURETIC PEPTIDE 5: 383 pg/mL — AB (ref 0.0–100.0)

## 2016-08-22 MED ORDER — FUROSEMIDE 10 MG/ML IJ SOLN
60.0000 mg | Freq: Once | INTRAMUSCULAR | Status: AC
  Administered 2016-08-22: 60 mg via INTRAVENOUS
  Filled 2016-08-22: qty 6

## 2016-08-22 MED ORDER — METHYLPREDNISOLONE SODIUM SUCC 125 MG IJ SOLR
125.0000 mg | Freq: Once | INTRAMUSCULAR | Status: AC
  Administered 2016-08-22: 125 mg via INTRAVENOUS
  Filled 2016-08-22: qty 2

## 2016-08-22 MED ORDER — FUROSEMIDE 40 MG PO TABS: 40.0000 mg | ORAL_TABLET | Freq: Every day | ORAL | 0 refills | Status: DC

## 2016-08-22 MED ORDER — IOPAMIDOL (ISOVUE-370) INJECTION 76%
80.0000 mL | Freq: Once | INTRAVENOUS | Status: AC | PRN
  Administered 2016-08-22: 80 mL via INTRAVENOUS

## 2016-08-22 MED ORDER — IPRATROPIUM-ALBUTEROL 0.5-2.5 (3) MG/3ML IN SOLN
3.0000 mL | Freq: Once | RESPIRATORY_TRACT | Status: AC
  Administered 2016-08-22: 3 mL via RESPIRATORY_TRACT
  Filled 2016-08-22: qty 3

## 2016-08-22 NOTE — ED Provider Notes (Signed)
[facility] DEPT Provider Note   CSN: [id number] Arrival date & time: 08/22/16  1426     History   Chief Complaint Chief Complaint  Patient presents with  . Shortness of Breath    HPI Alison [name] is a 82 y.o. female.   Shortness of Breath  This is a new problem. The average episode lasts 2 weeks. The problem occurs continuously.The current episode started more than 1 week ago. The problem has been gradually worsening. Associated symptoms include cough and orthopnea. Pertinent negatives include no fever, no rhinorrhea and no abdominal pain. She has tried nothing for the symptoms. She has had no prior hospitalizations. She has had no prior ED visits. She has had no prior ICU admissions. Associated medical issues include COPD, PE and CAD.    Past Medical History:  Diagnosis Date  . Chronic anemia    takes Iron pill daily  . Chronic back pain    stenosis/spondylolisthesis  . COPD (chronic obstructive pulmonary disease) ([place])   . Coronary atherosclerosis of native coronary artery    Occluded circ with collaterals and 90% RCA (previous stents at [facility], details not clear), LVEF 48% 1/11 - inferior and inferolateral scar by Myoview 1/11,   . Gout    takes Allopurinol daily  . History of blood clots seeral yrs ago   abdomen  . History of blood transfusion    no abnormal reaction noted  . Hyperlipidemia    takes Crestor daily  . Hypertension    takes Losartan and Metoprolol daily  . Myocardial infarction 1997  . Nocturia   . Osteoarthritis   . Peripheral edema    takes Lasix daily  . Pneumonia    hx of--f20+ yrs ago  . 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 Hypocalcemia 09/03/2013  . Leg edema 04/06/2013  . History of pulmonary embolus (PE) 01/05/2011  . HYPERLIPIDEMIA-MIXED 06/24/2009  . CAD, NATIVE VESSEL 06/24/2009    Past  Surgical History:  Procedure Laterality Date  . ABDOMINAL HYSTERECTOMY  1965   partial  . APPENDECTOMY    . BACK SURGERY    . cataract surgery Bilateral   . CORONARY ANGIOPLASTY  1997   3 stents  . cyst removed from back  2006  . Left adrenalectomy  2009  . Right breast biopsy  2008  . Right knee replacement  2011    OB History    No data available       Home Medications    Prior to Admission medications   Medication Sig Start Date End Date Taking? Authorizing Provider  acetaminophen (TYLENOL) 650 MG CR tablet Take 1,300 mg by mouth daily.   Yes Historical Provider, MD  ADVAIR DISKUS 250-50 MCG/DOSE AEPB Inhale 1 Inhaler into the lungs daily as needed (wheezing).  09/10/12  Yes Historical Provider, MD  allopurinol (ZYLOPRIM) 100 MG tablet Take 100 mg by mouth daily.   Yes Historical Provider, MD  Cholecalciferol (VITAMIN D3) 1000 units CAPS Take 1 capsule by mouth daily.   Yes Historical Provider, MD  clopidogrel (PLAVIX) 75 MG tablet TAKE 1 TABLET BY MOUTH EVERY DAY 01/26/16  Yes Satira Sark, MD  CRESTOR 10 MG tablet TAKE 1 TABLET EVERY DAY Patient taking differently: TAKE 1 TABLET EVERY DAY IN THE MORNING 01/26/16  Yes Satira Sark, MD  ferrous sulfate 325 (65  FE) MG tablet Take 325 mg by mouth daily.    Yes Historical Provider, MD  losartan (COZAAR) 25 MG tablet TAKE 1 TABLET BY MOUTH EVERY DAY 10/29/15  Yes Satira Sark, MD  metoprolol succinate (TOPROL-XL) 50 MG 24 hr tablet TAKE 1 TABLET EVERY DAY WITH OR IMMEDIATELY FOLLOWING A MEAL 01/26/16  Yes Satira Sark, MD  nitroGLYCERIN (NITROSTAT) 0.4 MG SL tablet Place 0.4 mg under the tongue every 5 (five) minutes as needed for chest pain.    Yes Historical Provider, MD  furosemide (LASIX) 40 MG tablet Take 1 tablet (40 mg total) by mouth daily. Take 1 tablet twice a day for Nov 13, 14, 15 then take one daily. 08/22/16   Merrily Pew, MD    Family History Family History  Problem Relation Age of Onset  .  Stroke Father     Social History Social History  Substance Use Topics  . Smoking status: Never Smoker  . Smokeless tobacco: Never Used  . Alcohol use No     Allergies   Patient has no known allergies.   Review of Systems Review of Systems  Constitutional: Negative for fever.  HENT: Negative for rhinorrhea.   Eyes: Negative for pain.  Respiratory: Positive for cough and shortness of breath.   Cardiovascular: Positive for orthopnea.  Gastrointestinal: Negative for abdominal pain.  Endocrine: Negative for polyuria.  Genitourinary: Negative for flank pain.  All other systems reviewed and are negative.    Physical Exam Updated Vital Signs BP 138/74 (BP Location: Right Arm)   Pulse 83   Temp 98.4 F (36.9 C) (Oral)   Resp 26   Ht 5\' 1"  (1.549 m)   Wt 224 lb (101.6 kg)   SpO2 96%   BMI 42.32 kg/m   Physical Exam  Constitutional: She appears well-developed and well-nourished.  HENT:  Head: Normocephalic and atraumatic.  Eyes: Conjunctivae and EOM are normal.  Neck: Normal range of motion.  Cardiovascular: Normal rate and regular rhythm.   No murmur heard. Pulmonary/Chest: Effort normal. No stridor. No respiratory distress. She has decreased breath sounds (RML).  Abdominal: Soft. She exhibits distension (mild).  Musculoskeletal: She exhibits no edema or deformity.  Neurological: She is alert.  Skin: Skin is warm and dry.  Nursing note and vitals reviewed.    ED Treatments / Results  Labs (all labs ordered are listed, but only abnormal results are displayed) Labs Reviewed  CBC - Abnormal; Notable for the following:       Result Value   WBC 14.2 (*)    RBC 3.20 (*)    Hemoglobin 9.4 (*)    HCT 30.2 (*)    RDW 16.1 (*)    All other components within normal limits  COMPREHENSIVE METABOLIC PANEL - Abnormal; Notable for the following:    Glucose, Bld 101 (*)    Creatinine, Ser 1.32 (*)    ALT 12 (*)    GFR calc non Af Amer 36 (*)    GFR calc Af Amer 42  (*)    All other components within normal limits  BRAIN NATRIURETIC PEPTIDE - Abnormal; Notable for the following:    B Natriuretic Peptide 383.0 (*)    All other components within normal limits  D-DIMER, QUANTITATIVE (NOT AT [facility]) - Abnormal; Notable for the following:    D-Dimer, Quant 2.40 (*)    All other components within normal limits  I-STAT TROPOININ, ED  I-STAT TROPOININ, ED    EKG  EKG Interpretation None  Radiology Dg Chest 2 View  Result Date: 08/22/2016 CLINICAL DATA:  Patient with worsening shortness of breath. Evaluate for pneumonia. EXAM: CHEST  2 VIEW COMPARISON:  Chest radiograph 03/12/2011. FINDINGS: Limited exam secondary to body habitus and patient positioning. Monitoring leads overlie the patient. Cardiomegaly. Tortuosity and calcification of the thoracic aorta. Bilateral mid and lower lung heterogeneous pulmonary opacities. Small bilateral pleural effusions. Thoracic spine degenerative changes. IMPRESSION: Cardiomegaly. Bibasilar heterogeneous opacities favored to represent atelectasis. Small bilateral pleural effusions. Exam limited secondary to body habitus and patient positioning. Electronically Signed   By: Lovey Newcomer M.D.   On: 08/22/2016 15:27   Ct Angio Chest Pe W And/or Wo Contrast  Result Date: 08/22/2016 CLINICAL DATA:  Two week history of shortness of breath. Elevated D-dimer. EXAM: CT ANGIOGRAPHY CHEST WITH CONTRAST TECHNIQUE: Multidetector CT imaging of the chest was performed using the standard protocol during bolus administration of intravenous contrast. Multiplanar CT image reconstructions and MIPs were obtained to evaluate the vascular anatomy. CONTRAST:  Contrast 80 cc Isovue 370 COMPARISON:  Standard CT chest 07/21/2007. Abdomen and pelvis CT 10/17/2014. FINDINGS: Cardiovascular: Heart is enlarged. Coronary artery calcification is noted. No evidence of pericardial effusion. Atherosclerotic calcification is noted in the wall of the thoracic  aorta. No filling defect within the opacified pulmonary arteries to suggest the presence of an acute pulmonary embolus. Mediastinum/Nodes: 9 mm short axis prevascular lymph node is unchanged. No mediastinal lymphadenopathy There is no hilar lymphadenopathy. The esophagus has normal imaging features. There is no axillary lymphadenopathy. Lungs/Pleura: Centrilobular and paraseptal emphysema noted bilaterally. Subsegmental atelectasis or linear scarring noted in both lower lungs with compressive atelectasis in the lower lobes bilaterally. Tiny bilateral pleural effusions are evident. Upper Abdomen: 15 mm right adrenal nodule cannot be further characterized on today's study. This is stable since 10/17/2014 when imaging features allow characterization as benign adrenal adenoma. Left adrenal gland is unremarkable. Multiple right renal cysts are evident. Atherosclerotic calcification noted in the abdominal aorta, incompletely visualized. Musculoskeletal: Bone windows reveal no worrisome lytic or sclerotic osseous lesions. Superior endplate compression deformity at T11 is stable. Review of the MIP images confirms the above findings. IMPRESSION: 1. No CT evidence for acute pulmonary embolus. 2. Compressive atelectasis with tiny bilateral pleural effusions. 3. 15 mm right adrenal adenoma. Electronically Signed   By: Misty Stanley M.D.   On: 08/22/2016 18:09    Procedures Procedures (including critical care time)  Medications Ordered in ED Medications  ipratropium-albuterol (DUONEB) 0.5-2.5 (3) MG/3ML nebulizer solution 3 mL (3 mLs Nebulization Given 08/22/16 1524)  methylPREDNISolone sodium succinate (SOLU-MEDROL) 125 mg/2 mL injection 125 mg (125 mg Intravenous Given 08/22/16 1538)  iopamidol (ISOVUE-370) 76 % injection 80 mL (80 mLs Intravenous Contrast Given 08/22/16 1737)  furosemide (LASIX) injection 60 mg (60 mg Intravenous Given 08/22/16 1835)     Initial Impression / Assessment and Plan / ED Course  I  have reviewed the triage vital signs and the nursing notes.  Pertinent labs & imaging results that were available during my care of the patient were reviewed by me and considered in my medical decision making (see chart for details).  Clinical Course    Will treat for COPD exacerbation and workup for other emergent causes she is at risk for, i.e. ACS, PE, pneumonia.  Likely CHF. Negative PE, penumonia. Delta troponin negative, doubt ACS. Dose of iv lasix given, will monitor.   Good UOP with lasix, will restart the same (apparently had not been taking it at home, but did  not divulge that information on initial exam) with increased dose for first few days then back to normal dose. Will follow up with PCP or cardiologist in a few days to ensure improvement and check potassium. (normal today).   Final Clinical Impressions(s) / ED Diagnoses   Final diagnoses:  Acute on chronic congestive heart failure, unspecified congestive heart failure type [facility])    New Prescriptions Discharge Medication List as of 08/22/2016  8:19 PM       Merrily Pew, MD 08/22/16 2055

## 2016-08-22 NOTE — ED Triage Notes (Signed)
Patient from home by [facility] for shortness of breath x2 weeks worse at night.  Reports of having some congestion. EMS gave 1 albuterol treatment which patient reports helped. Denies fever.

## 2016-08-22 NOTE — ED Notes (Signed)
ED Provider at bedside. 

## 2016-08-22 NOTE — Progress Notes (Signed)
Pre tx peakflow 140 and post ts peakflow 140. Patient had good technique and gave good effort.

## 2016-09-08 ENCOUNTER — Encounter (HOSPITAL_COMMUNITY): Payer: Self-pay

## 2016-09-08 ENCOUNTER — Encounter (HOSPITAL_COMMUNITY)
Admission: RE | Admit: 2016-09-08 | Discharge: 2016-09-08 | Disposition: A | Discharge status: Home / Self Care | Payer: Medicare Other | Source: Ambulatory Visit | Attending: Neurological Surgery | Admitting: Neurological Surgery

## 2016-09-08 ENCOUNTER — Ambulatory Visit (HOSPITAL_COMMUNITY)
Admission: RE | Admit: 2016-09-08 | Discharge: 2016-09-08 | Disposition: A | Discharge status: Home / Self Care | Payer: Medicare Other | Source: Ambulatory Visit | Attending: Neurological Surgery | Admitting: Neurological Surgery

## 2016-09-08 DIAGNOSIS — I2510 Atherosclerotic heart disease of native coronary artery without angina pectoris: Secondary | ICD-10-CM | POA: Diagnosis not present

## 2016-09-08 DIAGNOSIS — M4316 Spondylolisthesis, lumbar region: Secondary | ICD-10-CM | POA: Diagnosis not present

## 2016-09-08 DIAGNOSIS — Z01812 Encounter for preprocedural laboratory examination: Secondary | ICD-10-CM | POA: Insufficient documentation

## 2016-09-08 DIAGNOSIS — Z01818 Encounter for other preprocedural examination: Secondary | ICD-10-CM | POA: Insufficient documentation

## 2016-09-08 DIAGNOSIS — R05 Cough: Secondary | ICD-10-CM | POA: Diagnosis not present

## 2016-09-08 DIAGNOSIS — M1990 Unspecified osteoarthritis, unspecified site: Secondary | ICD-10-CM | POA: Diagnosis not present

## 2016-09-08 DIAGNOSIS — I110 Hypertensive heart disease with heart failure: Secondary | ICD-10-CM | POA: Diagnosis not present

## 2016-09-08 DIAGNOSIS — I517 Cardiomegaly: Secondary | ICD-10-CM | POA: Insufficient documentation

## 2016-09-08 DIAGNOSIS — Z955 Presence of coronary angioplasty implant and graft: Secondary | ICD-10-CM | POA: Diagnosis not present

## 2016-09-08 DIAGNOSIS — Z823 Family history of stroke: Secondary | ICD-10-CM | POA: Diagnosis not present

## 2016-09-08 DIAGNOSIS — I700 Atherosclerosis of aorta: Secondary | ICD-10-CM

## 2016-09-08 DIAGNOSIS — Z7902 Long term (current) use of antithrombotics/antiplatelets: Secondary | ICD-10-CM | POA: Diagnosis not present

## 2016-09-08 DIAGNOSIS — Z96651 Presence of right artificial knee joint: Secondary | ICD-10-CM | POA: Diagnosis not present

## 2016-09-08 DIAGNOSIS — Z86711 Personal history of pulmonary embolism: Secondary | ICD-10-CM | POA: Diagnosis not present

## 2016-09-08 DIAGNOSIS — M48061 Spinal stenosis, lumbar region without neurogenic claudication: Secondary | ICD-10-CM | POA: Diagnosis not present

## 2016-09-08 DIAGNOSIS — I509 Heart failure, unspecified: Secondary | ICD-10-CM | POA: Diagnosis not present

## 2016-09-08 DIAGNOSIS — M549 Dorsalgia, unspecified: Secondary | ICD-10-CM | POA: Diagnosis not present

## 2016-09-08 DIAGNOSIS — Z993 Dependence on wheelchair: Secondary | ICD-10-CM | POA: Diagnosis not present

## 2016-09-08 DIAGNOSIS — G8929 Other chronic pain: Secondary | ICD-10-CM | POA: Diagnosis not present

## 2016-09-08 DIAGNOSIS — M4310 Spondylolisthesis, site unspecified: Secondary | ICD-10-CM | POA: Insufficient documentation

## 2016-09-08 DIAGNOSIS — Z23 Encounter for immunization: Secondary | ICD-10-CM | POA: Diagnosis not present

## 2016-09-08 DIAGNOSIS — J449 Chronic obstructive pulmonary disease, unspecified: Secondary | ICD-10-CM | POA: Insufficient documentation

## 2016-09-08 DIAGNOSIS — M109 Gout, unspecified: Secondary | ICD-10-CM | POA: Diagnosis not present

## 2016-09-08 DIAGNOSIS — D649 Anemia, unspecified: Secondary | ICD-10-CM | POA: Diagnosis not present

## 2016-09-08 DIAGNOSIS — Z86718 Personal history of other venous thrombosis and embolism: Secondary | ICD-10-CM | POA: Diagnosis not present

## 2016-09-08 DIAGNOSIS — E785 Hyperlipidemia, unspecified: Secondary | ICD-10-CM | POA: Diagnosis not present

## 2016-09-08 DIAGNOSIS — I252 Old myocardial infarction: Secondary | ICD-10-CM | POA: Diagnosis not present

## 2016-09-08 LAB — BASIC METABOLIC PANEL
Anion gap: 10 (ref 5–15)
BUN: 28 mg/dL — AB (ref 6–20)
CHLORIDE: 108 mmol/L (ref 101–111)
CO2: 23 mmol/L (ref 22–32)
CREATININE: 1.46 mg/dL — AB (ref 0.44–1.00)
Calcium: 9.1 mg/dL (ref 8.9–10.3)
GFR calc Af Amer: 37 mL/min — ABNORMAL LOW (ref >60)
GFR calc non Af Amer: 32 mL/min — ABNORMAL LOW (ref >60)
Glucose, Bld: 93 mg/dL (ref 65–99)
POTASSIUM: 4.2 mmol/L (ref 3.5–5.1)
Sodium: 141 mmol/L (ref 135–145)

## 2016-09-08 LAB — CBC WITH DIFFERENTIAL/PLATELET
Basophils Absolute: 0 10*3/uL (ref 0.0–0.1)
Basophils Relative: 0 %
EOS ABS: 0.3 10*3/uL (ref 0.0–0.7)
EOS PCT: 3 %
HCT: 30.6 % — ABNORMAL LOW (ref 36.0–46.0)
HEMOGLOBIN: 9.6 g/dL — AB (ref 12.0–15.0)
LYMPHS ABS: 2.4 10*3/uL (ref 0.7–4.0)
LYMPHS PCT: 21 %
MCH: 28.7 pg (ref 26.0–34.0)
MCHC: 31.4 g/dL (ref 30.0–36.0)
MCV: 91.6 fL (ref 78.0–100.0)
MONOS PCT: 4 %
Monocytes Absolute: 0.5 10*3/uL (ref 0.1–1.0)
NEUTROS PCT: 72 %
Neutro Abs: 8 10*3/uL — ABNORMAL HIGH (ref 1.7–7.7)
Platelets: 200 10*3/uL (ref 150–400)
RBC: 3.34 MIL/uL — AB (ref 3.87–5.11)
RDW: 16.3 % — ABNORMAL HIGH (ref 11.5–15.5)
WBC: 11.2 10*3/uL — ABNORMAL HIGH (ref 4.0–10.5)

## 2016-09-08 LAB — SURGICAL PCR SCREEN
MRSA, PCR: NEGATIVE
Staphylococcus aureus: NEGATIVE

## 2016-09-08 LAB — PROTIME-INR
INR: 1.11
PROTHROMBIN TIME: 14.4 s (ref 11.4–15.2)

## 2016-09-08 LAB — PREPARE RBC (CROSSMATCH)

## 2016-09-08 NOTE — Progress Notes (Signed)
Ebony Hail zelenak pa to consult with patient re: recent ed  visit

## 2016-09-08 NOTE — Pre-Procedure Instructions (Addendum)
Alison [name]  09/08/2016      [id number] - [place], [place] [address] [place] [address] Phone: [phone] Fax: [phone]    Your procedure is scheduled on 09/10/16  Report to [facility] Admitting at 745 A.M.  Call this number if you have problems the morning of surgery:  [phone]   Remember:  Do not eat food or drink liquids after midnight.  Take these medicines the morning of surgery with A SIP OF WATER advair if needed, allopurinol,metoprolol, nitro if needed  STOP all herbel meds, nsaids (aleve,naproxen,advil,ibuprofen)  NOW including vitamins   Do not wear jewelry, make-up or nail polish.  Do not wear lotions, powders, or perfumes, or deoderant.  Do not shave 48 hours prior to surgery.  Men may shave face and neck.  Do not bring valuables to the hospital.  [facility] is not responsible for any belongings or valuables.  Contacts, dentures or bridgework may not be worn into surgery.  Leave your suitcase in the car.  After surgery it may be brought to your room.  For patients admitted to the hospital, discharge time will be determined by your treatment team.  Patients discharged the day of surgery will not be allowed to drive home.   Special instructions:   Special Instructions: [place] - Preparing for Surgery  Before surgery, you can play an important role.  Because skin is not sterile, your skin needs to be as free of germs as possible.  You can reduce the number of germs on you skin by washing with CHG (chlorahexidine gluconate) soap before surgery.  CHG is an antiseptic cleaner which kills germs and bonds with the skin to continue killing germs even after washing.  Please DO NOT use if you have an allergy to CHG or antibacterial soaps.  If your skin becomes reddened/irritated stop using the CHG and inform your nurse when you arrive at Short Stay.  Do not shave (including  legs and underarms) for at least 48 hours prior to the first CHG shower.  You may shave your face.  Please follow these instructions carefully:   1.  Shower with CHG Soap the night before surgery and the morning of Surgery.  2.  If you choose to wash your hair, wash your hair first as usual with your normal shampoo.  3.  After you shampoo, rinse your hair and body thoroughly to remove the Shampoo.  4.  Use CHG as you would any other liquid soap.  You can apply chg directly  to the skin and wash gently with scrungie or a clean washcloth.  5.  Apply the CHG Soap to your body ONLY FROM THE NECK DOWN.  Do not use on open wounds or open sores.  Avoid contact with your eyes ears, mouth and genitals (private parts).  Wash genitals (private parts)       with your normal soap.  6.  Wash thoroughly, paying special attention to the area where your surgery will be performed.  7.  Thoroughly rinse your body with warm water from the neck down.  8.  DO NOT shower/wash with your normal soap after using and rinsing off the CHG Soap.  9.  Pat yourself dry with a clean towel.            10.  Wear clean pajamas.  11.  Place clean sheets on your bed the night of your first shower and do not sleep with pets.  Day of Surgery  Do not apply any lotions/deodorants the morning of surgery.  Please wear clean clothes to the hospital/surgery center.  Please read over the fact sheets that you were given.

## 2016-09-08 NOTE — Progress Notes (Signed)
Anesthesia Note: Patient is an 82 year old female scheduled for L4-5 posterior lateral fusion with laminectomy on 09/10/16 by Dr. Ronnald Ramp. Procedure was initially scheduled for 07/15/2016, but was postponed due to her insurance company not initially approving the surgery.    History includes non-smoker, CAD/MI X "2" s/p "3 stents" '97, HTN, hyperlipidemia, COPD, recurrent RLE DVT/saddle PE '08 s/p IVC filter 10/2007, chronic back pain, peripheral edema, anemia, left adrenalectomy ("cyst") '09, right TKA '11, appendectomy, hysterectomy, back surgery '12 (Dr. Carloyn Manner, [facility]). BMI is consistent with morbid obesity.   - PCP is [name], MD. - Cardiologist is Rozann Lesches, MD, last office visit 07/06/16.  Dr. Domenic Polite cleared patient following a recent stress test. However, she was seen in the ED on 08/22/16 for worsening SOB X 2 weeks in the setting of Lasix non-compliance (ran out at home and her pharmacy said she was not due for a refill yet). She was treated for COPD exacerbation and acute on chronic CHF (small pleural effusions). Chest CTA was negative for PE. She has not been re-evaluated by her PCP or cardiologist since then, but says she feels back at her baseline. She denied SOB at rest and has stable DOE symptoms. No new edema. No chest pain or syncope. She sleeps on 2-3 pillows for back comfort as she has significant bilateral hip to ankle pain related to her back. Back pain limits her walking to < 5 minutes at a time. She is recovering from a cold from last week, with symptoms of watery eyes, sneezes, but no fever. She still has an intermittent productive cough (clear sputum), but is not wheezes and it does not seem excessive and is not keeping her up at night.    Medications include Advair, allopurinol, Plavix (last dose 09/03/16), Crestor, 65 Fe, Lasix, losartan, Toprol XL, Nitro.   BP (!) 154/67   Pulse 63   Temp 36.6 C   Resp 20   Ht 5\' 1"  (1.549 m)   Wt 221 lb (100.2 kg)   SpO2  100%   BMI 41.76 kg/m  On exam, she is a pleasant black female in NAD. She is a little hard of hearing. No conversational dyspnea noted. She reports upper and lower dentures. No carotid bruits auscultated. Heart RRR. I did not appreciate a murmur. Lungs clear. Legs are large, but no pretibial pitting edema.    EKG 08/22/16: SR at 79 bpm, non-specific IVCD, anterior infarct (old). No significant change since last tracing.  Nuclear stress test 07/07/16:   There was no ST segment deviation noted during stress.  Defect 1: There is a medium defect of moderate severity present in the basal inferior, basal inferolateral, mid inferior, mid inferolateral and apical inferior location.  While this may represent myocardial scar, significant amount of soft tissue attenuation artifact also noted.  This is an intermediate risk study. No evidence of ischemia.  Nuclear stress EF: 59%. Dr. Domenic Polite reviewed and wrote, "Results reviewed. I reviewed the images myself. Study is consistent with component of inferior wall scar as well as attenuation artifact, but importantly no substantial degree of ischemia in this distribution. I would have graded the test as low risk overall, particularly with normal LVEF 59%. She should be able to proceed with planned surgery at an acceptable perioperative cardiac risk."  Echo 10/21/06 (in the setting of acute DVT/PE: [facility]): Conclusions:  1. Normal left ventricular wall thickness and chamber size with basal inferior posterior akinesis and ejection fraction 55-60% and grade  1 diastolic dysfunction. 2. Trace mitral regurgitation. 3. Moderate right ventricular enlargement with decreased contraction. There is mild tricuspid and trace pulmonic regurgitation with bowing of the intra-atrial septum from right to left suggesting increased right sided pressures. 4. Small posterior pericardial effusion.  According to cardiology notes "CFX 100% with left  collateralization, 90% proximal RCA by cardiac catheterization, October 1997 [facility])."  CXR 09/08/16: FINDINGS: The lungs are well-expanded. The cardiac silhouette remains enlarged. The pulmonary interstitial markings are mildly prominent but have improved significantly since the previous study. There is no pulmonary vascular congestion. There is no pleural effusion. There is calcification in the wall of the aortic arch. There is multilevel degenerative disc disease of the thoracic spine. IMPRESSION: COPD. Cardiomegaly without pulmonary edema. No evidence of pneumonia or pleural effusion. Thoracic aortic atherosclerosis.  CTA Chest 08/22/16: IMPRESSION: 1. No CT evidence for acute pulmonary embolus. 2. Compressive atelectasis with tiny bilateral pleural effusions. 3. 15 mm right adrenal adenoma.  Preoperative labs reviewed.  BUN 28, Cr 1.46, which is consistent with labs since 07/08/16. Glucose 93. H/H 9.6/30.6, MCV 91.6. H/H results are consistent (slightly better) with results since 07/08/16. She was seen by hematologist Dr. Zola Button for anemia back in 2010-2011. In review of labs in [place] since 06/2007, HGB has been primarily in the 10 range. She has a T&S/T&C order from Dr. Ronnald Ramp. Defer decision for transfusion to surgeon and/or anesthesiologist. (I did call HGB results to [facility] at Dr. Ronnald Ramp' office.)  Above reviewed with anesthesiologist Dr. Kalman Shan. Patient was previously cleared for surgery following recent stress test. Her breathing is back to baseline since Lasix resumed. No wheezing on exam, and chest xray shows small pleural effusions have resolved. If no acute changes or new cardiopulmonary symptoms then it is anticipated that she can proceed as planned.  George Hugh [facility] Short Stay Center/Anesthesiology Phone [phone] 09/08/2016 2:49 PM

## 2016-09-10 ENCOUNTER — Inpatient Hospital Stay (HOSPITAL_COMMUNITY): Payer: Medicare Other | Admitting: Vascular Surgery

## 2016-09-10 ENCOUNTER — Inpatient Hospital Stay (HOSPITAL_COMMUNITY)
Admission: RE | Admit: 2016-09-10 | Discharge: 2016-09-13 | DRG: 460 | Disposition: A | Discharge status: Skilled Nursing Facility | Payer: Medicare Other | Source: Ambulatory Visit | Attending: Neurological Surgery | Admitting: Neurological Surgery

## 2016-09-10 ENCOUNTER — Encounter (HOSPITAL_COMMUNITY)
Admission: RE | Disposition: A | Discharge status: Skilled Nursing Facility | Payer: Self-pay | Source: Ambulatory Visit | Attending: Neurological Surgery

## 2016-09-10 ENCOUNTER — Encounter (HOSPITAL_COMMUNITY): Payer: Self-pay | Admitting: *Deleted

## 2016-09-10 ENCOUNTER — Inpatient Hospital Stay (HOSPITAL_COMMUNITY): Payer: Medicare Other

## 2016-09-10 DIAGNOSIS — D649 Anemia, unspecified: Secondary | ICD-10-CM | POA: Diagnosis present

## 2016-09-10 DIAGNOSIS — I509 Heart failure, unspecified: Secondary | ICD-10-CM | POA: Diagnosis present

## 2016-09-10 DIAGNOSIS — Z981 Arthrodesis status: Secondary | ICD-10-CM

## 2016-09-10 DIAGNOSIS — M109 Gout, unspecified: Secondary | ICD-10-CM | POA: Diagnosis present

## 2016-09-10 DIAGNOSIS — J449 Chronic obstructive pulmonary disease, unspecified: Secondary | ICD-10-CM | POA: Diagnosis not present

## 2016-09-10 DIAGNOSIS — Z23 Encounter for immunization: Secondary | ICD-10-CM

## 2016-09-10 DIAGNOSIS — M1990 Unspecified osteoarthritis, unspecified site: Secondary | ICD-10-CM | POA: Diagnosis not present

## 2016-09-10 DIAGNOSIS — M6281 Muscle weakness (generalized): Secondary | ICD-10-CM | POA: Diagnosis not present

## 2016-09-10 DIAGNOSIS — Z955 Presence of coronary angioplasty implant and graft: Secondary | ICD-10-CM

## 2016-09-10 DIAGNOSIS — M549 Dorsalgia, unspecified: Secondary | ICD-10-CM | POA: Diagnosis present

## 2016-09-10 DIAGNOSIS — M48061 Spinal stenosis, lumbar region without neurogenic claudication: Secondary | ICD-10-CM | POA: Diagnosis present

## 2016-09-10 DIAGNOSIS — Z823 Family history of stroke: Secondary | ICD-10-CM

## 2016-09-10 DIAGNOSIS — M545 Low back pain: Secondary | ICD-10-CM | POA: Diagnosis not present

## 2016-09-10 DIAGNOSIS — Z96651 Presence of right artificial knee joint: Secondary | ICD-10-CM | POA: Diagnosis present

## 2016-09-10 DIAGNOSIS — M1000 Idiopathic gout, unspecified site: Secondary | ICD-10-CM | POA: Diagnosis not present

## 2016-09-10 DIAGNOSIS — Z86711 Personal history of pulmonary embolism: Secondary | ICD-10-CM

## 2016-09-10 DIAGNOSIS — G894 Chronic pain syndrome: Secondary | ICD-10-CM | POA: Diagnosis not present

## 2016-09-10 DIAGNOSIS — I110 Hypertensive heart disease with heart failure: Secondary | ICD-10-CM | POA: Diagnosis present

## 2016-09-10 DIAGNOSIS — E782 Mixed hyperlipidemia: Secondary | ICD-10-CM | POA: Diagnosis not present

## 2016-09-10 DIAGNOSIS — I252 Old myocardial infarction: Secondary | ICD-10-CM | POA: Diagnosis not present

## 2016-09-10 DIAGNOSIS — I2510 Atherosclerotic heart disease of native coronary artery without angina pectoris: Secondary | ICD-10-CM | POA: Diagnosis present

## 2016-09-10 DIAGNOSIS — Z7902 Long term (current) use of antithrombotics/antiplatelets: Secondary | ICD-10-CM | POA: Diagnosis not present

## 2016-09-10 DIAGNOSIS — Z419 Encounter for procedure for purposes other than remedying health state, unspecified: Secondary | ICD-10-CM

## 2016-09-10 DIAGNOSIS — Z0000 Encounter for general adult medical examination without abnormal findings: Secondary | ICD-10-CM | POA: Diagnosis not present

## 2016-09-10 DIAGNOSIS — M79606 Pain in leg, unspecified: Secondary | ICD-10-CM | POA: Diagnosis not present

## 2016-09-10 DIAGNOSIS — Z86718 Personal history of other venous thrombosis and embolism: Secondary | ICD-10-CM | POA: Diagnosis not present

## 2016-09-10 DIAGNOSIS — M4316 Spondylolisthesis, lumbar region: Secondary | ICD-10-CM | POA: Diagnosis present

## 2016-09-10 DIAGNOSIS — E785 Hyperlipidemia, unspecified: Secondary | ICD-10-CM | POA: Diagnosis not present

## 2016-09-10 DIAGNOSIS — R262 Difficulty in walking, not elsewhere classified: Secondary | ICD-10-CM | POA: Diagnosis not present

## 2016-09-10 DIAGNOSIS — G8929 Other chronic pain: Secondary | ICD-10-CM | POA: Diagnosis present

## 2016-09-10 DIAGNOSIS — Z993 Dependence on wheelchair: Secondary | ICD-10-CM | POA: Diagnosis not present

## 2016-09-10 DIAGNOSIS — M79605 Pain in left leg: Secondary | ICD-10-CM | POA: Diagnosis present

## 2016-09-10 DIAGNOSIS — R293 Abnormal posture: Secondary | ICD-10-CM | POA: Diagnosis not present

## 2016-09-10 DIAGNOSIS — Z9889 Other specified postprocedural states: Secondary | ICD-10-CM | POA: Diagnosis not present

## 2016-09-10 DIAGNOSIS — D509 Iron deficiency anemia, unspecified: Secondary | ICD-10-CM | POA: Diagnosis not present

## 2016-09-10 DIAGNOSIS — I10 Essential (primary) hypertension: Secondary | ICD-10-CM | POA: Diagnosis not present

## 2016-09-10 DIAGNOSIS — Z79899 Other long term (current) drug therapy: Secondary | ICD-10-CM | POA: Diagnosis not present

## 2016-09-10 HISTORY — PX: LAMINECTOMY WITH POSTERIOR LATERAL ARTHRODESIS LEVEL 1: SHX6335

## 2016-09-10 SURGERY — LAMINECTOMY WITH POSTERIOR LATERAL ARTHRODESIS LEVEL 1
Anesthesia: General | Site: Back

## 2016-09-10 MED ORDER — PROPOFOL 10 MG/ML IV BOLUS
INTRAVENOUS | Status: AC
  Filled 2016-09-10: qty 20

## 2016-09-10 MED ORDER — SODIUM CHLORIDE 0.9% FLUSH
3.0000 mL | Freq: Two times a day (BID) | INTRAVENOUS | Status: DC
  Administered 2016-09-11 – 2016-09-13 (×4): 3 mL via INTRAVENOUS

## 2016-09-10 MED ORDER — LIDOCAINE-EPINEPHRINE (PF) 2 %-1:200000 IJ SOLN
INTRAMUSCULAR | Status: AC
  Filled 2016-09-10: qty 20

## 2016-09-10 MED ORDER — NITROGLYCERIN 0.4 MG SL SUBL: 0.4000 mg | SUBLINGUAL_TABLET | SUBLINGUAL | Status: DC | PRN

## 2016-09-10 MED ORDER — THROMBIN 20000 UNITS EX SOLR
CUTANEOUS | Status: AC
  Filled 2016-09-10: qty 20000

## 2016-09-10 MED ORDER — CEFAZOLIN IN D5W 1 GM/50ML IV SOLN
1.0000 g | Freq: Three times a day (TID) | INTRAVENOUS | Status: AC
  Administered 2016-09-10 – 2016-09-11 (×2): 1 g via INTRAVENOUS
  Filled 2016-09-10 (×2): qty 50

## 2016-09-10 MED ORDER — VANCOMYCIN HCL 1000 MG IV SOLR
INTRAVENOUS | Status: AC
  Filled 2016-09-10: qty 1000

## 2016-09-10 MED ORDER — PROMETHAZINE HCL 25 MG/ML IJ SOLN: 6.2500 mg | INTRAMUSCULAR | Status: DC | PRN

## 2016-09-10 MED ORDER — DEXAMETHASONE SODIUM PHOSPHATE 10 MG/ML IJ SOLN
10.0000 mg | INTRAMUSCULAR | Status: AC
  Administered 2016-09-10: 10 mg via INTRAVENOUS
  Filled 2016-09-10: qty 1

## 2016-09-10 MED ORDER — THROMBIN 5000 UNITS EX SOLR
CUTANEOUS | Status: AC
  Filled 2016-09-10: qty 5000

## 2016-09-10 MED ORDER — FENTANYL CITRATE (PF) 250 MCG/5ML IJ SOLN
INTRAMUSCULAR | Status: DC | PRN
  Administered 2016-09-10 (×4): 50 ug via INTRAVENOUS

## 2016-09-10 MED ORDER — FUROSEMIDE 40 MG PO TABS
40.0000 mg | ORAL_TABLET | Freq: Every day | ORAL | Status: DC
  Administered 2016-09-10 – 2016-09-11 (×2): 40 mg via ORAL
  Filled 2016-09-10 (×2): qty 1

## 2016-09-10 MED ORDER — LACTATED RINGERS IV SOLN: INTRAVENOUS | Status: DC

## 2016-09-10 MED ORDER — LACTATED RINGERS IV SOLN
INTRAVENOUS | Status: DC
  Administered 2016-09-10: 09:00:00 via INTRAVENOUS

## 2016-09-10 MED ORDER — BUPIVACAINE HCL (PF) 0.25 % IJ SOLN
INTRAMUSCULAR | Status: DC | PRN
  Administered 2016-09-10: 3 mL

## 2016-09-10 MED ORDER — SODIUM CHLORIDE 0.9 % IR SOLN
Status: DC | PRN
  Administered 2016-09-10: 12:00:00

## 2016-09-10 MED ORDER — CEFAZOLIN SODIUM-DEXTROSE 2-4 GM/100ML-% IV SOLN
2.0000 g | INTRAVENOUS | Status: AC
  Administered 2016-09-10: 2 g via INTRAVENOUS
  Filled 2016-09-10: qty 100

## 2016-09-10 MED ORDER — SODIUM CHLORIDE 0.9% FLUSH: 3.0000 mL | INTRAVENOUS | Status: DC | PRN

## 2016-09-10 MED ORDER — ONDANSETRON HCL 4 MG/2ML IJ SOLN: 4.0000 mg | INTRAMUSCULAR | Status: DC | PRN

## 2016-09-10 MED ORDER — FENTANYL CITRATE (PF) 100 MCG/2ML IJ SOLN
25.0000 ug | INTRAMUSCULAR | Status: DC | PRN
  Administered 2016-09-10 (×3): 25 ug via INTRAVENOUS

## 2016-09-10 MED ORDER — CHLORHEXIDINE GLUCONATE CLOTH 2 % EX PADS: 6.0000 | MEDICATED_PAD | Freq: Once | CUTANEOUS | Status: DC

## 2016-09-10 MED ORDER — BUPIVACAINE HCL (PF) 0.25 % IJ SOLN
INTRAMUSCULAR | Status: AC
  Filled 2016-09-10: qty 30

## 2016-09-10 MED ORDER — PHENYLEPHRINE HCL 10 MG/ML IJ SOLN
INTRAMUSCULAR | Status: DC | PRN
  Administered 2016-09-10: 40 ug via INTRAVENOUS

## 2016-09-10 MED ORDER — HEMOSTATIC AGENTS (NO CHARGE) OPTIME
TOPICAL | Status: DC | PRN
  Administered 2016-09-10: 1 via TOPICAL

## 2016-09-10 MED ORDER — SUCCINYLCHOLINE CHLORIDE 20 MG/ML IJ SOLN
INTRAMUSCULAR | Status: DC | PRN
  Administered 2016-09-10: 100 mg via INTRAVENOUS

## 2016-09-10 MED ORDER — VANCOMYCIN HCL 1000 MG IV SOLR
INTRAVENOUS | Status: DC | PRN
  Administered 2016-09-10: 1000 mg

## 2016-09-10 MED ORDER — FENTANYL CITRATE (PF) 100 MCG/2ML IJ SOLN
INTRAMUSCULAR | Status: AC
  Filled 2016-09-10: qty 2

## 2016-09-10 MED ORDER — METOPROLOL SUCCINATE ER 25 MG PO TB24
50.0000 mg | ORAL_TABLET | Freq: Every day | ORAL | Status: DC
  Administered 2016-09-11 – 2016-09-13 (×3): 50 mg via ORAL
  Filled 2016-09-10 (×3): qty 2

## 2016-09-10 MED ORDER — LOSARTAN POTASSIUM 50 MG PO TABS
25.0000 mg | ORAL_TABLET | Freq: Every day | ORAL | Status: DC
  Administered 2016-09-10 – 2016-09-13 (×3): 25 mg via ORAL
  Filled 2016-09-10 (×3): qty 1

## 2016-09-10 MED ORDER — CELECOXIB 200 MG PO CAPS
200.0000 mg | ORAL_CAPSULE | Freq: Two times a day (BID) | ORAL | Status: DC
  Administered 2016-09-10 – 2016-09-11 (×3): 200 mg via ORAL
  Filled 2016-09-10 (×3): qty 1

## 2016-09-10 MED ORDER — LACTATED RINGERS IV SOLN
INTRAVENOUS | Status: DC | PRN
  Administered 2016-09-10 (×2): via INTRAVENOUS

## 2016-09-10 MED ORDER — GLYCOPYRROLATE 0.2 MG/ML IV SOSY
PREFILLED_SYRINGE | INTRAVENOUS | Status: DC | PRN
  Administered 2016-09-10: .3 mg via INTRAVENOUS

## 2016-09-10 MED ORDER — NEOSTIGMINE METHYLSULFATE 5 MG/5ML IV SOSY
PREFILLED_SYRINGE | INTRAVENOUS | Status: DC | PRN
  Administered 2016-09-10: 2 mg via INTRAVENOUS

## 2016-09-10 MED ORDER — ONDANSETRON HCL 4 MG/2ML IJ SOLN
INTRAMUSCULAR | Status: DC | PRN
  Administered 2016-09-10: 4 mg via INTRAVENOUS

## 2016-09-10 MED ORDER — PROPOFOL 10 MG/ML IV BOLUS
INTRAVENOUS | Status: DC | PRN
  Administered 2016-09-10: 20 mg via INTRAVENOUS
  Administered 2016-09-10: 110 mg via INTRAVENOUS

## 2016-09-10 MED ORDER — MOMETASONE FURO-FORMOTEROL FUM 200-5 MCG/ACT IN AERO
2.0000 | INHALATION_SPRAY | Freq: Two times a day (BID) | RESPIRATORY_TRACT | Status: DC
  Administered 2016-09-10 – 2016-09-13 (×6): 2 via RESPIRATORY_TRACT
  Filled 2016-09-10: qty 8.8

## 2016-09-10 MED ORDER — PHENOL 1.4 % MT LIQD: 1.0000 | OROMUCOSAL | Status: DC | PRN

## 2016-09-10 MED ORDER — MEPERIDINE HCL 25 MG/ML IJ SOLN: 6.2500 mg | INTRAMUSCULAR | Status: DC | PRN

## 2016-09-10 MED ORDER — SODIUM CHLORIDE 0.9 % IV SOLN: 250.0000 mL | INTRAVENOUS | Status: DC

## 2016-09-10 MED ORDER — 0.9 % SODIUM CHLORIDE (POUR BTL) OPTIME
TOPICAL | Status: DC | PRN
  Administered 2016-09-10: 1000 mL

## 2016-09-10 MED ORDER — ROCURONIUM BROMIDE 100 MG/10ML IV SOLN
INTRAVENOUS | Status: DC | PRN
  Administered 2016-09-10: 20 mg via INTRAVENOUS

## 2016-09-10 MED ORDER — MENTHOL 3 MG MT LOZG
1.0000 | LOZENGE | OROMUCOSAL | Status: DC | PRN
  Filled 2016-09-10: qty 9

## 2016-09-10 MED ORDER — OXYCODONE-ACETAMINOPHEN 5-325 MG PO TABS
1.0000 | ORAL_TABLET | ORAL | Status: DC | PRN
  Administered 2016-09-10 – 2016-09-11 (×2): 2 via ORAL
  Administered 2016-09-11 – 2016-09-12 (×2): 1 via ORAL
  Filled 2016-09-10: qty 1
  Filled 2016-09-10 (×2): qty 2
  Filled 2016-09-10: qty 1

## 2016-09-10 MED ORDER — MORPHINE SULFATE (PF) 2 MG/ML IV SOLN
1.0000 mg | INTRAVENOUS | Status: DC | PRN
  Administered 2016-09-10: 1 mg via INTRAVENOUS
  Filled 2016-09-10: qty 1

## 2016-09-10 MED ORDER — FERROUS SULFATE 325 (65 FE) MG PO TABS
325.0000 mg | ORAL_TABLET | Freq: Every day | ORAL | Status: DC
  Administered 2016-09-10 – 2016-09-13 (×4): 325 mg via ORAL
  Filled 2016-09-10 (×4): qty 1

## 2016-09-10 MED ORDER — THROMBIN 20000 UNITS EX SOLR
CUTANEOUS | Status: DC | PRN
  Administered 2016-09-10: 12:00:00 via TOPICAL

## 2016-09-10 MED ORDER — LIDOCAINE HCL (CARDIAC) 20 MG/ML IV SOLN
INTRAVENOUS | Status: DC | PRN
  Administered 2016-09-10: 60 mg via INTRATRACHEAL

## 2016-09-10 MED ORDER — THROMBIN 5000 UNITS EX SOLR
OROMUCOSAL | Status: DC | PRN
  Administered 2016-09-10: 12:00:00 via TOPICAL

## 2016-09-10 MED ORDER — POTASSIUM CHLORIDE IN NACL 20-0.9 MEQ/L-% IV SOLN
INTRAVENOUS | Status: DC
  Administered 2016-09-10: 19:00:00 via INTRAVENOUS
  Filled 2016-09-10 (×2): qty 1000

## 2016-09-10 MED ORDER — ACETAMINOPHEN 650 MG RE SUPP: 650.0000 mg | RECTAL | Status: DC | PRN

## 2016-09-10 MED ORDER — FENTANYL CITRATE (PF) 100 MCG/2ML IJ SOLN
INTRAMUSCULAR | Status: AC
  Filled 2016-09-10: qty 4

## 2016-09-10 MED ORDER — ACETAMINOPHEN 325 MG PO TABS: 650.0000 mg | ORAL_TABLET | ORAL | Status: DC | PRN

## 2016-09-10 SURGICAL SUPPLY — 65 items
ADH SKN CLS APL DERMABOND .7 (GAUZE/BANDAGES/DRESSINGS) ×1
APL SKNCLS STERI-STRIP NONHPOA (GAUZE/BANDAGES/DRESSINGS) ×1
BAG DECANTER FOR FLEXI CONT (MISCELLANEOUS) ×2 IMPLANT
BASKET BONE COLLECTION (BASKET) ×2 IMPLANT
BENZOIN TINCTURE PRP APPL 2/3 (GAUZE/BANDAGES/DRESSINGS) ×2 IMPLANT
BIT DRILL PLIF MAS 5.0MM DISP (DRILL) ×1 IMPLANT
BLADE CLIPPER SURG (BLADE) IMPLANT
BONE CANC CHIPS 20CC PCAN1/4 (Bone Implant) ×2 IMPLANT
BONE MATRIX OSTEOCEL PRO SM (Bone Implant) ×4 IMPLANT
BUR MATCHSTICK NEURO 3.0 LAGG (BURR) ×2 IMPLANT
CANISTER SUCT 3000ML PPV (MISCELLANEOUS) ×2 IMPLANT
CAP RELINE MOD TULIP RMM (Cap) ×8 IMPLANT
CARTRIDGE OIL MAESTRO DRILL (MISCELLANEOUS) ×1 IMPLANT
CHIPS CANC BONE 20CC PCAN1/4 (Bone Implant) ×1 IMPLANT
CLIP NEUROVISION LG (CLIP) ×2 IMPLANT
CONT SPEC 4OZ CLIKSEAL STRL BL (MISCELLANEOUS) ×2 IMPLANT
COVER BACK TABLE 60X90IN (DRAPES) ×2 IMPLANT
DERMABOND ADVANCED (GAUZE/BANDAGES/DRESSINGS) ×1
DERMABOND ADVANCED .7 DNX12 (GAUZE/BANDAGES/DRESSINGS) ×1 IMPLANT
DIFFUSER DRILL AIR PNEUMATIC (MISCELLANEOUS) ×2 IMPLANT
DRAPE C-ARM 42X72 X-RAY (DRAPES) IMPLANT
DRAPE C-ARMOR (DRAPES) ×2 IMPLANT
DRAPE LAPAROTOMY 100X72X124 (DRAPES) ×2 IMPLANT
DRAPE POUCH INSTRU U-SHP 10X18 (DRAPES) ×2 IMPLANT
DRAPE SURG 17X23 STRL (DRAPES) ×2 IMPLANT
DRILL PLIF MAS 5.0MM DISP (DRILL) ×2
DRSG OPSITE POSTOP 4X6 (GAUZE/BANDAGES/DRESSINGS) ×2 IMPLANT
DURAPREP 26ML APPLICATOR (WOUND CARE) ×2 IMPLANT
ELECT BLADE 4.0 EZ CLEAN MEGAD (MISCELLANEOUS) ×2
ELECT REM PT RETURN 9FT ADLT (ELECTROSURGICAL) ×2
ELECTRODE BLDE 4.0 EZ CLN MEGD (MISCELLANEOUS) ×1 IMPLANT
ELECTRODE REM PT RTRN 9FT ADLT (ELECTROSURGICAL) ×1 IMPLANT
EVACUATOR 1/8 PVC DRAIN (DRAIN) IMPLANT
GAUZE SPONGE 4X4 16PLY XRAY LF (GAUZE/BANDAGES/DRESSINGS) IMPLANT
GLOVE BIO SURGEON STRL SZ8 (GLOVE) ×4 IMPLANT
GOWN STRL REUS W/ TWL LRG LVL3 (GOWN DISPOSABLE) IMPLANT
GOWN STRL REUS W/ TWL XL LVL3 (GOWN DISPOSABLE) ×2 IMPLANT
GOWN STRL REUS W/TWL 2XL LVL3 (GOWN DISPOSABLE) IMPLANT
GOWN STRL REUS W/TWL LRG LVL3 (GOWN DISPOSABLE)
GOWN STRL REUS W/TWL XL LVL3 (GOWN DISPOSABLE) ×4
HEMOSTAT POWDER KIT SURGIFOAM (HEMOSTASIS) ×2 IMPLANT
KIT BASIN OR (CUSTOM PROCEDURE TRAY) ×2 IMPLANT
KIT ROOM TURNOVER OR (KITS) ×2 IMPLANT
MODULE NVM5 NEXT GEN EMG (NEEDLE) ×2 IMPLANT
NEEDLE HYPO 25X1 1.5 SAFETY (NEEDLE) ×2 IMPLANT
NS IRRIG 1000ML POUR BTL (IV SOLUTION) ×2 IMPLANT
OIL CARTRIDGE MAESTRO DRILL (MISCELLANEOUS) ×2
PACK LAMINECTOMY NEURO (CUSTOM PROCEDURE TRAY) ×2 IMPLANT
PAD ARMBOARD 7.5X6 YLW CONV (MISCELLANEOUS) ×6 IMPLANT
ROD RELINE COCR LORD 5X40MM (Rod) ×4 IMPLANT
SCREW LOCK RSS 4.5/5.0MM (Screw) ×8 IMPLANT
SCREW SHANK RELINE MOD 5.5X35 (Screw) ×4 IMPLANT
SEALANT ADHERUS EXTEND TIP (MISCELLANEOUS) ×2 IMPLANT
SHANK RELINE MOD 5.5X40 (Screw) ×4 IMPLANT
SPONGE LAP 4X18 X RAY DECT (DISPOSABLE) IMPLANT
SPONGE SURGIFOAM ABS GEL 100 (HEMOSTASIS) ×2 IMPLANT
STRIP CLOSURE SKIN 1/2X4 (GAUZE/BANDAGES/DRESSINGS) ×2 IMPLANT
SUT VIC AB 0 CT1 18XCR BRD8 (SUTURE) ×1 IMPLANT
SUT VIC AB 0 CT1 8-18 (SUTURE) ×2
SUT VIC AB 2-0 CP2 18 (SUTURE) ×2 IMPLANT
SUT VIC AB 3-0 SH 8-18 (SUTURE) ×4 IMPLANT
TOWEL OR 17X24 6PK STRL BLUE (TOWEL DISPOSABLE) ×2 IMPLANT
TOWEL OR 17X26 10 PK STRL BLUE (TOWEL DISPOSABLE) ×2 IMPLANT
TRAY FOLEY W/METER SILVER 16FR (SET/KITS/TRAYS/PACK) ×2 IMPLANT
WATER STERILE IRR 1000ML POUR (IV SOLUTION) ×2 IMPLANT

## 2016-09-10 NOTE — Anesthesia Postprocedure Evaluation (Signed)
Anesthesia Post Note  Patient: Alison [name]  Procedure(s) Performed: Procedure(s) (LRB): Posterior Lateral Fusion - Lumbar four-Lumbar five, lumbar laminectomy and pedicle screw fixation  - Lumbar four-Lumbar five (N/A)  Patient location during evaluation: PACU Anesthesia Type: General Level of consciousness: awake and alert Pain management: pain level controlled Vital Signs Assessment: post-procedure vital signs reviewed and stable Respiratory status: spontaneous breathing, nonlabored ventilation, respiratory function stable and patient connected to nasal cannula oxygen Cardiovascular status: blood pressure returned to baseline and stable Postop Assessment: no signs of nausea or vomiting Anesthetic complications: no    Last Vitals:  Vitals:   09/10/16 1535 09/10/16 1554  BP:  (!) 145/79  Pulse:  (!) 58  Resp:  15  Temp: 36.1 C 36.3 C    Last Pain:  Vitals:   09/10/16 1554  TempSrc: Oral  PainSc:                  Effie Berkshire

## 2016-09-10 NOTE — Transfer of Care (Signed)
Immediate Anesthesia Transfer of Care Note  Patient: Alison [name]  Procedure(s) Performed: Procedure(s): Posterior Lateral Fusion - Lumbar four-Lumbar five, lumbar laminectomy and pedicle screw fixation  - Lumbar four-Lumbar five (N/A)  Patient Location: PACU  Anesthesia Type:General  Level of Consciousness: sedated  Airway & Oxygen Therapy: Patient Spontanous Breathing and Patient connected to face mask oxygen  Post-op Assessment: Report given to RN and Post -op Vital signs reviewed and stable  Post vital signs: Reviewed and stable  Last Vitals:  Vitals:   09/10/16 0826  BP: (!) 166/83  Pulse: 78  Resp: 18  Temp: 36.9 C    Last Pain:  Vitals:   09/10/16 0834  TempSrc:   PainSc: 8       Patients Stated Pain Goal: 4 (43/53/91 2258)  Complications: No apparent anesthesia complications

## 2016-09-10 NOTE — Op Note (Signed)
09/10/2016  1:19 PM  PATIENT:  Alison [name]  82 y.o. female  PRE-OPERATIVE DIAGNOSIS:  Lumbar spinal stenosis L4-5 with grade 1 spondylolisthesis, back and leg pain  POST-OPERATIVE DIAGNOSIS:  same  PROCEDURE:   1. Decompressive lumbar laminectomy L4-5 to address the spinal stenosis 2. Posterior fixation L4-5 using cortical pedicle screws.  3. Intertransverse arthrodesis L4-5 bilaterally using morcellized autograft and allograft.  SURGEON:  Sherley Bounds, MD  ASSISTANTS: Dr. Cyndy Freeze  ANESTHESIA:  General  EBL: 100 ml  Total I/O In: 1000 [I.V.:1000] Out: 250 [Urine:250]  BLOOD ADMINISTERED:none  DRAINS: None  INDICATION FOR PROCEDURE: Patient with back and bilateral leg pain. She had weakness in her legs became wheelchair bound. MRI showed significant spinal stenosis L4-5 with a grade 1 spondylolisthesis. She tried medical management. I recommended decompression attachment fusion. Patient understood the risks, benefits, and alternatives and potential outcomes and wished to proceed.  PROCEDURE DETAILS:  The patient was brought to the operating room. After induction of generalized endotracheal anesthesia the patient was rolled into the prone position on chest rolls and all pressure points were padded. The patient's lumbar region was cleaned and then prepped with DuraPrep and draped in the usual sterile fashion. Anesthesia was injected and then a dorsal midline incision was made and carried down to the lumbosacral fascia. The fascia was opened and the paraspinous musculature was taken down in a subperiosteal fashion to expose L4-5. A self-retaining retractor was placed. Intraoperative fluoroscopy confirmed my level, and I started with placement of the L4 and L5 cortical pedicle screws. The pedicle screw entry zones were identified utilizing surface landmarks and  AP and lateral fluoroscopy. I scored the cortex with the high-speed drill and then used the hand drill and EMG monitoring  to drill an upward and outward direction into the pedicle. I then tapped line to line, and the tap was also monitored. I then placed a 5.5 x 40 mm cortical pedicle screw into the pedicles of L4 bilaterally and 5.5 x 35 mm cortical pedicle screws at L5. I then turned my attention to the decompression and complete lumbar laminectomies, hemi- facetectomies, and foraminotomies were performed at L4-5 bilaterally. The patient had significant spinal stenosis. Much more generous decompression was undertaken in order to adequately decompress the neural elements and address the patient's leg pain. The yellow ligament was removed to expose the underlying dura and nerve roots, and generous foraminotomies were performed to adequately decompress the neural elements. Both the exiting and traversing nerve roots were decompressed on both sides until a coronary dilator passed easily along the nerve roots.  We then decorticated the transverse processes and laid a mixture of morcellized autograft and allograft out over these to perform intertransverse arthrodesis at L4-5. We then placed lordotic rods into the multiaxial screw heads of the pedicle screws and locked these in position with the locking caps and anti-torque device. We then checked our construct with AP and lateral fluoroscopy. Irrigated with copious amounts of bacitracin-containing saline solution. Inspected the nerve roots once again to assure adequate decompression, lined to the dura with Gelfoam, and closed the muscle and the fascia with 0 Vicryl. Closed the subcutaneous tissues with 2-0 Vicryl and subcuticular tissues with 3-0 Vicryl. The skin was closed with benzoin and Steri-Strips. Dressing was then applied, the patient was awakened from general anesthesia and transported to the recovery room in stable condition. At the end of the procedure all sponge, needle and instrument counts were correct.   PLAN OF CARE: Admit to  inpatient   PATIENT DISPOSITION:  PACU -  hemodynamically stable.   Delay start of Pharmacological VTE agent (>24hrs) due to surgical blood loss or risk of bleeding:  yes

## 2016-09-10 NOTE — Progress Notes (Signed)
Pt arrived to 5C18 via stretcher.  Pt alert and oriented, in minimal pain.  VSS.  Will Continue to monitor.  Cori Razor, RN

## 2016-09-10 NOTE — Anesthesia Preprocedure Evaluation (Addendum)
Anesthesia Evaluation  Patient identified by MRN, date of birth, ID band Patient awake    Reviewed: Allergy & Precautions, NPO status , Patient's Chart, lab work & pertinent test results  Airway Mallampati: II  TM Distance: >3 FB Neck ROM: Full    Dental  (+) Edentulous Upper, Edentulous Lower   Pulmonary COPD,    breath sounds clear to auscultation       Cardiovascular hypertension, Pt. on medications and Pt. on home beta blockers + CAD, + Past MI and +CHF   Rhythm:Regular Rate:Normal     Neuro/Psych negative neurological ROS  negative psych ROS   GI/Hepatic negative GI ROS, Neg liver ROS,   Endo/Other  negative endocrine ROS  Renal/GU negative Renal ROS  negative genitourinary   Musculoskeletal  (+) Arthritis , Osteoarthritis,    Abdominal   Peds negative pediatric ROS (+)  Hematology negative hematology ROS (+)   Anesthesia Other Findings   Reproductive/Obstetrics negative OB ROS                            Lab Results  Component Value Date   WBC 11.2 (H) 09/08/2016   HGB 9.6 (L) 09/08/2016   HCT 30.6 (L) 09/08/2016   MCV 91.6 09/08/2016   PLT 200 09/08/2016   Lab Results  Component Value Date   CREATININE 1.46 (H) 09/08/2016   BUN 28 (H) 09/08/2016   NA 141 09/08/2016   K 4.2 09/08/2016   CL 108 09/08/2016   CO2 23 09/08/2016   Lab Results  Component Value Date   INR 1.11 09/08/2016   INR 3.01 (H) 03/06/2010   INR 1.69 (H) 03/05/2010   08/2016 EKG: normal sinus rhythm.  Anesthesia Physical Anesthesia Plan  ASA: III  Anesthesia Plan: General   Post-op Pain Management:    Induction: Intravenous  Airway Management Planned: Oral ETT  Additional Equipment:   Intra-op Plan:   Post-operative Plan: Extubation in OR  Informed Consent: I have reviewed the patients History and Physical, chart, labs and discussed the procedure including the risks, benefits and  alternatives for the proposed anesthesia with the patient or authorized representative who has indicated his/her understanding and acceptance.   Dental advisory given  Plan Discussed with: CRNA  Anesthesia Plan Comments:         Anesthesia Quick Evaluation

## 2016-09-10 NOTE — H&P (Signed)
Subjective: Patient is a 82 y.o. female admitted for stenosis. Onset of symptoms was several months ago, gradually worsening since that time.  The pain is rated severe, and is located at the across the lower back and radiates to lrgs. The pain is described as aching and occurs all day. The symptoms have been progressive. Symptoms are exacerbated by nothing in particular. MRI or CT showed stenosis/ spondy L4-5   Past Medical History:  Diagnosis Date  . CHF (congestive heart failure) ([place])    ?  in ed 08/23/16  . Chronic anemia    takes Iron pill daily  . Chronic back pain    stenosis/spondylolisthesis  . COPD (chronic obstructive pulmonary disease) ([place])   . Coronary atherosclerosis of native coronary artery    Occluded circ with collaterals and 90% RCA (previous stents at [facility], details not clear), LVEF 48% 1/11 - inferior and inferolateral scar by Myoview 1/11,   . Gout    takes Allopurinol daily  . History of blood clots seeral yrs ago   abdomen  . History of blood transfusion    no abnormal reaction noted  . Hyperlipidemia    takes Crestor daily  . Hypertension    takes Losartan and Metoprolol daily  . Myocardial infarction 1997  . Nocturia   . Osteoarthritis   . Peripheral edema    takes Lasix daily  . Pneumonia    hx of--f20+ yrs ago  . Pulmonary embolism ([facility])    History of recurrent DVT and PE status post IVC filter, previously on Coumadin    Past Surgical History:  Procedure Laterality Date  . ABDOMINAL HYSTERECTOMY  1965   partial  . APPENDECTOMY    . BACK SURGERY    . cataract surgery Bilateral   . CORONARY ANGIOPLASTY  1997   3 stents  . cyst removed from back  2006  . Left adrenalectomy  2009  . Right breast biopsy  2008  . Right knee replacement  2011    Prior to Admission medications   Medication Sig Start Date End Date Taking? Authorizing Provider  acetaminophen (TYLENOL) 650 MG CR tablet Take 650 mg by mouth 2 (two) times daily.    Yes Historical  Provider, MD  ADVAIR DISKUS 250-50 MCG/DOSE AEPB Inhale 1 Inhaler into the lungs 2 (two) times daily.  09/10/12  Yes Historical Provider, MD  allopurinol (ZYLOPRIM) 100 MG tablet Take 100 mg by mouth daily.   Yes Historical Provider, MD  Cholecalciferol (VITAMIN D3) 1000 units CAPS Take 1,000 Units by mouth daily.    Yes Historical Provider, MD  clopidogrel (PLAVIX) 75 MG tablet TAKE 1 TABLET BY MOUTH EVERY DAY 01/26/16  Yes Satira Sark, MD  CRESTOR 10 MG tablet TAKE 1 TABLET EVERY DAY Patient taking differently: TAKE 1 TABLET EVERY DAY IN THE MORNING 01/26/16  Yes Satira Sark, MD  ferrous sulfate 325 (65 FE) MG tablet Take 325 mg by mouth daily.    Yes Historical Provider, MD  furosemide (LASIX) 40 MG tablet Take 1 tablet (40 mg total) by mouth daily. Take 1 tablet twice a day for Nov 13, 14, 15 then take one daily. Patient taking differently: Take 40 mg by mouth daily.  08/22/16  Yes Merrily Pew, MD  losartan (COZAAR) 25 MG tablet TAKE 1 TABLET BY MOUTH EVERY DAY 10/29/15  Yes Satira Sark, MD  metoprolol succinate (TOPROL-XL) 50 MG 24 hr tablet TAKE 1 TABLET EVERY DAY WITH OR IMMEDIATELY FOLLOWING A MEAL 01/26/16  Yes Satira Sark, MD  nitroGLYCERIN (NITROSTAT) 0.4 MG SL tablet Place 0.4 mg under the tongue every 5 (five) minutes as needed for chest pain.     Historical Provider, MD   No Known Allergies  Social History  Substance Use Topics  . Smoking status: Never Smoker  . Smokeless tobacco: Never Used  . Alcohol use No    Family History  Problem Relation Age of Onset  . Stroke Father      Review of Systems  Positive ROS: neg  All other systems have been reviewed and were otherwise negative with the exception of those mentioned in the HPI and as above.  Objective: Vital signs in last 24 hours: Temp:  [98.5 F (36.9 C)] 98.5 F (36.9 C) (12/01 0826) Pulse Rate:  [78] 78 (12/01 0826) Resp:  [18] 18 (12/01 0826) BP: (166)/(83) 166/83 (12/01 0826) SpO2:  [99  %] 99 % (12/01 0826) Weight:  [100.2 kg (221 lb)] 100.2 kg (221 lb) (12/01 0826)  General Appearance: Alert, cooperative, no distress, appears stated age Head: Normocephalic, without obvious abnormality, atraumatic Eyes: PERRL, conjunctiva/corneas clear, EOM's intact    Neck: Supple, symmetrical, trachea midline Back: Symmetric, no curvature, ROM normal, no CVA tenderness Lungs:  respirations unlabored Heart: Regular rate and rhythm Abdomen: Soft, non-tender Extremities: Extremities normal, atraumatic, no cyanosis or edema Pulses: 2+ and symmetric all extremities Skin: Skin color, texture, turgor normal, no rashes or lesions  NEUROLOGIC:   Mental status: Alert and oriented x4,  no aphasia, good attention span, fund of knowledge, and memory Motor Exam - grossly normal Sensory Exam - grossly normal Reflexes: absent Coordination - grossly normal Gait - grossly normal Balance - grossly normal Cranial Nerves: I: smell Not tested  II: visual acuity  OS: nl    OD: nl  II: visual fields Full to confrontation  II: pupils Equal, round, reactive to light  III,VII: ptosis None  III,IV,VI: extraocular muscles  Full ROM  V: mastication Normal  V: facial light touch sensation  Normal  V,VII: corneal reflex  Present  VII: facial muscle function - upper  Normal  VII: facial muscle function - lower Normal  VIII: hearing Not tested  IX: soft palate elevation  Normal  IX,X: gag reflex Present  XI: trapezius strength  5/5  XI: sternocleidomastoid strength 5/5  XI: neck flexion strength  5/5  XII: tongue strength  Normal    Data Review Lab Results  Component Value Date   WBC 11.2 (H) 09/08/2016   HGB 9.6 (L) 09/08/2016   HCT 30.6 (L) 09/08/2016   MCV 91.6 09/08/2016   PLT 200 09/08/2016   Lab Results  Component Value Date   NA 141 09/08/2016   K 4.2 09/08/2016   CL 108 09/08/2016   CO2 23 09/08/2016   BUN 28 (H) 09/08/2016   CREATININE 1.46 (H) 09/08/2016   GLUCOSE 93  09/08/2016   Lab Results  Component Value Date   INR 1.11 09/08/2016    Assessment/Plan: Patient admitted for decompression/ fusion L4-5. Patient has failed a reasonable attempt at conservative therapy.  I explained the condition and procedure to the patient and answered any questions.  Patient wishes to proceed with procedure as planned. Understands risks/ benefits and typical outcomes of procedure.   Lolita Faulds S 09/10/2016 10:12 AM

## 2016-09-10 NOTE — Anesthesia Procedure Notes (Signed)
Procedure Name: Intubation Date/Time: 09/10/2016 11:04 AM Performed by: Maude Leriche D Pre-anesthesia Checklist: Patient identified, Emergency Drugs available, Suction available, Patient being monitored and Timeout performed Patient Re-evaluated:Patient Re-evaluated prior to inductionOxygen Delivery Method: Circle system utilized Preoxygenation: Pre-oxygenation with 100% oxygen Intubation Type: IV induction Ventilation: Mask ventilation without difficulty Laryngoscope Size: Miller and 2 Grade View: Grade I Tube type: Oral Tube size: 7.5 mm Number of attempts: 1 Airway Equipment and Method: Stylet Placement Confirmation: ETT inserted through vocal cords under direct vision,  positive ETCO2 and breath sounds checked- equal and bilateral Secured at: 21 cm Tube secured with: Tape Dental Injury: Teeth and Oropharynx as per pre-operative assessment

## 2016-09-11 MED ORDER — INFLUENZA VAC SPLIT QUAD 0.5 ML IM SUSY
0.5000 mL | PREFILLED_SYRINGE | INTRAMUSCULAR | Status: AC
  Administered 2016-09-12: 0.5 mL via INTRAMUSCULAR
  Filled 2016-09-11 (×2): qty 0.5

## 2016-09-11 MED ORDER — SODIUM CHLORIDE 0.9 % IV BOLUS (SEPSIS)
500.0000 mL | Freq: Once | INTRAVENOUS | Status: AC
  Administered 2016-09-11: 500 mL via INTRAVENOUS

## 2016-09-11 NOTE — Progress Notes (Signed)
Patient with no void since foley d/c'd this AM, MD made aware by day shift RN. Patient with several attempts to void without success, per report, bladder scan volume 0-2ml.  At beginning of shift confirmed with on call MD to finish fluid bolus and then I&O cath patient. Patient at this time remains asymptomatic, no sensation to void; patient with no complaints of pain.  Continue to monitor.

## 2016-09-11 NOTE — Progress Notes (Signed)
Patient had foley cath. Removed at 0645 am today; denies the need to void; bladder scan reveals 51ml residual urine; patient is going to attempt the bedside commode; will monitor for in and out cath. If necessary.; encouraged po fluids.

## 2016-09-11 NOTE — Evaluation (Signed)
Physical Therapy Evaluation Patient Details Name: Alison [name] MRN: [id number] DOB: June 25, 1931 Today's Date: 09/11/2016   History of Present Illness  Pt is an 82 y/o female s/p L4-L5 decompression/fusion. PMH including but not limited to CHF, COPD, HTN and hx of MI in 1997.  Clinical Impression  Pt presented sitting OOB in recliner when PT entered room. Prior to admission, pt reported being mod I with use of RW to ambulate. Pt currently requires mod A for bed mobility and min guard for safety during ambulation with use of RW. Pt would continue to benefit from skilled physical therapy services at this time while admitted and after d/c to address her below listed limitations in order to improve his overall safety and independence with functional mobility.      Follow Up Recommendations SNF    Equipment Recommendations  None recommended by PT    Recommendations for Other Services       Precautions / Restrictions Precautions Precautions: Back;Fall Precaution Booklet Issued: No Precaution Comments: OT provided handout. PT verbally reviewed 3/3 back precautions with pt. Required Braces or Orthoses: Spinal Brace Spinal Brace: Lumbar corset Restrictions Weight Bearing Restrictions: No      Mobility  Bed Mobility Overal bed mobility: Needs Assistance Bed Mobility: Rolling;Sit to Sidelying Rolling: Min guard       Sit to sidelying: Mod assist General bed mobility comments: assist for bilateral LEs back onto bed  Transfers Overall transfer level: Needs assistance Equipment used: Rolling walker (2 wheeled) Transfers: Sit to/from Stand Sit to Stand: Min guard         General transfer comment: pt required increased time, good hand placement, min guard for safety to stand from recliner  Ambulation/Gait Ambulation/Gait assistance: Min guard Ambulation Distance (Feet): 50 Feet Assistive device: Rolling walker (2 wheeled) Gait Pattern/deviations: Step-through  pattern;Decreased stride length Gait velocity: decreased   General Gait Details: pt demonstrated safety with use of RW  Stairs            Wheelchair Mobility    Modified Rankin (Stroke Patients Only)       Balance Overall balance assessment: Needs assistance Sitting-balance support: Feet supported;No upper extremity supported Sitting balance-Leahy Scale: Good     Standing balance support: During functional activity;Bilateral upper extremity supported Standing balance-Leahy Scale: Poor Standing balance comment: pt reliant on bilateral UEs on RW                             Pertinent Vitals/Pain Pain Assessment: No/denies pain    Home Living Family/patient expects to be discharged to:: Skilled nursing facility                      Prior Function Level of Independence: Independent with assistive device(s)         Comments: used RW in home. w/c community. Not very active due to back.      Hand Dominance   Dominant Hand: Right    Extremity/Trunk Assessment   Upper Extremity Assessment: Defer to OT evaluation           Lower Extremity Assessment: Overall WFL for tasks assessed      Cervical / Trunk Assessment: Kyphotic;Other exceptions  Communication   Communication: No difficulties  Cognition Arousal/Alertness: Awake/alert Behavior During Therapy: WFL for tasks assessed/performed Overall Cognitive Status: Within Functional Limits for tasks assessed  General Comments      Exercises     Assessment/Plan    PT Assessment Patient needs continued PT services  PT Problem List Decreased activity tolerance;Decreased balance;Decreased mobility;Decreased coordination;Decreased range of motion          PT Treatment Interventions DME instruction;Gait training;Functional mobility training;Therapeutic activities;Therapeutic exercise;Balance training;Neuromuscular re-education;Patient/family education     PT Goals (Current goals can be found in the Care Plan section)  Acute Rehab PT Goals Patient Stated Goal: to be independent and walk PT Goal Formulation: With patient Time For Goal Achievement: 09/25/16 Potential to Achieve Goals: Fair    Frequency Min 5X/week   Barriers to discharge        Co-evaluation               End of Session Equipment Utilized During Treatment: Gait belt;Back brace Activity Tolerance: Patient tolerated treatment well Patient left: in bed;with call bell/phone within reach;with bed alarm set Nurse Communication: Mobility status         Time: 1635-1650 PT Time Calculation (min) (ACUTE ONLY): 15 min   Charges:   PT Evaluation $PT Eval Moderate Complexity: 1 Procedure     PT G CodesClearnce Sorrel Amrom Ore 09/11/2016, 5:00 PM left

## 2016-09-11 NOTE — Progress Notes (Signed)
Occupational Therapy Evaluation Patient Details Name: Alison [name] MRN: [id number] DOB: 09/09/1931 Today's Date: 09/11/2016    History of Present Illness 82 yo s/p decompression/ fusion L4-5.   Clinical Impression   PTA, pt modified independent with limited distance @ RW level and used w/c for community.  Pt lives with son, but she would be alone during the day.Pt currently requires min A for mobility and Mod A with ADL. Pt will benefit from rehab at SNF to return to modified independent level. Pt agreeable to SNF for rehab. Will follow acutely to address established goals.  Follow Up Recommendations  SNF;Supervision/Assistance - 24 hour    Equipment Recommendations  Other (comment) (TBA at SNF)    Recommendations for Other Services       Precautions / Restrictions Precautions Precautions: Back;Fall Precaution Booklet Issued: Yes (comment) Required Braces or Orthoses: Spinal Brace Spinal Brace: Lumbar corset      Mobility Bed Mobility Overal bed mobility: Needs Assistance Bed Mobility: Sidelying to Sit;Sit to Sidelying   Sidelying to sit: Min assist     Sit to sidelying: Min assist General bed mobility comments: assist to help lift legs back to bed  Transfers Overall transfer level: Needs assistance Equipment used: Rolling walker (2 wheeled) Transfers: Sit to/from [facility] Sit to Stand: Min assist         General transfer comment: mod vc for upright posture. Pt apparently has habit of bending far forward to stand up.    Balance Overall balance assessment: Needs assistance   Sitting balance-Leahy Scale: Good       Standing balance-Leahy Scale: Fair                              ADL Overall ADL's : Needs assistance/impaired     Grooming: Set up;Supervision/safety   Upper Body Bathing: Set up;Supervision/ safety;Sitting   Lower Body Bathing: Moderate assistance;Cueing for safety;Cueing for back precautions    Upper Body Dressing : Set up;Supervision/safety;Sitting   Lower Body Dressing: Moderate assistance;Sit to/from stand   Toilet Transfer: Minimal assistance;Ambulation   Toileting- Clothing Manipulation and Hygiene: Moderate assistance       Functional mobility during ADLs: Minimal assistance;Rolling walker;Cueing for safety (vc for upright posture) General ADL Comments: Unable to cross feet over knees. Will need to use AE. Began education on back precuations and ADL     Vision Additional Comments: states vision is "not good"   Perception     Praxis      Pertinent Vitals/Pain Pain Assessment: Faces Faces Pain Scale: Hurts a little bit Pain Location: back Pain Descriptors / Indicators: Discomfort Pain Intervention(s): Limited activity within patient's tolerance;Repositioned     Hand Dominance Right   Extremity/Trunk Assessment Upper Extremity Assessment Upper Extremity Assessment: Overall WFL for tasks assessed   Lower Extremity Assessment Lower Extremity Assessment: Defer to PT evaluation   Cervical / Trunk Assessment Cervical / Trunk Assessment: Kyphotic   Communication Communication Communication: HOH   Cognition Arousal/Alertness: Awake/alert Behavior During Therapy: WFL for tasks assessed/performed Overall Cognitive Status: Within Functional Limits for tasks assessed                     General Comments       Exercises       Shoulder Instructions      Home Living Family/patient expects to be discharged to:: Skilled nursing facility  Prior Functioning/Environment Level of Independence: Independent with assistive device(s)        Comments: used RW in home. w/c community. Not very active due to back. Could do her ADL but it was difficult        OT Problem List: Decreased strength;Decreased range of motion;Decreased activity tolerance;Impaired balance (sitting and/or  standing);Decreased safety awareness;Decreased knowledge of use of DME or AE;Decreased knowledge of precautions;Obesity;Pain   OT Treatment/Interventions: Self-care/ADL training;DME and/or AE instruction;Therapeutic activities;Patient/family education;Balance training    OT Goals(Current goals can be found in the care plan section) Acute Rehab OT Goals Patient Stated Goal: to be independent and walk OT Goal Formulation: With patient Time For Goal Achievement: 09/25/16 Potential to Achieve Goals: Good ADL Goals Pt Will Perform Lower Body Bathing: with set-up;with supervision;with adaptive equipment;sit to/from stand Pt Will Perform Lower Body Dressing: with set-up;with supervision;with adaptive equipment;sit to/from stand Pt Will Transfer to Toilet: with modified independence;ambulating;bedside commode Pt Will Perform Toileting - Clothing Manipulation and hygiene: with set-up;with supervision;sitting/lateral leans;sit to/from stand;with adaptive equipment Additional ADL Goal #1: Pt will independnetly verbalize 3/3 back precuations for ADL  OT Frequency: Min 2X/week   Barriers to D/C: Decreased caregiver support  Family works and pt would be alone during the day       Co-evaluation              End of Session Equipment Utilized During Treatment: Gait belt;Rolling walker;Back brace Nurse Communication: Mobility status  Activity Tolerance: Patient tolerated treatment well Patient left: in bed;with call bell/phone within reach;with bed alarm set   Time: 1125-1150 OT Time Calculation (min): 25 min Charges:  OT General Charges $OT Visit: 1 Procedure OT Evaluation $OT Eval Moderate Complexity: 1 Procedure OT Treatments $Self Care/Home Management : 8-22 mins G-Codes:    Oriel Ojo,HILLARY 2016-09-24, 12:07 PM  [place], OTR/L  [phone] 09/24/2016

## 2016-09-11 NOTE — Progress Notes (Signed)
Patient foley d/c'd per order. Patient OOB with brace, ambulated in hallway with RW and one assist approximately 30 yards. No complaints of pain, dizziness, etc. Report given to dayshift RN.

## 2016-09-11 NOTE — Progress Notes (Signed)
No sensation to void;  Patient has attempted; MD notified; orders received for fluid bolus; continue to monitor for in and out cath prn.; will repeat bladder scan.

## 2016-09-11 NOTE — Progress Notes (Signed)
Patient ID: Alison [name], female   DOB: 03/24/1931, 82 y.o.   MRN: [id number] Subjective: Patient reports no headache, no leg pain, no numbness tingling weakness, ambulated with a nurse, eating well, no back soreness  Objective: Vital signs in last 24 hours: Temp:  [97 F (36.1 C)-98.5 F (36.9 C)] 97.8 F (36.6 C) (12/02 0615) Pulse Rate:  [56-90] 66 (12/02 0615) Resp:  [10-25] 16 (12/02 0615) BP: (106-169)/(50-94) 106/50 (12/02 0615) SpO2:  [94 %-100 %] 95 % (12/02 0615) Weight:  [100.2 kg (221 lb)] 100.2 kg (221 lb) (12/01 0826)  Intake/Output from previous day: 12/01 0701 - 12/02 0700 In: 2102.5 [I.V.:2002.5; IV Piggyback:100] Out: 825 [Urine:775; Blood:50] Intake/Output this shift: No intake/output data recorded.  Neurologic: Grossly normal  Lab Results: Lab Results  Component Value Date   WBC 11.2 (H) 09/08/2016   HGB 9.6 (L) 09/08/2016   HCT 30.6 (L) 09/08/2016   MCV 91.6 09/08/2016   PLT 200 09/08/2016   Lab Results  Component Value Date   INR 1.11 09/08/2016   BMET Lab Results  Component Value Date   NA 141 09/08/2016   K 4.2 09/08/2016   CL 108 09/08/2016   CO2 23 09/08/2016   GLUCOSE 93 09/08/2016   BUN 28 (H) 09/08/2016   CREATININE 1.46 (H) 09/08/2016   CALCIUM 9.1 09/08/2016    Studies/Results: Dg Lumbar Spine 2-3 Views  Result Date: 09/10/2016 CLINICAL DATA:  L4-5 fixation. FLUOROSCOPY TIME:  63 seconds. Images: 2 EXAM: LUMBAR SPINE - 2-3 VIEW COMPARISON:  MRI November 03, 2015 FINDINGS: Pedicle rods and screws are seen at L4-5. An IVC filter is identified. IMPRESSION: L4-5 pedicle rods and screws. Electronically Signed   By: Dorise Bullion III M.D   On: 09/10/2016 14:08   Dg C-arm 1-60 Min  Result Date: 09/10/2016 CLINICAL DATA:  L4-5 fixation EXAM: DG C-ARM 61-120 MIN COMPARISON:  MRI November 03, 2015 FINDINGS: L4-5 pedicle rods and screws have been placed. An IVC filter is identified. IMPRESSION: L4-5 pedicle rods and screws as above.  Electronically Signed   By: Dorise Bullion III M.D   On: 09/10/2016 14:12    Assessment/Plan: Doing extremely well. Prefer skilled nursing facility upon discharge since I think she is a fall risk to be at home alone and will need time to recover and to build strength and endurance   LOS: 1 day    Agam Davenport S 09/11/2016, 8:06 AM

## 2016-09-12 LAB — BASIC METABOLIC PANEL
ANION GAP: 7 (ref 5–15)
Anion gap: 8 (ref 5–15)
BUN: 33 mg/dL — ABNORMAL HIGH (ref 6–20)
BUN: 36 mg/dL — ABNORMAL HIGH (ref 6–20)
CHLORIDE: 104 mmol/L (ref 101–111)
CHLORIDE: 107 mmol/L (ref 101–111)
CO2: 25 mmol/L (ref 22–32)
CO2: 23 mmol/L (ref 22–32)
CREATININE: 2.27 mg/dL — AB (ref 0.44–1.00)
CREATININE: 2.03 mg/dL — AB (ref 0.44–1.00)
Calcium: 8.6 mg/dL — ABNORMAL LOW (ref 8.9–10.3)
Calcium: 8.4 mg/dL — ABNORMAL LOW (ref 8.9–10.3)
GFR calc non Af Amer: 19 mL/min — ABNORMAL LOW (ref >60)
GFR calc non Af Amer: 22 mL/min — ABNORMAL LOW (ref >60)
GFR, EST AFRICAN AMERICAN: 22 mL/min — AB (ref >60)
GFR, EST AFRICAN AMERICAN: 25 mL/min — AB (ref >60)
Glucose, Bld: 102 mg/dL — ABNORMAL HIGH (ref 65–99)
Glucose, Bld: 95 mg/dL (ref 65–99)
Potassium: 5 mmol/L (ref 3.5–5.1)
Potassium: 5 mmol/L (ref 3.5–5.1)
Sodium: 137 mmol/L (ref 135–145)
Sodium: 137 mmol/L (ref 135–145)

## 2016-09-12 LAB — TYPE AND SCREEN
ABO/RH(D): O POS
Antibody Screen: NEGATIVE
UNIT DIVISION: 0
UNIT DIVISION: 0

## 2016-09-12 MED ORDER — DOCUSATE SODIUM 100 MG PO CAPS
100.0000 mg | ORAL_CAPSULE | Freq: Two times a day (BID) | ORAL | Status: DC
  Administered 2016-09-12 – 2016-09-13 (×2): 100 mg via ORAL
  Filled 2016-09-12 (×2): qty 1

## 2016-09-12 MED ORDER — SODIUM CHLORIDE 0.9 % IV BOLUS (SEPSIS)
500.0000 mL | Freq: Once | INTRAVENOUS | Status: AC
  Administered 2016-09-12: 500 mL via INTRAVENOUS

## 2016-09-12 MED ORDER — SENNA 8.6 MG PO TABS
1.0000 | ORAL_TABLET | Freq: Every day | ORAL | Status: DC
  Filled 2016-09-12: qty 1

## 2016-09-12 MED ORDER — BISACODYL 10 MG RE SUPP
10.0000 mg | Freq: Every day | RECTAL | Status: DC | PRN
Start: 2016-09-12 — End: 2016-09-13
  Administered 2016-09-12: 10 mg via RECTAL
  Filled 2016-09-12: qty 1

## 2016-09-12 NOTE — Progress Notes (Signed)
Low UOP yesterday, Cr up a little Moving legs well Minimal pain Hold lasix today 500 cc NS bolus Recheck BMP in AM

## 2016-09-12 NOTE — Progress Notes (Signed)
Physical Therapy Treatment Patient Details Name: Alison [name] MRN: [id number] DOB: [birth year] Today's Date: 09/12/2016    History of Present Illness Pt is an 82 y/o female s/p L4-L5 decompression/fusion. PMH including but not limited to CHF, COPD, HTN and hx of MI in 1997.    PT Comments    Pt presents with improved pain control today.  Continues to need human assist for safety and more challenging position changes, still appropriate for SNF at d/c. Encourage to walk short distances multiple times per day.  Follow Up Recommendations  SNF     Equipment Recommendations  None recommended by PT    Recommendations for Other Services       Precautions / Restrictions Precautions Precautions: Back;Fall Precaution Comments: OT provided handout. PT verbally reviewed 3/3 back precautions with pt. Required Braces or Orthoses: Spinal Brace Spinal Brace: Lumbar corset;Applied in sitting position    Mobility  Bed Mobility Overal bed mobility: Needs Assistance Bed Mobility: Rolling;Sidelying to Sit Rolling: Supervision Sidelying to sit: Min guard       General bed mobility comments: more independent getting up  Transfers Overall transfer level: Needs assistance Equipment used: Rolling walker (2 wheeled) Transfers: Sit to/from Stand Sit to Stand: Min guard         General transfer comment: occasional cues for safety but good strength, incr time  Ambulation/Gait Ambulation/Gait assistance: Supervision Ambulation Distance (Feet): 50 Feet Assistive device: Rolling walker (2 wheeled) Gait Pattern/deviations: Step-through pattern;Trunk flexed Gait velocity: decreased Gait velocity interpretation: Below normal speed for age/gender General Gait Details: wants to stand taller, but still slightly fwd flexed, get winded easily and fatigued   Stairs            Wheelchair Mobility    Modified Rankin (Stroke Patients Only)       Balance Overall balance assessment:  No apparent balance deficits (not formally assessed) Sitting-balance support: No upper extremity supported;Feet supported Sitting balance-Leahy Scale: Good     Standing balance support: Bilateral upper extremity supported;During functional activity Standing balance-Leahy Scale: Fair                      Cognition Arousal/Alertness: Awake/alert Behavior During Therapy: WFL for tasks assessed/performed Overall Cognitive Status: Within Functional Limits for tasks assessed                      Exercises      General Comments        Pertinent Vitals/Pain Pain Assessment: 0-10 Pain Score: 2  Pain Location: incision Pain Descriptors / Indicators: Sore Pain Intervention(s): Limited activity within patient's tolerance;Monitored during session    Home Living                      Prior Function            PT Goals (current goals can now be found in the care plan section) Progress towards PT goals: Progressing toward goals    Frequency    Min 5X/week      PT Plan Current plan remains appropriate    Co-evaluation             End of Session Equipment Utilized During Treatment: Gait belt;Back brace Activity Tolerance: Patient tolerated treatment well Patient left: in chair;with call bell/phone within reach     Time: 1210-1230 PT Time Calculation (min) (ACUTE ONLY): 20 min  Charges:  $Gait Training: 8-22 mins  G Codes:      Herbie Drape 09/12/2016, 12:51 PM

## 2016-09-13 ENCOUNTER — Encounter (HOSPITAL_COMMUNITY): Payer: Self-pay | Admitting: Neurological Surgery

## 2016-09-13 DIAGNOSIS — M545 Low back pain: Secondary | ICD-10-CM | POA: Diagnosis not present

## 2016-09-13 DIAGNOSIS — Z23 Encounter for immunization: Secondary | ICD-10-CM | POA: Diagnosis not present

## 2016-09-13 DIAGNOSIS — R262 Difficulty in walking, not elsewhere classified: Secondary | ICD-10-CM | POA: Diagnosis not present

## 2016-09-13 DIAGNOSIS — E785 Hyperlipidemia, unspecified: Secondary | ICD-10-CM | POA: Diagnosis not present

## 2016-09-13 DIAGNOSIS — M1990 Unspecified osteoarthritis, unspecified site: Secondary | ICD-10-CM | POA: Diagnosis not present

## 2016-09-13 DIAGNOSIS — M6281 Muscle weakness (generalized): Secondary | ICD-10-CM | POA: Diagnosis not present

## 2016-09-13 DIAGNOSIS — M4316 Spondylolisthesis, lumbar region: Secondary | ICD-10-CM | POA: Diagnosis not present

## 2016-09-13 DIAGNOSIS — J449 Chronic obstructive pulmonary disease, unspecified: Secondary | ICD-10-CM | POA: Diagnosis not present

## 2016-09-13 DIAGNOSIS — Z79899 Other long term (current) drug therapy: Secondary | ICD-10-CM | POA: Diagnosis not present

## 2016-09-13 DIAGNOSIS — I10 Essential (primary) hypertension: Secondary | ICD-10-CM | POA: Diagnosis not present

## 2016-09-13 DIAGNOSIS — M4326 Fusion of spine, lumbar region: Secondary | ICD-10-CM | POA: Diagnosis not present

## 2016-09-13 DIAGNOSIS — D509 Iron deficiency anemia, unspecified: Secondary | ICD-10-CM | POA: Diagnosis not present

## 2016-09-13 DIAGNOSIS — Z0000 Encounter for general adult medical examination without abnormal findings: Secondary | ICD-10-CM | POA: Diagnosis not present

## 2016-09-13 DIAGNOSIS — M79606 Pain in leg, unspecified: Secondary | ICD-10-CM | POA: Diagnosis not present

## 2016-09-13 DIAGNOSIS — R293 Abnormal posture: Secondary | ICD-10-CM | POA: Diagnosis not present

## 2016-09-13 DIAGNOSIS — I509 Heart failure, unspecified: Secondary | ICD-10-CM | POA: Diagnosis not present

## 2016-09-13 DIAGNOSIS — I2510 Atherosclerotic heart disease of native coronary artery without angina pectoris: Secondary | ICD-10-CM | POA: Diagnosis not present

## 2016-09-13 DIAGNOSIS — D649 Anemia, unspecified: Secondary | ICD-10-CM | POA: Diagnosis not present

## 2016-09-13 DIAGNOSIS — Z981 Arthrodesis status: Secondary | ICD-10-CM | POA: Diagnosis not present

## 2016-09-13 DIAGNOSIS — M1000 Idiopathic gout, unspecified site: Secondary | ICD-10-CM | POA: Diagnosis not present

## 2016-09-13 DIAGNOSIS — G894 Chronic pain syndrome: Secondary | ICD-10-CM | POA: Diagnosis not present

## 2016-09-13 DIAGNOSIS — N183 Chronic kidney disease, stage 3 (moderate): Secondary | ICD-10-CM | POA: Diagnosis not present

## 2016-09-13 DIAGNOSIS — R6889 Other general symptoms and signs: Secondary | ICD-10-CM | POA: Diagnosis not present

## 2016-09-13 LAB — BASIC METABOLIC PANEL
Anion gap: 9 (ref 5–15)
BUN: 36 mg/dL — AB (ref 6–20)
CALCIUM: 8.7 mg/dL — AB (ref 8.9–10.3)
CHLORIDE: 107 mmol/L (ref 101–111)
CO2: 20 mmol/L — AB (ref 22–32)
CREATININE: 1.66 mg/dL — AB (ref 0.44–1.00)
GFR calc non Af Amer: 27 mL/min — ABNORMAL LOW (ref >60)
GFR, EST AFRICAN AMERICAN: 32 mL/min — AB (ref >60)
Glucose, Bld: 107 mg/dL — ABNORMAL HIGH (ref 65–99)
Potassium: 4.5 mmol/L (ref 3.5–5.1)
Sodium: 136 mmol/L (ref 135–145)

## 2016-09-13 NOTE — Progress Notes (Signed)
Pt was ordered to be discharged, [facility] came and got pt, discharged to [facility] in [place] at 2055 alongside her personal belongings, pt reassured. Obasogie-Asidi, [name]

## 2016-09-13 NOTE — Clinical Social Work Placement (Signed)
   CLINICAL SOCIAL WORK PLACEMENT  NOTE 09/13/16 - DISCHARGED TO [facility] VIA AMBULANCE  Date:  09/13/2016  Patient Details  Name: Alison [name] MRN: [id number] Date of Birth: Sep 27, 1931  Clinical Social Work is seeking post-discharge placement for this patient at the [place] level of care (*CSW will initial, date and re-position this form in  chart as items are completed):  No (Patient informed CSW of her facility preference)   Patient/family provided with [facility] Work Department's list of facilities offering this level of care within the geographic area requested by the patient (or if unable, by the patient's family).  Yes   Patient/family informed of their freedom to choose among providers that offer the needed level of care, that participate in Medicare, Medicaid or managed care program needed by the patient, have an available bed and are willing to accept the patient.  Yes   Patient/family informed of [facility]'s ownership interest in [facility] and [facility], as well as of the fact that they are under no obligation to receive care at these facilities.  PASRR submitted to EDS on       PASRR number received on       Existing PASRR number confirmed on 09/13/16     FL2 transmitted to all facilities in geographic area requested by pt/family on 09/13/16     FL2 transmitted to all facilities within larger geographic area on       Patient informed that his/her managed care company has contracts with or will negotiate with certain facilities, including the following:        Yes   Patient/family informed of bed offers received.  Patient chooses bed at [facility]     Physician recommends and patient chooses bed at      Patient to be transferred to [facility] on 09/13/16.  Patient to be transferred to facility by Ambulance     Patient family notified on 09/13/16 of transfer.  Name of family member  notified:  [name] and Darnelle Catalan - daughters. Patient called daughters while CSW in room.     PHYSICIAN       Additional Comment:    _______________________________________________ Sable Feil, LCSW 09/13/2016, 10:11 PM

## 2016-09-13 NOTE — Care Management Note (Signed)
Case Management Note  Patient Details  Name: Alison [name] MRN: [id number] Date of Birth: 1930-12-19  Subjective/Objective:                    Action/Plan: Pt discharging to [facility] today. No further needs per CM.   Expected Discharge Date:                  Expected Discharge Plan:  Skilled Nursing Facility  In-House Referral:  Clinical Social Work  Discharge planning Services     Post Acute Care Choice:    Choice offered to:     DME Arranged:    DME Agency:     HH Arranged:    [facility] Agency:     Status of Service:  Completed, signed off  If discussed at [employer], dates discussed:    Additional Comments:  Pollie Friar, RN 09/13/2016, 3:39 PM

## 2016-09-13 NOTE — Progress Notes (Addendum)
Approached by Education officer, museum; patient is ready for discharge to [facility] this evening;facility is ready to receive; nursing report called to [facility]; social worker states that family has been contacted.

## 2016-09-13 NOTE — Discharge Summary (Signed)
Physician Discharge Summary  Patient ID: Alison [name] MRN: [id number] DOB/AGE: [birth year] 82 y.o.  Admit date: 09/10/2016 Discharge date: 09/13/2016  Admission Diagnoses: lumbar spondylolisthesis with stenosis   Discharge Diagnoses: same   Discharged Condition: good  Hospital Course: The patient was admitted on 09/10/2016 and taken to the operating room where the patient underwent decompression and instrumented fusion L4 L5. The patient tolerated the procedure well and was taken to the recovery room and then to the floor in stable condition. The hospital course was routine. There were no complications. The wound remained clean dry and intact. Pt had appropriate back soreness. No complaints of leg pain or new N/T/W. The patient remained afebrile with stable vital signs, and tolerated a regular diet. The patient continued to increase activities, and pain was well controlled with oral pain medications.   Consults: None  Significant Diagnostic Studies:  Results for orders placed or performed during the hospital encounter of 44/03/47  Basic metabolic panel  Result Value Ref Range   Sodium 137 135 - 145 mmol/L   Potassium 5.0 3.5 - 5.1 mmol/L   Chloride 104 101 - 111 mmol/L   CO2 25 22 - 32 mmol/L   Glucose, Bld 102 (H) 65 - 99 mg/dL   BUN 33 (H) 6 - 20 mg/dL   Creatinine, Ser 2.27 (H) 0.44 - 1.00 mg/dL   Calcium 8.6 (L) 8.9 - 10.3 mg/dL   GFR calc non Af Amer 19 (L) >60 mL/min   GFR calc Af Amer 22 (L) >60 mL/min   Anion gap 8 5 - 15  Basic metabolic panel  Result Value Ref Range   Sodium 137 135 - 145 mmol/L   Potassium 5.0 3.5 - 5.1 mmol/L   Chloride 107 101 - 111 mmol/L   CO2 23 22 - 32 mmol/L   Glucose, Bld 95 65 - 99 mg/dL   BUN 36 (H) 6 - 20 mg/dL   Creatinine, Ser 2.03 (H) 0.44 - 1.00 mg/dL   Calcium 8.4 (L) 8.9 - 10.3 mg/dL   GFR calc non Af Amer 22 (L) >60 mL/min   GFR calc Af Amer 25 (L) >60 mL/min   Anion gap 7 5 - 15  Basic metabolic panel  Result Value  Ref Range   Sodium 136 135 - 145 mmol/L   Potassium 4.5 3.5 - 5.1 mmol/L   Chloride 107 101 - 111 mmol/L   CO2 20 (L) 22 - 32 mmol/L   Glucose, Bld 107 (H) 65 - 99 mg/dL   BUN 36 (H) 6 - 20 mg/dL   Creatinine, Ser 1.66 (H) 0.44 - 1.00 mg/dL   Calcium 8.7 (L) 8.9 - 10.3 mg/dL   GFR calc non Af Amer 27 (L) >60 mL/min   GFR calc Af Amer 32 (L) >60 mL/min   Anion gap 9 5 - 15    Chest 2 View  Result Date: 09/08/2016 CLINICAL DATA:  Preoperative exam prior to lumbar spine surgery. The patient reports having a cold with cough for the past week. History of coronary artery disease with angioplasty, hypertension, previous MI and pulmonary embolism, [place] OPD. EXAM: CHEST  2 VIEW COMPARISON:  Chest x-ray and chest CT scan of August 22, 2016 FINDINGS: The lungs are well-expanded. The cardiac silhouette remains enlarged. The pulmonary interstitial markings are mildly prominent but have improved significantly since the previous study. There is no pulmonary vascular congestion. There is no pleural effusion. There is calcification in the wall of the aortic arch. There is  multilevel degenerative disc disease of the thoracic spine. IMPRESSION: COPD. Cardiomegaly without pulmonary edema. No evidence of pneumonia or pleural effusion. Thoracic aortic atherosclerosis. Electronically Signed   By: Thoma Paulsen  [place] M.D.   On: 09/08/2016 11:49   Dg Chest 2 View  Result Date: 08/22/2016 CLINICAL DATA:  Patient with worsening shortness of breath. Evaluate for pneumonia. EXAM: CHEST  2 VIEW COMPARISON:  Chest radiograph 03/12/2011. FINDINGS: Limited exam secondary to body habitus and patient positioning. Monitoring leads overlie the patient. Cardiomegaly. Tortuosity and calcification of the thoracic aorta. Bilateral mid and lower lung heterogeneous pulmonary opacities. Small bilateral pleural effusions. Thoracic spine degenerative changes. IMPRESSION: Cardiomegaly. Bibasilar heterogeneous opacities favored to represent  atelectasis. Small bilateral pleural effusions. Exam limited secondary to body habitus and patient positioning. Electronically Signed   By: Lovey Newcomer M.D.   On: 08/22/2016 15:27   Dg Lumbar Spine 2-3 Views  Result Date: 09/10/2016 CLINICAL DATA:  L4-5 fixation. FLUOROSCOPY TIME:  63 seconds. Images: 2 EXAM: LUMBAR SPINE - 2-3 VIEW COMPARISON:  MRI November 03, 2015 FINDINGS: Pedicle rods and screws are seen at L4-5. An IVC filter is identified. IMPRESSION: L4-5 pedicle rods and screws. Electronically Signed   By: Dorise Bullion III M.D   On: 09/10/2016 14:08   Ct Angio Chest Pe W And/or Wo Contrast  Result Date: 08/22/2016 CLINICAL DATA:  Two week history of shortness of breath. Elevated D-dimer. EXAM: CT ANGIOGRAPHY CHEST WITH CONTRAST TECHNIQUE: Multidetector CT imaging of the chest was performed using the standard protocol during bolus administration of intravenous contrast. Multiplanar CT image reconstructions and MIPs were obtained to evaluate the vascular anatomy. CONTRAST:  Contrast 80 cc Isovue 370 COMPARISON:  Standard CT chest 07/21/2007. Abdomen and pelvis CT 10/17/2014. FINDINGS: Cardiovascular: Heart is enlarged. Coronary artery calcification is noted. No evidence of pericardial effusion. Atherosclerotic calcification is noted in the wall of the thoracic aorta. No filling defect within the opacified pulmonary arteries to suggest the presence of an acute pulmonary embolus. Mediastinum/Nodes: 9 mm short axis prevascular lymph node is unchanged. No mediastinal lymphadenopathy There is no hilar lymphadenopathy. The esophagus has normal imaging features. There is no axillary lymphadenopathy. Lungs/Pleura: Centrilobular and paraseptal emphysema noted bilaterally. Subsegmental atelectasis or linear scarring noted in both lower lungs with compressive atelectasis in the lower lobes bilaterally. Tiny bilateral pleural effusions are evident. Upper Abdomen: 15 mm right adrenal nodule cannot be further  characterized on today's study. This is stable since 10/17/2014 when imaging features allow characterization as benign adrenal adenoma. Left adrenal gland is unremarkable. Multiple right renal cysts are evident. Atherosclerotic calcification noted in the abdominal aorta, incompletely visualized. Musculoskeletal: Bone windows reveal no worrisome lytic or sclerotic osseous lesions. Superior endplate compression deformity at T11 is stable. Review of the MIP images confirms the above findings. IMPRESSION: 1. No CT evidence for acute pulmonary embolus. 2. Compressive atelectasis with tiny bilateral pleural effusions. 3. 15 mm right adrenal adenoma. Electronically Signed   By: Misty Stanley M.D.   On: 08/22/2016 18:09   Dg C-arm 1-60 Min  Result Date: 09/10/2016 CLINICAL DATA:  L4-5 fixation EXAM: DG C-ARM 61-120 MIN COMPARISON:  MRI November 03, 2015 FINDINGS: L4-5 pedicle rods and screws have been placed. An IVC filter is identified. IMPRESSION: L4-5 pedicle rods and screws as above. Electronically Signed   By: Dorise Bullion III M.D   On: 09/10/2016 14:12    Antibiotics:  Anti-infectives    Start     Dose/Rate Route Frequency Ordered Stop   09/10/16  1900  ceFAZolin (ANCEF) IVPB 1 g/50 mL premix     1 g 100 mL/hr over 30 Minutes Intravenous Every 8 hours 09/10/16 1600 09/11/16 0404   09/10/16 1258  vancomycin (VANCOCIN) powder  Status:  Discontinued       As needed 09/10/16 1258 09/10/16 1326   09/10/16 1145  bacitracin 50,000 Units in sodium chloride irrigation 0.9 % 500 mL irrigation  Status:  Discontinued       As needed 09/10/16 1145 09/10/16 1326   09/10/16 0820  ceFAZolin (ANCEF) IVPB 2g/100 mL premix     2 g 200 mL/hr over 30 Minutes Intravenous On call to O.R. 09/10/16 0820 09/10/16 1104      Discharge Exam: Blood pressure (!) 125/57, pulse 71, temperature 97.7 F (36.5 C), temperature source Oral, resp. rate 20, height 5\' 1"  (1.549 m), weight 100.2 kg (221 lb), SpO2 94 %. Neurologic:  Grossly normal Dressing dry  Discharge Medications:     Medication List    TAKE these medications   acetaminophen 650 MG CR tablet Commonly known as:  TYLENOL Take 650 mg by mouth 2 (two) times daily.   ADVAIR DISKUS 250-50 MCG/DOSE Aepb Generic drug:  Fluticasone-Salmeterol Inhale 1 Inhaler into the lungs 2 (two) times daily.   allopurinol 100 MG tablet Commonly known as:  ZYLOPRIM Take 100 mg by mouth daily.   clopidogrel 75 MG tablet Commonly known as:  PLAVIX TAKE 1 TABLET BY MOUTH EVERY DAY   CRESTOR 10 MG tablet Generic drug:  rosuvastatin TAKE 1 TABLET EVERY DAY What changed:  See the new instructions.   ferrous sulfate 325 (65 FE) MG tablet Take 325 mg by mouth daily.   furosemide 40 MG tablet Commonly known as:  LASIX Take 1 tablet (40 mg total) by mouth daily. Take 1 tablet twice a day for Nov 13, 14, 15 then take one daily. What changed:  additional instructions   losartan 25 MG tablet Commonly known as:  COZAAR TAKE 1 TABLET BY MOUTH EVERY DAY   metoprolol succinate 50 MG 24 hr tablet Commonly known as:  TOPROL-XL TAKE 1 TABLET EVERY DAY WITH OR IMMEDIATELY FOLLOWING A MEAL   nitroGLYCERIN 0.4 MG SL tablet Commonly known as:  NITROSTAT Place 0.4 mg under the tongue every 5 (five) minutes as needed for chest pain.   Vitamin D3 1000 units Caps Take 1,000 Units by mouth daily.            Durable Medical Equipment        Start     Ordered   09/10/16 1601  DME Walker rolling  Once    Question:  Patient needs a walker to treat with the following condition  Answer:  S/P lumbar spinal fusion   09/10/16 1600      Disposition: SNF   Final Dx: lumbar decompression and instrumented fusion L4 L5  Discharge Instructions    Call MD for:  difficulty breathing, headache or visual disturbances    Complete by:  As directed    Call MD for:  persistant nausea and vomiting    Complete by:  As directed    Call MD for:  redness, tenderness, or signs of  infection (pain, swelling, redness, odor or green/yellow discharge around incision site)    Complete by:  As directed    Call MD for:  severe uncontrolled pain    Complete by:  As directed    Call MD for:  temperature >100.4    Complete by:  As directed    Diet - low sodium heart healthy    Complete by:  As directed    Discharge instructions    Complete by:  As directed    No heavy lifting or strenuous activity   Increase activity slowly    Complete by:  As directed    Remove dressing in 24 hours    Complete by:  As directed       Follow-up Information    Sydney Azure S, MD. Schedule an appointment as soon as possible for a visit in 3 week(s).   Specialty:  Neurosurgery Contact information: [address] [place] [address] [phone]            Signed: Eustace Moore 09/13/2016, 2:36 PM

## 2016-09-13 NOTE — Progress Notes (Signed)
Patient ID: Alison [name], female   DOB: 23-Nov-1930, 82 y.o.   MRN: [id number] Subjective: Patient reports back a ;ittle sore only, no leg pain, walking straighter  Objective: Vital signs in last 24 hours: Temp:  [97.9 F (36.6 C)-98.1 F (36.7 C)] 97.9 F (36.6 C) (12/04 0523) Pulse Rate:  [72-80] 77 (12/04 0523) Resp:  [16-20] 20 (12/04 0523) BP: (108-126)/(52-65) 126/53 (12/04 0523) SpO2:  [92 %-98 %] 94 % (12/04 0523)  Intake/Output from previous day: No intake/output data recorded. Intake/Output this shift: No intake/output data recorded.  Neurologic: Grossly normal  Lab Results: Lab Results  Component Value Date   WBC 11.2 (H) 09/08/2016   HGB 9.6 (L) 09/08/2016   HCT 30.6 (L) 09/08/2016   MCV 91.6 09/08/2016   PLT 200 09/08/2016   Lab Results  Component Value Date   INR 1.11 09/08/2016   BMET Lab Results  Component Value Date   NA 136 09/13/2016   K 4.5 09/13/2016   CL 107 09/13/2016   CO2 20 (L) 09/13/2016   GLUCOSE 107 (H) 09/13/2016   BUN 36 (H) 09/13/2016   CREATININE 1.66 (H) 09/13/2016   CALCIUM 8.7 (L) 09/13/2016    Studies/Results: No results found.  Assessment/Plan: Doing well   LOS: 3 days    Marijo Quizon S 09/13/2016, 8:03 AM

## 2016-09-13 NOTE — Clinical Social Work Note (Signed)
Clinical Social Work Assessment  Patient Details  Name: Alison [name] MRN: [id number] Date of Birth: [birth year]  Date of referral:  09/10/16               Reason for consult:  Facility Placement                Permission sought to share information with:  Family Supports Permission granted to share information::  Yes, Verbal Permission Granted  Name::     Alison [name]::     Relationship::  Daughter  Contact Information:  [phone] (home),  [phone] (mobile)   Housing/Transportation Living arrangements for the past 2 months:  Single Family Home Source of Information:  Patient Patient Interpreter Needed:  None Criminal Activity/Legal Involvement Pertinent to Current Situation/Hospitalization:  Yes Significant Relationships:  Adult Children Lives with:    Do you feel safe going back to the place where you live?  No (Patient in agreement with ST rehab prior to returning home) Need for family participation in patient care:  Yes (Comment)  Care giving concerns:  Patient reported that she lives with her son that works and she would be home alone while her son is at work.  Social Worker assessment / plan:  CSW talked with patient at the bedside regarding discharge planning and recommendation of ST rehab. Patient was sitting up in bed and was awake, alert and open to talking with CSW. Patient reported that she lives with her son who works 3rd shift. She has 5 other children who all live nearby - 2 in [place], 1 in [place], 1 in [place], and 1 in [place]. Patient advised CSW that her facility preference is [facility].  Employment status:  RetiredInsurance information:  Biomedical scientist) PT Recommendations:  [place] / Referral to community resources:  [place] (Patient provided CSW with faciity preference)  Patient/Family's Response to care:  Alison [name] expressed no concerns  regarding her care during hospitalization.  Patient/Family's Understanding of and Emotional Response to Diagnosis, Current Treatment, and Prognosis:  Not discussed.  Emotional Assessment Appearance:  Appears younger than stated age Attitude/Demeanor/Rapport:  Unable to Assess (Appropriate) Affect (typically observed):  Appropriate, Pleasant Orientation:  Oriented to Self, Oriented to Place, Oriented to  Time, Oriented to Situation Alcohol / Substance use:  Never Used Psych involvement (Current and /or in the community):  No (Comment)  Discharge Needs  Concerns to be addressed:  Discharge Planning Concerns Readmission within the last 30 days:  No Current discharge risk:  None Barriers to Discharge:  No Barriers Identified   Alison Feil, LCSW 09/13/2016, 10:00 PM

## 2016-09-13 NOTE — NC FL2 (Signed)
[place] MEDICAID FL2 LEVEL OF CARE SCREENING TOOL     IDENTIFICATION  Patient Name: Alison [name] Birthdate: Mar 08, 1931 Sex: female Admission Date (Current Location): 09/10/2016  [facility] and [place] Number:  [employer] and Address:  [facility] [place] [address], [place], [place] [address]      Provider Number: [id number]  Attending Physician Name and Address:  Eustace Moore, MD  Relative Name and Phone Number:  [name] Daughter - [phone] (h)  [phone] (mobile)     Current Level of Care: Hospital Recommended Level of Care: [place] Prior Approval Number:    Date Approved/Denied:   PASRR Number: [id number] (Eff. 03/05/10)  Discharge Plan: SNF    Current Diagnoses: Patient Active Problem List   Diagnosis Date Noted  . S/P lumbar spinal fusion 09/10/2016  . Hypocalcemia 09/03/2013  . Leg edema 04/06/2013  . History of pulmonary embolus (PE) 01/05/2011  . HYPERLIPIDEMIA-MIXED 06/24/2009  . CAD, NATIVE VESSEL 06/24/2009    Orientation RESPIRATION BLADDER Height & Weight     Self, Time, Situation, Place  Normal Continent Weight: 221 lb (100.2 kg) Height:  5\' 1"  (154.9 cm)  BEHAVIORAL SYMPTOMS/MOOD NEUROLOGICAL BOWEL NUTRITION STATUS      Continent Diet (Heart healthy)  AMBULATORY STATUS COMMUNICATION OF NEEDS Skin   Limited Assist Verbally Other (Comment) (Incision back)                       Personal Care Assistance Level of Assistance  Bathing, Feeding, Dressing Bathing Assistance: Maximum assistance (Upper body-supervision and Lower body-mod assist) Feeding assistance: Independent Dressing Assistance: Maximum assistance (Upper body-supervision and Lower body-mod assist)     Functional Limitations Info  Sight, Hearing, Speech Sight Info: Impaired Hearing Info: Impaired Speech Info: Adequate    SPECIAL CARE FACTORS FREQUENCY  PT (By licensed PT), OT (By licensed OT)     PT  Frequency: Evaluated 12/2 and a minimum of 5X per week therapy recommended OT Frequency: Evaluated 12/2 and a minimum of 2X per week therapy recommended            Contractures Contractures Info: Not present    Additional Factors Info  Code Status, Allergies Code Status Info: Full Allergies Info: No known allergies           Current Medications (09/13/2016):  This is the current hospital active medication list Current Facility-Administered Medications  Medication Dose Route Frequency Provider Last Rate Last Dose  . 0.9 %  sodium chloride infusion  250 mL Intravenous Continuous Eustace Moore, MD      . 0.9 % NaCl with KCl 20 mEq/ L  infusion   Intravenous Continuous Eustace Moore, MD 75 mL/hr at 09/10/16 4585    . acetaminophen (TYLENOL) tablet 650 mg  650 mg Oral Q4H PRN Eustace Moore, MD       Or  . acetaminophen (TYLENOL) suppository 650 mg  650 mg Rectal Q4H PRN Eustace Moore, MD      . bisacodyl (DULCOLAX) suppository 10 mg  10 mg Rectal Daily PRN Eustace Moore, MD   10 mg at 09/12/16 1617  . docusate sodium (COLACE) capsule 100 mg  100 mg Oral BID Kevan Ny Ditty, MD   100 mg at 09/13/16 1048  . ferrous sulfate tablet 325 mg  325 mg Oral Daily Eustace Moore, MD   325 mg at 09/13/16 1048  . furosemide (LASIX) tablet 40 mg  40  mg Oral Daily Eustace Moore, MD   40 mg at 09/11/16 1041  . losartan (COZAAR) tablet 25 mg  25 mg Oral Daily Eustace Moore, MD   25 mg at 09/13/16 1048  . menthol-cetylpyridinium (CEPACOL) lozenge 3 mg  1 lozenge Oral PRN Eustace Moore, MD       Or  . phenol [facility]) mouth spray 1 spray  1 spray Mouth/Throat PRN Eustace Moore, MD      . metoprolol succinate (TOPROL-XL) 24 hr tablet 50 mg  50 mg Oral Daily Eustace Moore, MD   50 mg at 09/13/16 1048  . mometasone-formoterol (DULERA) 200-5 MCG/ACT inhaler 2 puff  2 puff Inhalation BID Eustace Moore, MD   2 puff at 09/13/16 0800  . morphine 2 MG/ML injection 1-4 mg  1-4 mg Intravenous Q3H PRN Eustace Moore, MD   1 mg at 09/10/16 2109  . nitroGLYCERIN (NITROSTAT) SL tablet 0.4 mg  0.4 mg Sublingual Q5 min PRN Eustace Moore, MD      . ondansetron [facility]) injection 4 mg  4 mg Intravenous Q4H PRN Eustace Moore, MD      . oxyCODONE-acetaminophen (PERCOCET/ROXICET) 5-325 MG per tablet 1-2 tablet  1-2 tablet Oral Q4H PRN Eustace Moore, MD   1 tablet at 09/12/16 [phone]  . senna (SENOKOT) tablet 8.6 mg  1 tablet Oral QHS Eustace Moore, MD      . sodium chloride flush (NS) 0.9 % injection 3 mL  3 mL Intravenous Q12H Eustace Moore, MD   3 mL at 09/13/16 1049  . sodium chloride flush (NS) 0.9 % injection 3 mL  3 mL Intravenous PRN Eustace Moore, MD         Discharge Medications: Please see discharge summary for a list of discharge medications.  Relevant Imaging Results:  Relevant Lab Results:   Additional Information ss# [id number].   Sable Feil, LCSW

## 2016-09-13 NOTE — Progress Notes (Signed)
Physical Therapy Treatment Patient Details Name: Alison [name] MRN: [id number] DOB: August 30, 1931 Today's Date: 09/13/2016    History of Present Illness Pt is an 82 y/o female s/p L4-L5 decompression/fusion. PMH including but not limited to CHF, COPD, HTN and hx of MI in 1997.    PT Comments    Pt progressing slowly towards physical therapy goals. Back precautions were reviewed several times throughout session and pt had a difficult time recalling what they were, or following through with maintaining. Continue to feel that SNF is an appropriate d/c disposition for this patient. Will continue to follow.   Follow Up Recommendations  SNF     Equipment Recommendations  None recommended by PT    Recommendations for Other Services       Precautions / Restrictions Precautions Precautions: Back;Fall Precaution Booklet Issued: No Precaution Comments: Reviewed handout verbally several timest hroughout session and pt unable to repeat precautions back to therapist.  Required Braces or Orthoses: Spinal Brace Spinal Brace: Lumbar corset;Applied in sitting position Restrictions Weight Bearing Restrictions: No    Mobility  Bed Mobility Overal bed mobility: Needs Assistance Bed Mobility: Rolling;Sit to Sidelying Rolling: Supervision       Sit to sidelying: Min assist General bed mobility comments: Assist for LE elevation back into bed. Pt required cueing to get brace off as she was wanting to immediately lay down as soon as she sat EOB  Transfers Overall transfer level: Needs assistance Equipment used: Rolling walker (2 wheeled) Transfers: Sit to/from Stand Sit to Stand: Min guard         General transfer comment: occasional cues for safety but good strength, incr time  Ambulation/Gait Ambulation/Gait assistance: Supervision;Min guard Ambulation Distance (Feet): 70 Feet Assistive device: Rolling walker (2 wheeled) Gait Pattern/deviations: Step-through pattern;Decreased  stride length;Trunk flexed Gait velocity: decreased Gait velocity interpretation: Below normal speed for age/gender General Gait Details: wants to stand taller, but still slightly fwd flexed, get winded easily and fatigued   Stairs            Wheelchair Mobility    Modified Rankin (Stroke Patients Only)       Balance Overall balance assessment: Needs assistance Sitting-balance support: Feet supported;No upper extremity supported Sitting balance-Leahy Scale: Good     Standing balance support: No upper extremity supported;During functional activity Standing balance-Leahy Scale: Poor Standing balance comment: leaning on sink to wash hands. Unable to stand unsupported.                     Cognition Arousal/Alertness: Awake/alert Behavior During Therapy: WFL for tasks assessed/performed Overall Cognitive Status: Within Functional Limits for tasks assessed                      Exercises      General Comments        Pertinent Vitals/Pain Pain Assessment: Faces Faces Pain Scale: Hurts little more Pain Location: back Pain Descriptors / Indicators: Operative site guarding;Discomfort Pain Intervention(s): Limited activity within patient's tolerance;Monitored during session;Repositioned    Home Living                      Prior Function            PT Goals (current goals can now be found in the care plan section) Acute Rehab PT Goals Patient Stated Goal: Independence PT Goal Formulation: With patient Time For Goal Achievement: 09/25/16 Potential to Achieve Goals: Fair Progress towards PT goals: Progressing toward  goals    Frequency    Min 5X/week      PT Plan Current plan remains appropriate    Co-evaluation             End of Session Equipment Utilized During Treatment: Gait belt;Back brace Activity Tolerance: Patient tolerated treatment well Patient left: in bed;with bed alarm set;with call bell/phone within reach      Time: 1341-1357 PT Time Calculation (min) (ACUTE ONLY): 16 min  Charges:  $Gait Training: 8-22 mins                    G Codes:      Thelma Comp 10-11-2016, 3:07 PM   Rolinda Roan, PT, DPT Acute Rehabilitation Services Pager: [phone]

## 2016-09-14 ENCOUNTER — Inpatient Hospital Stay
Admission: RE | Admit: 2016-09-14 | Discharge: 2016-09-29 | Disposition: A | Discharge status: Home / Self Care | Payer: Medicare Other | Source: Ambulatory Visit | Attending: Internal Medicine | Admitting: Internal Medicine

## 2016-09-14 ENCOUNTER — Encounter: Payer: Self-pay | Admitting: Internal Medicine

## 2016-09-14 ENCOUNTER — Non-Acute Institutional Stay (SKILLED_NURSING_FACILITY): Payer: Medicare Other | Admitting: Internal Medicine

## 2016-09-14 DIAGNOSIS — D649 Anemia, unspecified: Secondary | ICD-10-CM | POA: Diagnosis not present

## 2016-09-14 DIAGNOSIS — N183 Chronic kidney disease, stage 3 unspecified: Secondary | ICD-10-CM | POA: Insufficient documentation

## 2016-09-14 DIAGNOSIS — I10 Essential (primary) hypertension: Secondary | ICD-10-CM

## 2016-09-14 DIAGNOSIS — N184 Chronic kidney disease, stage 4 (severe): Secondary | ICD-10-CM | POA: Insufficient documentation

## 2016-09-14 DIAGNOSIS — I2510 Atherosclerotic heart disease of native coronary artery without angina pectoris: Secondary | ICD-10-CM | POA: Diagnosis not present

## 2016-09-14 DIAGNOSIS — Z981 Arthrodesis status: Secondary | ICD-10-CM | POA: Diagnosis not present

## 2016-09-14 DIAGNOSIS — E785 Hyperlipidemia, unspecified: Secondary | ICD-10-CM | POA: Diagnosis not present

## 2016-09-14 NOTE — Progress Notes (Addendum)
Provider:  Veleta Miners Location:   [facility] Room Number: 144/P Place of Service:  SNF ([id number])  PCP: Maggie Font, MD Patient Care Team: [name], MD as PCP - General (Family Medicine) Satira Sark, MD as Consulting Physician (Cardiology) Eustace Moore, MD as Consulting Physician (Neurosurgery)  Extended Emergency Contact Information Primary Emergency Contact: Partee,Jeraldine Address: [address]          [place], [place] [address] Johnnette Litter of [place] Phone: [phone] Mobile Phone: [phone] Relation: Daughter  Code Status: Full Code Goals of Care: Advanced Directive information Advanced Directives 09/14/2016  Does Patient Have a Medical Advance Directive? Yes  Type of Advance Directive (No Data)  Does patient want to make changes to medical advance directive? No - Patient declined  Copy of [place] in Chart? -  Would patient like information on creating a medical advance directive? -      Chief Complaint  Patient presents with  . New Admit To SNF    HPI: Patient is a 82 y.o. female seen today for admission to SNF for therapy after Decompression and Fusion of L4-L5.   Patient has h/o CHF with recent EF of 59%. , Hypertension, Chronic kidney disease stage 3, Hyperlipidemia, CAD s/p Stent placement, H/O recurrent PE and DVT  S/P IVC Filter placement as patient could not tolerate Coumadin.COPD and Gout.  Patient failed conservative therapy for Stenosis and underwent Decompression and L4-5 Fusion.on 12/01. She had non eventful Post op recovery. On her labs she did have some worsening of her renal function which is now coming close to her baseline.  Patient is doing well in SNF. Was able to walk with the walker without any pain. She is very happy as her pain is much better. She lives with her son and planning to go back and stay with him.         Past Medical History:  Diagnosis Date  . CHF  (congestive heart failure) ([place])    ?  in ed 08/23/16  . Chronic anemia    takes Iron pill daily  . Chronic back pain    stenosis/spondylolisthesis  . COPD (chronic obstructive pulmonary disease) ([place])   . Coronary atherosclerosis of native coronary artery    Occluded circ with collaterals and 90% RCA (previous stents at [facility], details not clear), LVEF 48% 1/11 - inferior and inferolateral scar by Myoview 1/11,   . Gout    takes Allopurinol daily  . History of blood clots seeral yrs ago   abdomen  . History of blood transfusion    no abnormal reaction noted  . Hyperlipidemia    takes Crestor daily  . Hypertension    takes Losartan and Metoprolol daily  . Myocardial infarction 1997  . Nocturia   . Osteoarthritis   . Peripheral edema    takes Lasix daily  . Pneumonia    hx of--f20+ yrs ago  . Pulmonary embolism (HCC)    History of recurrent DVT and PE status post IVC filter, previously on Coumadin   Past Surgical History:  Procedure Laterality Date  . ABDOMINAL HYSTERECTOMY  1965   partial  . APPENDECTOMY    . BACK SURGERY    . cataract surgery Bilateral   . CORONARY ANGIOPLASTY  1997   3 stents  . cyst removed from back  2006  . LAMINECTOMY WITH POSTERIOR LATERAL ARTHRODESIS LEVEL 1 N/A 09/10/2016   Procedure: Posterior Lateral Fusion - Lumbar four-Lumbar  five, lumbar laminectomy and pedicle screw fixation  - Lumbar four-Lumbar five;  Surgeon: Eustace Moore, MD;  Location: [facility];  Service: Neurosurgery;  Laterality: N/A;  . Left adrenalectomy  2009  . Right breast biopsy  2008  . Right knee replacement  2011    reports that she has never smoked. She has never used smokeless tobacco. She reports that she does not drink alcohol or use drugs. Social History   Social History  . Marital status: Widowed    Spouse name: N/A  . Number of children: N/A  . Years of education: N/A   Occupational History  . Not on file.   Social History Main Topics  . Smoking status: Never  Smoker  . Smokeless tobacco: Never Used  . Alcohol use No  . Drug use: No  . Sexual activity: Not on file   Other Topics Concern  . Not on file   Social History Narrative  . No narrative on file    Functional Status Survey:    Family History  Problem Relation Age of Onset  . Stroke Father     Health Maintenance  Topic Date Due  . TETANUS/TDAP  12/29/1949  . ZOSTAVAX  12/28/1992  . PNA vac Low Risk Adult (1 of 2 - PCV13) 09/10/2017 (Originally 12/28/1997)  . INFLUENZA VACCINE  Completed  . DEXA SCAN  Completed    No Known Allergies    Medication List       Accurate as of 09/14/16 11:22 AM. Always use your most recent med list.          acetaminophen 650 MG CR tablet Commonly known as:  TYLENOL Take 650 mg by mouth 2 (two) times daily.   ADVAIR DISKUS 250-50 MCG/DOSE Aepb Generic drug:  Fluticasone-Salmeterol Inhale 1 Inhaler into the lungs 2 (two) times daily.   allopurinol 100 MG tablet Commonly known as:  ZYLOPRIM Take 100 mg by mouth daily.   clopidogrel 75 MG tablet Commonly known as:  PLAVIX TAKE 1 TABLET BY MOUTH EVERY DAY   CRESTOR 10 MG tablet Generic drug:  rosuvastatin TAKE 1 TABLET EVERY DAY   ferrous sulfate 325 (65 FE) MG tablet Take 325 mg by mouth daily.   furosemide 40 MG tablet Commonly known as:  LASIX Take 40 mg by mouth daily.   losartan 25 MG tablet Commonly known as:  COZAAR TAKE 1 TABLET BY MOUTH EVERY DAY   metoprolol succinate 50 MG 24 hr tablet Commonly known as:  TOPROL-XL TAKE 1 TABLET EVERY DAY WITH OR IMMEDIATELY FOLLOWING A MEAL   nitroGLYCERIN 0.4 MG SL tablet Commonly known as:  NITROSTAT Place 0.4 mg under the tongue every 5 (five) minutes as needed for chest pain.   Vitamin D3 1000 units Caps Take 1,000 Units by mouth daily.       Review of Systems  Constitutional: Negative for activity change, appetite change, chills, diaphoresis, fatigue and fever.  HENT: Positive for sore throat. Negative for  postnasal drip and rhinorrhea.   Respiratory: Positive for cough and shortness of breath. Negative for apnea and chest tightness.        Mild SOB when walks  Cardiovascular: Negative for chest pain, palpitations and leg swelling.  Gastrointestinal: Negative for abdominal distention, abdominal pain, constipation, diarrhea, nausea and vomiting.  Genitourinary: Negative for decreased urine volume, dysuria, flank pain, hematuria and urgency.  Neurological: Positive for weakness. Negative for dizziness, tremors, syncope, light-headedness and headaches.    Vitals:   09/14/16 1121  BP: (!) 150/78  Pulse: 86  Resp: 18  Temp: 97.6 F (36.4 C)   There is no height or weight on file to calculate BMI. Physical Exam  Constitutional: She is oriented to person, place, and time. She appears well-developed and well-nourished.  HENT:  Head: Normocephalic.  Mouth/Throat: Oropharynx is clear and moist.  Cardiovascular: Normal rate, regular rhythm and normal heart sounds.   No murmur heard. Pulmonary/Chest: Effort normal and breath sounds normal. No respiratory distress. She has no wheezes.  Abdominal: Soft. Bowel sounds are normal. She exhibits no distension. There is no tenderness. There is no rebound and no guarding.  Musculoskeletal: She exhibits no edema.  Neurological: She is alert and oriented to person, place, and time.  Good strength in all extremities.  Psychiatric: She has a normal mood and affect. Her behavior is normal.    Labs reviewed: Basic Metabolic Panel:  Recent Labs  09/12/16 0215 09/12/16 0926 09/13/16 0526  NA 137 137 136  K 5.0 5.0 4.5  CL 104 107 107  CO2 25 23 20*  GLUCOSE 102* 95 107*  BUN 33* 36* 36*  CREATININE 2.27* 2.03* 1.66*  CALCIUM 8.6* 8.4* 8.7*   Liver Function Tests:  Recent Labs  08/22/16 1551  AST 17  ALT 12*  ALKPHOS 85  BILITOT 0.4  PROT 7.3  ALBUMIN 3.5   No results for input(s): LIPASE, AMYLASE in the last 8760 hours. No results for  input(s): AMMONIA in the last 8760 hours. CBC:  Recent Labs  07/08/16 1109 08/22/16 1551 09/08/16 1056  WBC 12.0* 14.2* 11.2*  NEUTROABS  --   --  8.0*  HGB 9.3* 9.4* 9.6*  HCT 31.2* 30.2* 30.6*  MCV 96.0 94.4 91.6  PLT 207 207 200   Cardiac Enzymes: No results for input(s): CKTOTAL, CKMB, CKMBINDEX, TROPONINI in the last 8760 hours. BNP: Invalid input(s): POCBNP No results found for: HGBA1C No results found for: TSH Lab Results  Component Value Date   VITAMINB12 309 08/20/2009   Lab Results  Component Value Date   FOLATE 7.3 08/20/2009   Lab Results  Component Value Date   IRON 67 11/12/2009   TIBC 262 11/12/2009   FERRITIN 476 (H) 11/12/2009    Imaging and Procedures obtained prior to SNF admission: Chest 2 View  Result Date: 09/08/2016 CLINICAL DATA:  Preoperative exam prior to lumbar spine surgery. The patient reports having a cold with cough for the past week. History of coronary artery disease with angioplasty, hypertension, previous MI and pulmonary embolism, [place] OPD. EXAM: CHEST  2 VIEW COMPARISON:  Chest x-ray and chest CT scan of August 22, 2016 FINDINGS: The lungs are well-expanded. The cardiac silhouette remains enlarged. The pulmonary interstitial markings are mildly prominent but have improved significantly since the previous study. There is no pulmonary vascular congestion. There is no pleural effusion. There is calcification in the wall of the aortic arch. There is multilevel degenerative disc disease of the thoracic spine. IMPRESSION: COPD. Cardiomegaly without pulmonary edema. No evidence of pneumonia or pleural effusion. Thoracic aortic atherosclerosis. Electronically Signed   By: David  Martinique M.D.   On: 09/08/2016 11:49    Assessment/Plan  S/P lumbar spinal fusion  Patient doing very well with pain controlled on tylenol. She continues her therapy and follow up with Neurosurgery.  Essential hypertension BP slightly high will continue to  follow.Continue on Toprol, Losartan   CAD with CHF with Normal EF  Patient weight is 227 lbs which is higher then her Last  weight at home of 221 lbs. She does seem Eu volemic. Continue to follow Weights. Continue Lasix 40 mg. Continue Plavix  Hyperlipidemia Continue Crestor.  Stage 3 chronic kidney disease Creat peaked to 2.27 but is down to 1.6. Which is closer to her baseline. Will continue to follow.  Anemia On Iron. HGB stable.     Family/ staff Communication:   Labs/tests ordered: CBC and BMP to repeat in 1 week.

## 2016-09-16 ENCOUNTER — Encounter: Payer: Self-pay | Admitting: Internal Medicine

## 2016-09-16 ENCOUNTER — Non-Acute Institutional Stay (SKILLED_NURSING_FACILITY): Payer: Medicare Other | Admitting: Internal Medicine

## 2016-09-16 DIAGNOSIS — R6889 Other general symptoms and signs: Secondary | ICD-10-CM | POA: Diagnosis not present

## 2016-09-16 NOTE — Progress Notes (Signed)
Location:   [facility] Room Number: 144/P Place of Service:  SNF [phone]) Provider:  Orpah Melter, MD  Patient Care Team: [name], MD as PCP - General (Family Medicine) Satira Sark, MD as Consulting Physician (Cardiology) Eustace Moore, MD as Consulting Physician (Neurosurgery)  Extended Emergency Contact Information Primary Emergency Contact: Partee,Jeraldine Address: [address]          [place], [place] [address] Johnnette Litter of [place] Phone: [phone] Mobile Phone: [phone] Relation: Daughter  Code Status:  Full Code Goals of care: Advanced Directive information Advanced Directives 09/16/2016  Does Patient Have a Medical Advance Directive? Yes  Type of Advance Directive [place]  Does patient want to make changes to medical advance directive? No - Patient declined  Copy of [place] in Chart? -  Would patient like information on creating a medical advance directive? -     Chief Complaint  Patient presents with  . Acute Visit    Mucus    HPI:  Pt is a 82 y.o. female seen today for an acute visit for Complaints of mucus congestion in her throat.  She says she occasionally gets this.  She denies any increased cough from baseline or any shortness of breath just feels she's got some "crud" in her throat  She is here for rehabilitation after decompression and fusion of L4-L5.  She also has a history of COPD is on Advair.  At this point her pain appears to be well controlled.       Past Medical History:  Diagnosis Date  . CHF (congestive heart failure) ([place])    ?  in ed 08/23/16  . Chronic anemia    takes Iron pill daily  . Chronic back pain    stenosis/spondylolisthesis  . COPD (chronic obstructive pulmonary disease) ([place])   . Coronary atherosclerosis of native coronary artery    Occluded circ with collaterals and 90% RCA (previous stents at [facility], details  not clear), LVEF 48% 1/11 - inferior and inferolateral scar by Myoview 1/11,   . Gout    takes Allopurinol daily  . History of blood clots seeral yrs ago   abdomen  . History of blood transfusion    no abnormal reaction noted  . Hyperlipidemia    takes Crestor daily  . Hypertension    takes Losartan and Metoprolol daily  . Myocardial infarction 1997  . Nocturia   . Osteoarthritis   . Peripheral edema    takes Lasix daily  . Pneumonia    hx of--f20+ yrs ago  . Pulmonary embolism (HCC)    History of recurrent DVT and PE status post IVC filter, previously on Coumadin   Past Surgical History:  Procedure Laterality Date  . ABDOMINAL HYSTERECTOMY  1965   partial  . APPENDECTOMY    . BACK SURGERY    . cataract surgery Bilateral   . CORONARY ANGIOPLASTY  1997   3 stents  . cyst removed from back  2006  . LAMINECTOMY WITH POSTERIOR LATERAL ARTHRODESIS LEVEL 1 N/A 09/10/2016   Procedure: Posterior Lateral Fusion - Lumbar four-Lumbar five, lumbar laminectomy and pedicle screw fixation  - Lumbar four-Lumbar five;  Surgeon: Eustace Moore, MD;  Location: [facility];  Service: Neurosurgery;  Laterality: N/A;  . Left adrenalectomy  2009  . Right breast biopsy  2008  . Right knee replacement  2011    No Known Allergies  Current  Outpatient Prescriptions on File Prior to Visit  Medication Sig Dispense Refill  . acetaminophen (TYLENOL) 650 MG CR tablet Take 650 mg by mouth 2 (two) times daily.     Marland Kitchen ADVAIR DISKUS 250-50 MCG/DOSE AEPB Inhale 1 Inhaler into the lungs 2 (two) times daily.     Marland Kitchen allopurinol (ZYLOPRIM) 100 MG tablet Take 100 mg by mouth daily.    . Cholecalciferol (VITAMIN D3) 1000 units CAPS Take 1,000 Units by mouth daily.     . clopidogrel (PLAVIX) 75 MG tablet TAKE 1 TABLET BY MOUTH EVERY DAY 90 tablet 2  . CRESTOR 10 MG tablet TAKE 1 TABLET EVERY DAY 90 tablet 2  . ferrous sulfate 325 (65 FE) MG tablet Take 325 mg by mouth daily.     . furosemide (LASIX) 40 MG tablet Take 40  mg by mouth daily.    Marland Kitchen losartan (COZAAR) 25 MG tablet TAKE 1 TABLET BY MOUTH EVERY DAY 90 tablet 3  . metoprolol succinate (TOPROL-XL) 50 MG 24 hr tablet TAKE 1 TABLET EVERY DAY WITH OR IMMEDIATELY FOLLOWING A MEAL 90 tablet 2  . nitroGLYCERIN (NITROSTAT) 0.4 MG SL tablet Place 0.4 mg under the tongue every 5 (five) minutes as needed for chest pain.      No current facility-administered medications on file prior to visit.      Review of Systems   In general is not complaining of shortness fever or chills.  Skin does not complain of rashes or itching surgical site lower back is currently covered.  Head ears eyes nose mouth and throat complains of some throat congestion does not complain of nasal congestion or discharge.  Respiratory does not complain of increased cough beyond her baseline does have a history of COPD does not complain of increased shortness of breath from baseline.  Cardiac does not complaining of any chest pain or palpitations.  Muscle skeletal currently is not complaining of pain status post lumbar decompression.  Neurologic does not complain of numbness does complain at times of weakness but apparently this is improving.    Immunization History  Administered Date(s) Administered  . Influenza,inj,Quad PF,36+ Mos 09/12/2016  . Influenza-Unspecified 09/12/2014   Pertinent  Health Maintenance Due  Topic Date Due  . PNA vac Low Risk Adult (1 of 2 - PCV13) 09/10/2017 (Originally 12/28/1997)  . INFLUENZA VACCINE  Completed  . DEXA SCAN  Completed   No flowsheet data found. Functional Status Survey:    Vitals:   09/16/16 1536  BP: (!) 142/74  Pulse: 69  Resp: 20  Temp: 97.9 F (36.6 C)  TempSrc: Oral    Physical Exam   In general this is a pleasant elderly female in no distress resting comfortably in bed she appears to be in good spirits.  Skin is warm and dry there's covering of the lower lumbar area status post surgery.  Bland Span did not note any  discharge visual acuity appears to be intact.  Oropharynx is clear mucous membranes moist tongue is midline.  Chest is clear to auscultation with somewhat reduced air entry there is no labored breathing.  Heart is somewhat distant heart sounds regular rate and rhythm-.  Muscle skeletal she does not really have significant lower extremity edema.  Neurologic she is able to move all extremities 4 to appears with good strength- touch sensation is intact lower extremities bilaterally  Psych she is alert and oriented pleasant and appropriate  Labs reviewed:  Recent Labs  09/12/16 0215 09/12/16 0926 09/13/16 0526  NA 137 137 136  K 5.0 5.0 4.5  CL 104 107 107  CO2 25 23 20*  GLUCOSE 102* 95 107*  BUN 33* 36* 36*  CREATININE 2.27* 2.03* 1.66*  CALCIUM 8.6* 8.4* 8.7*    Recent Labs  08/22/16 1551  AST 17  ALT 12*  ALKPHOS 85  BILITOT 0.4  PROT 7.3  ALBUMIN 3.5    Recent Labs  07/08/16 1109 08/22/16 1551 09/08/16 1056  WBC 12.0* 14.2* 11.2*  NEUTROABS  --   --  8.0*  HGB 9.3* 9.4* 9.6*  HCT 31.2* 30.2* 30.6*  MCV 96.0 94.4 91.6  PLT 207 207 200   No results found for: TSH No results found for: HGBA1C No results found for: CHOL, HDL, LDLCALC, LDLDIRECT, TRIG, CHOLHDL  Significant Diagnostic Results in last 30 days:  Chest 2 View  Result Date: 09/08/2016 CLINICAL DATA:  Preoperative exam prior to lumbar spine surgery. The patient reports having a cold with cough for the past week. History of coronary artery disease with angioplasty, hypertension, previous MI and pulmonary embolism, [place] OPD. EXAM: CHEST  2 VIEW COMPARISON:  Chest x-ray and chest CT scan of August 22, 2016 FINDINGS: The lungs are well-expanded. The cardiac silhouette remains enlarged. The pulmonary interstitial markings are mildly prominent but have improved significantly since the previous study. There is no pulmonary vascular congestion. There is no pleural effusion. There is calcification in  the wall of the aortic arch. There is multilevel degenerative disc disease of the thoracic spine. IMPRESSION: COPD. Cardiomegaly without pulmonary edema. No evidence of pneumonia or pleural effusion. Thoracic aortic atherosclerosis. Electronically Signed   By: David  Martinique M.D.   On: 09/08/2016 11:49   Dg Chest 2 View  Result Date: 08/22/2016 CLINICAL DATA:  Patient with worsening shortness of breath. Evaluate for pneumonia. EXAM: CHEST  2 VIEW COMPARISON:  Chest radiograph 03/12/2011. FINDINGS: Limited exam secondary to body habitus and patient positioning. Monitoring leads overlie the patient. Cardiomegaly. Tortuosity and calcification of the thoracic aorta. Bilateral mid and lower lung heterogeneous pulmonary opacities. Small bilateral pleural effusions. Thoracic spine degenerative changes. IMPRESSION: Cardiomegaly. Bibasilar heterogeneous opacities favored to represent atelectasis. Small bilateral pleural effusions. Exam limited secondary to body habitus and patient positioning. Electronically Signed   By: Lovey Newcomer M.D.   On: 08/22/2016 15:27   Dg Lumbar Spine 2-3 Views  Result Date: 09/10/2016 CLINICAL DATA:  L4-5 fixation. FLUOROSCOPY TIME:  63 seconds. Images: 2 EXAM: LUMBAR SPINE - 2-3 VIEW COMPARISON:  MRI November 03, 2015 FINDINGS: Pedicle rods and screws are seen at L4-5. An IVC filter is identified. IMPRESSION: L4-5 pedicle rods and screws. Electronically Signed   By: Dorise Bullion III M.D   On: 09/10/2016 14:08   Ct Angio Chest Pe W And/or Wo Contrast  Result Date: 08/22/2016 CLINICAL DATA:  Two week history of shortness of breath. Elevated D-dimer. EXAM: CT ANGIOGRAPHY CHEST WITH CONTRAST TECHNIQUE: Multidetector CT imaging of the chest was performed using the standard protocol during bolus administration of intravenous contrast. Multiplanar CT image reconstructions and MIPs were obtained to evaluate the vascular anatomy. CONTRAST:  Contrast 80 cc Isovue 370 COMPARISON:  Standard CT  chest 07/21/2007. Abdomen and pelvis CT 10/17/2014. FINDINGS: Cardiovascular: Heart is enlarged. Coronary artery calcification is noted. No evidence of pericardial effusion. Atherosclerotic calcification is noted in the wall of the thoracic aorta. No filling defect within the opacified pulmonary arteries to suggest the presence of an acute pulmonary embolus. Mediastinum/Nodes: 9 mm short axis prevascular lymph  node is unchanged. No mediastinal lymphadenopathy There is no hilar lymphadenopathy. The esophagus has normal imaging features. There is no axillary lymphadenopathy. Lungs/Pleura: Centrilobular and paraseptal emphysema noted bilaterally. Subsegmental atelectasis or linear scarring noted in both lower lungs with compressive atelectasis in the lower lobes bilaterally. Tiny bilateral pleural effusions are evident. Upper Abdomen: 15 mm right adrenal nodule cannot be further characterized on today's study. This is stable since 10/17/2014 when imaging features allow characterization as benign adrenal adenoma. Left adrenal gland is unremarkable. Multiple right renal cysts are evident. Atherosclerotic calcification noted in the abdominal aorta, incompletely visualized. Musculoskeletal: Bone windows reveal no worrisome lytic or sclerotic osseous lesions. Superior endplate compression deformity at T11 is stable. Review of the MIP images confirms the above findings. IMPRESSION: 1. No CT evidence for acute pulmonary embolus. 2. Compressive atelectasis with tiny bilateral pleural effusions. 3. 15 mm right adrenal adenoma. Electronically Signed   By: Misty Stanley M.D.   On: 08/22/2016 18:09   Dg C-arm 1-60 Min  Result Date: 09/10/2016 CLINICAL DATA:  L4-5 fixation EXAM: DG C-ARM 61-120 MIN COMPARISON:  MRI November 03, 2015 FINDINGS: L4-5 pedicle rods and screws have been placed. An IVC filter is identified. IMPRESSION: L4-5 pedicle rods and screws as above. Electronically Signed   By: Dorise Bullion III M.D   On:  09/10/2016 14:12    Assessment/Plan  #1-history of throat congestion-this appears to be fairly localized she is not complaining of any increased shortness breath or cough from baseline-will treat with Mucinex 600 mg twice a day for 5 days and continue to monitor for any changes.  [place], [place], [place]

## 2016-09-23 ENCOUNTER — Encounter: Payer: Self-pay | Admitting: Internal Medicine

## 2016-09-23 ENCOUNTER — Non-Acute Institutional Stay (SKILLED_NURSING_FACILITY): Payer: Medicare Other | Admitting: Internal Medicine

## 2016-09-23 DIAGNOSIS — I509 Heart failure, unspecified: Secondary | ICD-10-CM

## 2016-09-23 DIAGNOSIS — N183 Chronic kidney disease, stage 3 unspecified: Secondary | ICD-10-CM

## 2016-09-23 DIAGNOSIS — Z981 Arthrodesis status: Secondary | ICD-10-CM | POA: Diagnosis not present

## 2016-09-23 NOTE — Progress Notes (Signed)
Location:   [facility] Room Number: 144/P Place of Service:  SNF [phone]) Provider:  Orpah Melter, MD  Patient Care Team: [name], MD as PCP - General (Family Medicine) Satira Sark, MD as Consulting Physician (Cardiology) Eustace Moore, MD as Consulting Physician (Neurosurgery)  Extended Emergency Contact Information Primary Emergency Contact: Partee,Jeraldine Address: [address]          [place], [place] [address] Johnnette Litter of [place] Phone: [phone] Mobile Phone: [phone] Relation: Daughter  Code Status:  Full Code Goals of care: Advanced Directive information Advanced Directives 09/23/2016  Does Patient Have a Medical Advance Directive? Yes  Type of Advance Directive (No Data)  Does patient want to make changes to medical advance directive? No - Patient declined  Copy of [place] in Chart? -  Would patient like information on creating a medical advance directive? -     Chief Complaint  Patient presents with  . Acute Visit  Follow-up pain management  HPI:  Pt is a 82 y.o. female seen today for an acute visit for discussion of pain management.  She is here for therapy after decompression and fusion of L4-L5-she is followed by neurosurgery and actually has an appointment with them tomorrow.  She is only receiving Tylenol for pain-nursing staff has left a note that attempts she'll complain of increased pain I did discuss this with her this afternoon.  She states this pain is intermittent and not persistent did not really related appears to activity with therapy etc.  We did discuss options including tramadol which she says has not been very effective in the past and made her sleepy.  We also discussed possibly a narcotic Vicodin or muscle relaxer.  After our discussion she stated she would probably just wait to see her neurosurgeon tomorrow to see what he would recommend-she says the  pain is tolerable and again intermitted and she be comfortable discussing this with him tomorrow to get his insights.  Currently she is resting in her chair comfortably is able to stand does not appear with significant discomfort.  The surgical site looks to be healing unremarkably.  She does have history of congestive heart failure as well she is on Lasix her weight actually appears to be trending down she has fairly mild edema is not complaining of shortness of breath appears to be doing well in this regard.   .     Past Medical History:  Diagnosis Date  . CHF (congestive heart failure) ([facility])    ?  in ed 08/23/16  . Chronic anemia    takes Iron pill daily  . Chronic back pain    stenosis/spondylolisthesis  . COPD (chronic obstructive pulmonary disease) ([facility])   . Coronary atherosclerosis of native coronary artery    Occluded circ with collaterals and 90% RCA (previous stents at [facility], details not clear), LVEF 48% 1/11 - inferior and inferolateral scar by Myoview 1/11,   . Gout    takes Allopurinol daily  . History of blood clots seeral yrs ago   abdomen  . History of blood transfusion    no abnormal reaction noted  . Hyperlipidemia    takes Crestor daily  . Hypertension    takes Losartan and Metoprolol daily  . Myocardial infarction 1997  . Nocturia   . Osteoarthritis   . Peripheral edema    takes Lasix daily  . Pneumonia    hx of--f20+ yrs ago  .  Pulmonary embolism (H[facility])    History of recurrent DVT and PE status post IVC filter, previously on Coumadin   Past Surgical History:  Procedure Laterality Date  . ABDOMINAL HYSTERECTOMY  1965   partial  . APPENDECTOMY    . BACK SURGERY    . cataract surgery Bilateral   . CORONARY ANGIOPLASTY  1997   3 stents  . cyst removed from back  2006  . LAMINECTOMY WITH POSTERIOR LATERAL ARTHRODESIS LEVEL 1 N/A 09/10/2016   Procedure: Posterior Lateral Fusion - Lumbar four-Lumbar five, lumbar laminectomy and pedicle screw fixation   - Lumbar four-Lumbar five;  Surgeon: Eustace Moore, MD;  Location: [facility];  Service: Neurosurgery;  Laterality: N/A;  . Left adrenalectomy  2009  . Right breast biopsy  2008  . Right knee replacement  2011    No Known Allergies  Current Outpatient Prescriptions on File Prior to Visit  Medication Sig Dispense Refill  . acetaminophen (TYLENOL) 650 MG CR tablet Take 650 mg by mouth 2 (two) times daily.     Marland Kitchen ADVAIR DISKUS 250-50 MCG/DOSE AEPB Inhale 1 Inhaler into the lungs 2 (two) times daily.     Marland Kitchen allopurinol (ZYLOPRIM) 100 MG tablet Take 100 mg by mouth daily.    . Cholecalciferol (VITAMIN D3) 1000 units CAPS Take 1,000 Units by mouth daily.     . clopidogrel (PLAVIX) 75 MG tablet TAKE 1 TABLET BY MOUTH EVERY DAY 90 tablet 2  . CRESTOR 10 MG tablet TAKE 1 TABLET EVERY DAY 90 tablet 2  . ferrous sulfate 325 (65 FE) MG tablet Take 325 mg by mouth daily.     . furosemide (LASIX) 40 MG tablet Take 40 mg by mouth daily.    Marland Kitchen losartan (COZAAR) 25 MG tablet TAKE 1 TABLET BY MOUTH EVERY DAY 90 tablet 3  . metoprolol succinate (TOPROL-XL) 50 MG 24 hr tablet TAKE 1 TABLET EVERY DAY WITH OR IMMEDIATELY FOLLOWING A MEAL 90 tablet 2  . nitroGLYCERIN (NITROSTAT) 0.4 MG SL tablet Place 0.4 mg under the tongue every 5 (five) minutes as needed for chest pain.      No current facility-administered medications on file prior to visit.     Review of Systems Constitutional: Negative for activity change, appetite change, chills, diaphoresis, fatigue and fever.  HENT: Does not complaining of sore throat today Negative for postnasal drip and rhinorrhea.   Respiratory: Positive for cough and shortness of breath. Negative for apnea and chest tightness.        Mild SOB at times in therapy when she is walking Cardiovascular: Negative for chest pain, palpitations and leg swelling.  Gastrointestinal: Negative for abdominal distention, abdominal pain, constipation, diarrhea, nausea and vomiting.    Musculoskeletal-does complain at times of back pain she says this is intermitted says it is somewhat difficult to describe the nature of the pain does not really complain of numbness however or tingling.  Neurological: Positive for weakness. Negative for dizziness, tremors, syncope, light-headedness and headaches. Denies numbness Immunization History  Administered Date(s) Administered  . Influenza,inj,Quad PF,36+ Mos 09/12/2016  . Influenza-Unspecified 09/12/2014   Pertinent  Health Maintenance Due  Topic Date Due  . PNA vac Low Risk Adult (1 of 2 - PCV13) 09/10/2017 (Originally 12/28/1997)  . INFLUENZA VACCINE  Completed  . DEXA SCAN  Completed   No flowsheet data found. Functional Status Survey:    Vitals:   09/23/16 1622  BP: (!) 144/73  Pulse: 66  Resp: 20  Temp: 97.8 F (  36.6 C)  TempSrc: Oral  blood pressures appear somewhat variable 155/79-123/53 in this range Her weight is 212.8 pounds Physical Exam   Constitutional: She is oriented to person, place, and time. She appears well-developed and well-nourished. Sitting comfortably in her wheelchair Her skin is warm and dry-surgical site low back has well-healed crusting there is minimal tenderness to palpation no drainage bleeding or significant erythema HENT:  Head: Normocephalic.  Mouth/Throat: Oropharynx is clear and moist.  Cardiovascular: Normal rate, regular rhythm and normal heart sounds. Trace lower extremity edema   No murmur heard. Pulmonary/Chest: Effort normal and breath sounds normal. No respiratory distress. She has no wheezes.  Abdominal: Soft. Bowel sounds are normal. She exhibits no distension. There is no tenderness. There is no rebound and no guarding.  Musculoskeletal: She exhibits trace edema.  Neurological: She is alert and oriented to person, place, and time.  Good strength in all extremities. no lateralizing findings touch sensation is intact   Psychiatric: She has a normal mood and affect. Her  behavior is normal.   Labs reviewed:  Recent Labs  09/12/16 0215 09/12/16 0926 09/13/16 0526  NA 137 137 136  K 5.0 5.0 4.5  CL 104 107 107  CO2 25 23 20*  GLUCOSE 102* 95 107*  BUN 33* 36* 36*  CREATININE 2.27* 2.03* 1.66*  CALCIUM 8.6* 8.4* 8.7*    Recent Labs  08/22/16 1551  AST 17  ALT 12*  ALKPHOS 85  BILITOT 0.4  PROT 7.3  ALBUMIN 3.5    Recent Labs  07/08/16 1109 08/22/16 1551 09/08/16 1056  WBC 12.0* 14.2* 11.2*  NEUTROABS  --   --  8.0*  HGB 9.3* 9.4* 9.6*  HCT 31.2* 30.2* 30.6*  MCV 96.0 94.4 91.6  PLT 207 207 200   No results found for: TSH No results found for: HGBA1C No results found for: CHOL, HDL, LDLCALC, LDLDIRECT, TRIG, CHOLHDL  Significant Diagnostic Results in last 30 days:  Chest 2 View  Result Date: 09/08/2016 CLINICAL DATA:  Preoperative exam prior to lumbar spine surgery. The patient reports having a cold with cough for the past week. History of coronary artery disease with angioplasty, hypertension, previous MI and pulmonary embolism, [place] OPD. EXAM: CHEST  2 VIEW COMPARISON:  Chest x-ray and chest CT scan of August 22, 2016 FINDINGS: The lungs are well-expanded. The cardiac silhouette remains enlarged. The pulmonary interstitial markings are mildly prominent but have improved significantly since the previous study. There is no pulmonary vascular congestion. There is no pleural effusion. There is calcification in the wall of the aortic arch. There is multilevel degenerative disc disease of the thoracic spine. IMPRESSION: COPD. Cardiomegaly without pulmonary edema. No evidence of pneumonia or pleural effusion. Thoracic aortic atherosclerosis. Electronically Signed   By: David  Martinique M.D.   On: 09/08/2016 11:49   Dg Lumbar Spine 2-3 Views  Result Date: 09/10/2016 CLINICAL DATA:  L4-5 fixation. FLUOROSCOPY TIME:  63 seconds. Images: 2 EXAM: LUMBAR SPINE - 2-3 VIEW COMPARISON:  MRI November 03, 2015 FINDINGS: Pedicle rods and screws are  seen at L4-5. An IVC filter is identified. IMPRESSION: L4-5 pedicle rods and screws. Electronically Signed   By: Dorise Bullion III M.D   On: 09/10/2016 14:08   Dg C-arm 1-60 Min  Result Date: 09/10/2016 CLINICAL DATA:  L4-5 fixation EXAM: DG C-ARM 61-120 MIN COMPARISON:  MRI November 03, 2015 FINDINGS: L4-5 pedicle rods and screws have been placed. An IVC filter is identified. IMPRESSION: L4-5 pedicle rods and screws  as above. Electronically Signed   By: Dorise Bullion III M.D   On: 09/10/2016 14:12    Assessment/Plan  . Status post lumbar spinal fusion-he appears to be doing well-she will see neurosurgery tomorrow-she has at times complained of intermittent pain she is only receiving Tylenol- she does not do well with tramadol as noted above-we did discuss possible muscle relaxer or Vicodin-will defer to her neurosurgeon who she will see tomorrow-and she expressed agreement with this  #2 CHF with normal ejection fraction-she is on Lasix 40 mg a day-she has lost  weight actually admission weight was higher than her baseline of 221 pounds--- her admission weight was 227-most recent weight was 212-she does report somewhat of a diminished appetite-will have dietary take a look at this as well to see if there may be some more foods palatable for her .  #3 history of renal insufficiency apparently her creatinine was nearing baseline at 1.66 on lab done on December 4 will update this.  #4 history of mild leukocytosis this was trending down on discharge with white count of 11,200 on lab done November 29 will update this as well anemia appears to be stable at 9.6 will update this as well she is on iron.  [place], [place], [place]

## 2016-09-24 ENCOUNTER — Encounter (HOSPITAL_COMMUNITY)
Admission: RE | Admit: 2016-09-24 | Discharge: 2016-09-24 | Disposition: A | Discharge status: Home / Self Care | Payer: Medicare Other | Source: Skilled Nursing Facility | Attending: Internal Medicine | Admitting: Internal Medicine

## 2016-09-24 DIAGNOSIS — Z48811 Encounter for surgical aftercare following surgery on the nervous system: Secondary | ICD-10-CM | POA: Insufficient documentation

## 2016-09-24 DIAGNOSIS — D509 Iron deficiency anemia, unspecified: Secondary | ICD-10-CM | POA: Insufficient documentation

## 2016-09-24 DIAGNOSIS — Z79899 Other long term (current) drug therapy: Secondary | ICD-10-CM | POA: Insufficient documentation

## 2016-09-24 DIAGNOSIS — M4316 Spondylolisthesis, lumbar region: Secondary | ICD-10-CM | POA: Insufficient documentation

## 2016-09-24 DIAGNOSIS — M545 Low back pain: Secondary | ICD-10-CM | POA: Insufficient documentation

## 2016-09-24 LAB — CBC
HCT: 29.1 % — ABNORMAL LOW (ref 36.0–46.0)
Hemoglobin: 9.1 g/dL — ABNORMAL LOW (ref 12.0–15.0)
MCH: 29.2 pg (ref 26.0–34.0)
MCHC: 31.3 g/dL (ref 30.0–36.0)
MCV: 93.3 fL (ref 78.0–100.0)
PLATELETS: 263 10*3/uL (ref 150–400)
RBC: 3.12 MIL/uL — ABNORMAL LOW (ref 3.87–5.11)
RDW: 16.3 % — AB (ref 11.5–15.5)
WBC: 9.7 10*3/uL (ref 4.0–10.5)

## 2016-09-24 LAB — BASIC METABOLIC PANEL
Anion gap: 7 (ref 5–15)
BUN: 34 mg/dL — AB (ref 6–20)
CALCIUM: 8.9 mg/dL (ref 8.9–10.3)
CO2: 26 mmol/L (ref 22–32)
Chloride: 108 mmol/L (ref 101–111)
Creatinine, Ser: 1.45 mg/dL — ABNORMAL HIGH (ref 0.44–1.00)
GFR calc Af Amer: 37 mL/min — ABNORMAL LOW (ref >60)
GFR, EST NON AFRICAN AMERICAN: 32 mL/min — AB (ref >60)
GLUCOSE: 95 mg/dL (ref 65–99)
Potassium: 4 mmol/L (ref 3.5–5.1)
Sodium: 141 mmol/L (ref 135–145)

## 2016-09-27 ENCOUNTER — Encounter: Payer: Self-pay | Admitting: Internal Medicine

## 2016-09-27 ENCOUNTER — Non-Acute Institutional Stay (SKILLED_NURSING_FACILITY): Payer: Medicare Other | Admitting: Internal Medicine

## 2016-09-27 DIAGNOSIS — D649 Anemia, unspecified: Secondary | ICD-10-CM

## 2016-09-27 DIAGNOSIS — N183 Chronic kidney disease, stage 3 unspecified: Secondary | ICD-10-CM

## 2016-09-27 DIAGNOSIS — I10 Essential (primary) hypertension: Secondary | ICD-10-CM

## 2016-09-27 DIAGNOSIS — Z981 Arthrodesis status: Secondary | ICD-10-CM

## 2016-09-27 NOTE — Progress Notes (Signed)
Location:   [facility] Room Number: 144/P Place of Service:  SNF (31)  Provider: Granville Lewis  PCP: Maggie Font, MD Patient Care Team: [name], MD as PCP - General (Family Medicine) Satira Sark, MD as Consulting Physician (Cardiology) Eustace Moore, MD as Consulting Physician (Neurosurgery)  Extended Emergency Contact Information Primary Emergency Contact: Partee,Jeraldine Address: [address]          [place], [place] [address] Johnnette Litter of [place] Phone: [phone] Mobile Phone: [phone] Relation: Daughter  Code Status: Full Code Goals of care:  Advanced Directive information Advanced Directives 09/27/2016  Does Patient Have a Medical Advance Directive? Yes  Type of Advance Directive [place]  Does patient want to make changes to medical advance directive? No - Patient declined  Copy of [place] in Chart? -  Would patient like information on creating a medical advance directive? -     No Known Allergies  Chief Complaint  Patient presents with  . Discharge Note    HPI:  82 y.o. female  seen today for discharge from facility later this week. She does have a history of congestive heart failure hypertension chronic kidney disease hyperlipidemia coronary artery disease status post stent and history of recurrent PE and DVT status post IVC filter placement since she could not tolerate Coumadin.  Also history COPD.  She was here after failing conservative therapy for stenosis and underwent decompression and L4-L5 fusion on 09/10/2016.  Postop course was unremarkable.  She did have some worsening renal function however this has now returned essentially to her baseline with a creatinine of 1.45 on lab done on December 15.  She also had postop anemia hemoglobin on the 15th was 9.1 which actually is down slightly from previous  hemoglobin of 9.5-apparently at one point she did have  some bleeding from the surgical site which has resolved-Update hemoglobin today is 8.6-she is on iron  Regards congestive heart failure-she is on Lasix 40 mg a day her weight has actually has decreased about 10 pounds since her admission there edema appears to be at baseline she is not complaining of any increased shortness of breath beyond baseline this is more with exertion she is also on a beta blocker.  The regards to pain management she is only on Tylenol arthritis-I did see her recently for possible change in medication secondary to intermittent pain she was grade discuss this with her surgeon which she did see subsequently-her states she actually forgot to ask him about it-she still at times will complain of some increased pain although this is not persistent-she would like something a little stronger than the Tylenol she is receiving if needed.  She will be going home with her son-she will need a bedside commode rolling walker with seat secondary to her still continued weakness status post surgery also will need continued PT and OT for strengthening as well as nursing support for multiple medical issues.          Past Medical History:  Diagnosis Date  . CHF (congestive heart failure) ([facility])    ?  in ed 08/23/16  . Chronic anemia    takes Iron pill daily  . Chronic back pain    stenosis/spondylolisthesis  . COPD (chronic obstructive pulmonary disease) ([facility])   . Coronary atherosclerosis of native coronary artery    Occluded circ with collaterals and 90% RCA (previous stents at [facility], details not clear), LVEF 48% 1/11 -  inferior and inferolateral scar by Myoview 1/11,   . Gout    takes Allopurinol daily  . History of blood clots seeral yrs ago   abdomen  . History of blood transfusion    no abnormal reaction noted  . Hyperlipidemia    takes Crestor daily  . Hypertension    takes Losartan and Metoprolol daily  . Myocardial infarction 1997  . Nocturia   . Osteoarthritis   .  Peripheral edema    takes Lasix daily  . Pneumonia    hx of--f20+ yrs ago  . Pulmonary embolism (HCC)    History of recurrent DVT and PE status post IVC filter, previously on Coumadin    Past Surgical History:  Procedure Laterality Date  . ABDOMINAL HYSTERECTOMY  1965   partial  . APPENDECTOMY    . BACK SURGERY    . cataract surgery Bilateral   . CORONARY ANGIOPLASTY  1997   3 stents  . cyst removed from back  2006  . LAMINECTOMY WITH POSTERIOR LATERAL ARTHRODESIS LEVEL 1 N/A 09/10/2016   Procedure: Posterior Lateral Fusion - Lumbar four-Lumbar five, lumbar laminectomy and pedicle screw fixation  - Lumbar four-Lumbar five;  Surgeon: Eustace Moore, MD;  Location: [facility];  Service: Neurosurgery;  Laterality: N/A;  . Left adrenalectomy  2009  . Right breast biopsy  2008  . Right knee replacement  2011      reports that she has never smoked. She has never used smokeless tobacco. She reports that she does not drink alcohol or use drugs. Social History   Social History  . Marital status: Widowed    Spouse name: N/A  . Number of children: N/A  . Years of education: N/A   Occupational History  . Not on file.   Social History Main Topics  . Smoking status: Never Smoker  . Smokeless tobacco: Never Used  . Alcohol use No  . Drug use: No  . Sexual activity: Not on file   Other Topics Concern  . Not on file   Social History Narrative  . No narrative on file   Functional Status Survey:    No Known Allergies  Pertinent  Health Maintenance Due  Topic Date Due  . PNA vac Low Risk Adult (1 of 2 - PCV13) 09/10/2017 (Originally 12/28/1997)  . INFLUENZA VACCINE  Completed  . DEXA SCAN  Completed    Medications: Current Outpatient Prescriptions on File Prior to Visit  Medication Sig Dispense Refill  . acetaminophen (TYLENOL) 650 MG CR tablet Take 650 mg by mouth 2 (two) times daily.     Marland Kitchen ADVAIR DISKUS 250-50 MCG/DOSE AEPB Inhale 1 Inhaler into the lungs 2 (two) times daily.      Marland Kitchen allopurinol (ZYLOPRIM) 100 MG tablet Take 100 mg by mouth daily.    . Cholecalciferol (VITAMIN D3) 1000 units CAPS Take 1,000 Units by mouth daily.     . clopidogrel (PLAVIX) 75 MG tablet TAKE 1 TABLET BY MOUTH EVERY DAY 90 tablet 2  . CRESTOR 10 MG tablet TAKE 1 TABLET EVERY DAY 90 tablet 2  . ferrous sulfate 325 (65 FE) MG tablet Take 325 mg by mouth daily.     . furosemide (LASIX) 40 MG tablet Take 40 mg by mouth daily.    Marland Kitchen losartan (COZAAR) 25 MG tablet TAKE 1 TABLET BY MOUTH EVERY DAY 90 tablet 3  . metoprolol succinate (TOPROL-XL) 50 MG 24 hr tablet TAKE 1 TABLET EVERY DAY WITH OR IMMEDIATELY FOLLOWING A  MEAL 90 tablet 2  . nitroGLYCERIN (NITROSTAT) 0.4 MG SL tablet Place 0.4 mg under the tongue every 5 (five) minutes as needed for chest pain.      No current facility-administered medications on file prior to visit.     Review of Systems Constitutional: Negative for activity change, appetite change, chills, diaphoresis, fatigueand fever.  HENT: Does not complain of sore throat today Negative for postnasal dripand rhinorrhea.  Respiratory: . Negative for apneaand chest tightness. Does not complain of cough Mild SOB at times in therapy when she is walking Cardiovascular: Negative for chest pain, palpitationshas baseline lower extremity edema Gastrointestinal: Negative for abdominal distention, abdominal pain, constipation, diarrhea, nauseaand vomiting.   Musculoskeletal-does complain at times of back pain she says this is intermitted says it is somewhat difficult to describe the nature of the pain does not really complain of numbness however or tingling.  Neurological: Positive for weakness. Negative for dizziness, tremors, syncope, light-headednessand headaches. Denies numbness  Temperature 97.8 pulse 84 respirations 16 blood pressure 129/70 weight is 216 Physical Exam  Constitutional: She is oriented to person, place, and time. She appears well-developedand  well-nourished. Sitting comfortably in her wheelchair Her skin is warm and dry-surgical site low back has well-healed crusting there is minimal tenderness to palpation no drainage bleeding or significant erythema HENT:  Head: Normocephalic.  Mouth/Throat: Oropharynx is clear and moist.  Cardiovascular: Normal rate, regular rhythmand normal heart sounds. Trace lower extremity edema -pedal pulses are intact No murmurheard. Pulmonary/Chest: Effort normaland breath sounds normal. No respiratory distress. She has no wheezes.  Abdominal: Soft. Bowel sounds are normal. She exhibits no distension. There is no tenderness. There is no reboundand no guarding.  Musculoskeletal: She exhibits trace edema.  Neurological: She is alertand oriented to person, place, and time.  Good strength in all extremities. no lateralizing findings touch sensation is intact--she is able to stand without assistance and ambulate with walker still has some weakness however  Psychiatric: She has a normal mood and affect. Her behavior is normal.  Labs reviewed: Basic Metabolic Panel:  Recent Labs  09/12/16 0926 09/13/16 0526 09/24/16 0700  NA 137 136 141  K 5.0 4.5 4.0  CL 107 107 108  CO2 23 20* 26  GLUCOSE 95 107* 95  BUN 36* 36* 34*  CREATININE 2.03* 1.66* 1.45*  CALCIUM 8.4* 8.7* 8.9   Liver Function Tests:  Recent Labs  08/22/16 1551  AST 17  ALT 12*  ALKPHOS 85  BILITOT 0.4  PROT 7.3  ALBUMIN 3.5   No results for input(s): LIPASE, AMYLASE in the last 8760 hours. No results for input(s): AMMONIA in the last 8760 hours. CBC:  Recent Labs  08/22/16 1551 09/08/16 1056 09/24/16 0700  WBC 14.2* 11.2* 9.7  NEUTROABS  --  8.0*  --   HGB 9.4* 9.6* 9.1*  HCT 30.2* 30.6* 29.1*  MCV 94.4 91.6 93.3  PLT 207 200 263   Cardiac Enzymes: No results for input(s): CKTOTAL, CKMB, CKMBINDEX, TROPONINI in the last 8760 hours. BNP: Invalid input(s): POCBNP CBG: No results for input(s): GLUCAP in the  last 8760 hours.  Procedures and Imaging Studies During Stay: Chest 2 View  Result Date: 09/08/2016 CLINICAL DATA:  Preoperative exam prior to lumbar spine surgery. The patient reports having a cold with cough for the past week. History of coronary artery disease with angioplasty, hypertension, previous MI and pulmonary embolism, [place] OPD. EXAM: CHEST  2 VIEW COMPARISON:  Chest x-ray and chest CT scan of August 22, 2016 FINDINGS: The lungs are well-expanded. The cardiac silhouette remains enlarged. The pulmonary interstitial markings are mildly prominent but have improved significantly since the previous study. There is no pulmonary vascular congestion. There is no pleural effusion. There is calcification in the wall of the aortic arch. There is multilevel degenerative disc disease of the thoracic spine. IMPRESSION: COPD. Cardiomegaly without pulmonary edema. No evidence of pneumonia or pleural effusion. Thoracic aortic atherosclerosis. Electronically Signed   By: David  Martinique M.D.   On: 09/08/2016 11:49   Dg Lumbar Spine 2-3 Views  Result Date: 09/10/2016 CLINICAL DATA:  L4-5 fixation. FLUOROSCOPY TIME:  63 seconds. Images: 2 EXAM: LUMBAR SPINE - 2-3 VIEW COMPARISON:  MRI November 03, 2015 FINDINGS: Pedicle rods and screws are seen at L4-5. An IVC filter is identified. IMPRESSION: L4-5 pedicle rods and screws. Electronically Signed   By: Dorise Bullion III M.D   On: 09/10/2016 14:08   Dg C-arm 1-60 Min  Result Date: 09/10/2016 CLINICAL DATA:  L4-5 fixation EXAM: DG C-ARM 61-120 MIN COMPARISON:  MRI November 03, 2015 FINDINGS: L4-5 pedicle rods and screws have been placed. An IVC filter is identified. IMPRESSION: L4-5 pedicle rods and screws as above. Electronically Signed   By: Dorise Bullion III M.D   On: 09/10/2016 14:12    Assessment/Plan:    Status post lumbar spinal fusion-- Appears to be doing well but would benefit from continued therapy He will need PT and OT-. She is on low-dose  Vicodin for pain management    #2 CHF with normal ejection fraction-she is on Lasix 40 mg a day-she has lost  weight actually admission weight was higher than her baseline of 221 pounds--- her admission weight was 227-most recent weight was 216-s There has been some variability but clinically appears to be stable-will need follow-up by primary care provider .  #3 history of renal insufficiency her creatinine appears to be relatively baseline now at 1.45 on lab done on December 15  #4 history of mild leukocytosis this was trending down on discharge with white count of 11,200 on lab done November 29  . This normalized on lab done 09/24/2016 with a white count of 9.7. On today's lab is 8.9  #5 anemia-hemoglobin actually went down a bit from 9.1 down from 9.5 previous and now on lab done today 8.6-she is on iron-this will need updating expediently upon discharge with follow-up by primary care provider as warranted   #6-history of hypertension this appears fairly stable she is on Toprol as well as losartan-I do not see consistent elevations.  #7 history of hyperlipidemia she is on Crestor will have follow-up by primary care provider would not aggressive pursuing a lipid panel secondary to her short stay here.  #8 history of gout this was asymptomatic during her stay here she is on allopurinol  . #9 coronary artery disease this is been relatively asymptomatic she is on Plavix as well as a beta blocker And a statin  #10 history COPD this is been relatively asymptomatic she is on Advair says her shortness of breath on exertion at times is chronic   Again she will be going home with her son will need continued PT OT as well as nursing support will need a bedside commode and rolling walker to help with ambulation-hemoglobin is now 8.6 this will need to be  updated this will need follow as well as primary care provider.  [id number] note greater than 30 minutes spent on this discharge  summary-greater than 50%  of time spent coordinating plan of care for numerous diagnoses.       Patient is being discharged with the following home health services:  PT and OT for further strengthening as well as nursing support for multiple medical issues  Patient is being discharged with the following durable medical equipment: Bedside commode as well as rolling walker with seat-   Patient has been advised to f/u with their PCP in 1-2 weeks to bring them up to date on their rehab stay.  Social services at facility was responsible for arranging this appointment.  Pt was provided with a 30 day supply of prescriptions for medications and refills must be obtained from their PCP.  For controlled substances, a more limited supply may be provided adequate until PCP appointment only.  Future labs/tests needed:

## 2016-09-28 ENCOUNTER — Non-Acute Institutional Stay (SKILLED_NURSING_FACILITY): Payer: Medicare Other | Admitting: Internal Medicine

## 2016-09-28 ENCOUNTER — Encounter: Payer: Self-pay | Admitting: Internal Medicine

## 2016-09-28 ENCOUNTER — Encounter (HOSPITAL_COMMUNITY)
Admission: RE | Admit: 2016-09-28 | Discharge: 2016-09-28 | Disposition: A | Discharge status: Home / Self Care | Payer: Medicare Other | Source: Skilled Nursing Facility | Attending: Internal Medicine | Admitting: Internal Medicine

## 2016-09-28 DIAGNOSIS — D649 Anemia, unspecified: Secondary | ICD-10-CM | POA: Diagnosis not present

## 2016-09-28 LAB — CBC
HCT: 27.7 % — ABNORMAL LOW (ref 36.0–46.0)
Hemoglobin: 8.6 g/dL — ABNORMAL LOW (ref 12.0–15.0)
MCH: 28.8 pg (ref 26.0–34.0)
MCHC: 31 g/dL (ref 30.0–36.0)
MCV: 92.6 fL (ref 78.0–100.0)
PLATELETS: 245 10*3/uL (ref 150–400)
RBC: 2.99 MIL/uL — ABNORMAL LOW (ref 3.87–5.11)
RDW: 15.8 % — AB (ref 11.5–15.5)
WBC: 8.9 10*3/uL (ref 4.0–10.5)

## 2016-09-28 NOTE — Progress Notes (Signed)
Location:   [employer] Room Number: 144/P Place of Service:  SNF [phone]) Provider:  Orpah Melter, MD  Patient Care Team: [name], MD as PCP - General (Family Medicine) Satira Sark, MD as Consulting Physician (Cardiology) Eustace Moore, MD as Consulting Physician (Neurosurgery)  Extended Emergency Contact Information Primary Emergency Contact: Partee,Jeraldine Address: [address]          [place], [place] [address] [place] of [place] Phone: [phone] Mobile Phone: [phone] Relation: Daughter  Code Status:  Full Code Goals of care: Advanced Directive information Advanced Directives 09/28/2016  Does Patient Have a Medical Advance Directive? Yes  Type of Advance Directive (No Data)  Does patient want to make changes to medical advance directive? No - Patient declined  Copy of [place] in Chart? -  Would patient like information on creating a medical advance directive? -     Chief Complaint  Patient presents with  . Acute Visit    Anemia      HPI:  Pt is a 82 y.o. female seen today for an acute visit forAnemia.  Lab work today showed a hemoglobin of 8.6-this appears to be slightly down from her baseline which recently has been in the nines-  She is on iron status post decompression and L4-L5 fusion on 09/10/2016.  She was seen yesterday for discharge and thought to be doing well but we did update her lab work including a hemoglobin since her hemoglobin 9.1 was down slightly from previous hemoglobin of 9.6  I have reviewed her previous history and she denies any history of GI bleed rectal bleeding-I do note she did receive a transfusion back in May 2011 after a knee replacement   It appears recent hemoglobins before surgery were largely in the nines.  She denies any increase shortness of breath or weakness beyond baseline she is looking forward to going home tomorrow    Past  Medical History:  Diagnosis Date  . CHF (congestive heart failure) ([place])    ?  in ed 08/23/16  . Chronic anemia    takes Iron pill daily  . Chronic back pain    stenosis/spondylolisthesis  . COPD (chronic obstructive pulmonary disease) ([place])   . Coronary atherosclerosis of native coronary artery    Occluded circ with collaterals and 90% RCA (previous stents at [facility], details not clear), LVEF 48% 1/11 - inferior and inferolateral scar by Myoview 1/11,   . Gout    takes Allopurinol daily  . History of blood clots seeral yrs ago   abdomen  . History of blood transfusion    no abnormal reaction noted  . Hyperlipidemia    takes Crestor daily  . Hypertension    takes Losartan and Metoprolol daily  . Myocardial infarction 1997  . Nocturia   . Osteoarthritis   . Peripheral edema    takes Lasix daily  . Pneumonia    hx of--f20+ yrs ago  . Pulmonary embolism (HCC)    History of recurrent DVT and PE status post IVC filter, previously on Coumadin   Past Surgical History:  Procedure Laterality Date  . ABDOMINAL HYSTERECTOMY  1965   partial  . APPENDECTOMY    . BACK SURGERY    . cataract surgery Bilateral   . CORONARY ANGIOPLASTY  1997   3 stents  . cyst removed from back  2006  . LAMINECTOMY WITH POSTERIOR LATERAL ARTHRODESIS LEVEL 1 N/A 09/10/2016  Procedure: Posterior Lateral Fusion - Lumbar four-Lumbar five, lumbar laminectomy and pedicle screw fixation  - Lumbar four-Lumbar five;  Surgeon: Eustace Moore, MD;  Location: [place];  Service: Neurosurgery;  Laterality: N/A;  . Left adrenalectomy  2009  . Right breast biopsy  2008  . Right knee replacement  2011    No Known Allergies  Current Outpatient Prescriptions on File Prior to Visit  Medication Sig Dispense Refill  . acetaminophen (TYLENOL) 650 MG CR tablet Take 650 mg by mouth 2 (two) times daily.     Marland Kitchen ADVAIR DISKUS 250-50 MCG/DOSE AEPB Inhale 1 Inhaler into the lungs 2 (two) times daily.     Marland Kitchen allopurinol (ZYLOPRIM) 100  MG tablet Take 100 mg by mouth daily.    . Cholecalciferol (VITAMIN D3) 1000 units CAPS Take 1,000 Units by mouth daily.     . clopidogrel (PLAVIX) 75 MG tablet TAKE 1 TABLET BY MOUTH EVERY DAY 90 tablet 2  . CRESTOR 10 MG tablet TAKE 1 TABLET EVERY DAY 90 tablet 2  . ferrous sulfate 325 (65 FE) MG tablet Take 325 mg by mouth daily.     . furosemide (LASIX) 40 MG tablet Take 40 mg by mouth daily.    Marland Kitchen losartan (COZAAR) 25 MG tablet TAKE 1 TABLET BY MOUTH EVERY DAY 90 tablet 3  . metoprolol succinate (TOPROL-XL) 50 MG 24 hr tablet TAKE 1 TABLET EVERY DAY WITH OR IMMEDIATELY FOLLOWING A MEAL 90 tablet 2  . nitroGLYCERIN (NITROSTAT) 0.4 MG SL tablet Place 0.4 mg under the tongue every 5 (five) minutes as needed for chest pain.      No current facility-administered medications on file prior to visit.      Review of Systems   Constitutional: Negative for activity change, appetite change, chills, diaphoresis, fatigueand fever.  HENT: Negative for postnasal dripand rhinorrhea.  Respiratory: . Negative for apneaand chest tightness. Does not complain of cough Mild SOB at times in therapy when she is walking Cardiovascular: Negative for chest pain, palpitationshas baseline lower extremity edema Gastrointestinal: Negative for abdominal distention, abdominal pain, constipation, diarrhea, nauseaand vomiting.   Musculoskeletal-does complain at times of back pain she says this is intermitted says it is somewhat difficult to describe the nature of the pain does not really complain of numbness however or tingling.  Neurological: Positive for weakness but this has not increased recently-has had some weakness since her surgery she states. Negative for dizziness, tremors, syncope, light-headednessand headaches. Denies numbness   Immunization History  Administered Date(s) Administered  . Influenza,inj,Quad PF,36+ Mos 09/12/2016  . Influenza-Unspecified 09/12/2014   Pertinent  Health  Maintenance Due  Topic Date Due  . PNA vac Low Risk Adult (1 of 2 - PCV13) 09/10/2017 (Originally 12/28/1997)  . INFLUENZA VACCINE  Completed  . DEXA SCAN  Completed   No flowsheet data found. Functional Status Survey:    Vitals:   09/28/16 1204  BP: 124/70  Pulse: 70  Resp: 20  Temp: 97.8 F (36.6 C)  TempSrc: Oral    Physical Exam Constitutional: She is oriented to person, place, and time. She appears well-developedand well-nourished. Sitting comfortably in her wheelchair Her skin is warm and dry-surgical site low back has well-healed crusting there is minimal tenderness to palpation no drainage bleeding or significant erythema HENT:  Head: Normocephalic.  Mouth/Throat: Oropharynx is clear and moist.  Cardiovascular: Normal rate, regular rhythmand normal heart sounds. Trace lower extremity edema  No murmurheard. Pulmonary/Chest: Effort normaland breath sounds normal. No respiratory distress.  She has no wheezes.  Abdominal: Soft. Bowel sounds are normal. She exhibits no distension. There is no tenderness. There is no reboundand no guarding. Her abdomen is obese  Musculoskeletal: She exhibits traceedema.  Neurological: She is alertand oriented to person, place, and time.  Good strength in all extremities.no lateralizing findings touch sensation is intact--  Psychiatric: She has a normal mood and affect. Her behavior is normal.   Labs reviewed: 09/28/2016.  WBC 8.9 hemoglobin 8.6 platelets 245   Recent Labs  09/12/16 0926 09/13/16 0526 09/24/16 0700  NA 137 136 141  K 5.0 4.5 4.0  CL 107 107 108  CO2 23 20* 26  GLUCOSE 95 107* 95  BUN 36* 36* 34*  CREATININE 2.03* 1.66* 1.45*  CALCIUM 8.4* 8.7* 8.9    Recent Labs  08/22/16 1551  AST 17  ALT 12*  ALKPHOS 85  BILITOT 0.4  PROT 7.3  ALBUMIN 3.5    Recent Labs  09/08/16 1056 09/24/16 0700 09/28/16 0715  WBC 11.2* 9.7 8.9  NEUTROABS 8.0*  --   --   HGB 9.6* 9.1* 8.6*  HCT 30.6* 29.1*  27.7*  MCV 91.6 93.3 92.6  PLT 200 263 245   No results found for: TSH No results found for: HGBA1C No results found for: CHOL, HDL, LDLCALC, LDLDIRECT, TRIG, CHOLHDL  Significant Diagnostic Results in last 30 days:  Chest 2 View  Result Date: 09/08/2016 CLINICAL DATA:  Preoperative exam prior to lumbar spine surgery. The patient reports having a cold with cough for the past week. History of coronary artery disease with angioplasty, hypertension, previous MI and pulmonary embolism, [place] OPD. EXAM: CHEST  2 VIEW COMPARISON:  Chest x-ray and chest CT scan of August 22, 2016 FINDINGS: The lungs are well-expanded. The cardiac silhouette remains enlarged. The pulmonary interstitial markings are mildly prominent but have improved significantly since the previous study. There is no pulmonary vascular congestion. There is no pleural effusion. There is calcification in the wall of the aortic arch. There is multilevel degenerative disc disease of the thoracic spine. IMPRESSION: COPD. Cardiomegaly without pulmonary edema. No evidence of pneumonia or pleural effusion. Thoracic aortic atherosclerosis. Electronically Signed   By: David  Martinique M.D.   On: 09/08/2016 11:49   Dg Lumbar Spine 2-3 Views  Result Date: 09/10/2016 CLINICAL DATA:  L4-5 fixation. FLUOROSCOPY TIME:  63 seconds. Images: 2 EXAM: LUMBAR SPINE - 2-3 VIEW COMPARISON:  MRI November 03, 2015 FINDINGS: Pedicle rods and screws are seen at L4-5. An IVC filter is identified. IMPRESSION: L4-5 pedicle rods and screws. Electronically Signed   By: Dorise Bullion III M.D   On: 09/10/2016 14:08   Dg C-arm 1-60 Min  Result Date: 09/10/2016 CLINICAL DATA:  L4-5 fixation EXAM: DG C-ARM 61-120 MIN COMPARISON:  MRI November 03, 2015 FINDINGS: L4-5 pedicle rods and screws have been placed. An IVC filter is identified. IMPRESSION: L4-5 pedicle rods and screws as above. Electronically Signed   By: Dorise Bullion III M.D   On: 09/10/2016 14:12     Assessment/Plan  Anemia-hemoglobin has slowly trended down from 9.6 down to 9.1 now 8.6.  She is on iron-appears her baseline recently has been in the nines per chart review  We did perform guaiac stool testing it was negative.   At this point again will need updated lab work later this week expediently as well as follow up with primary care provider  Again we did perform guaiac stool testing which was negative which was reassuring  [place], [place], [place]

## 2016-09-29 ENCOUNTER — Encounter (HOSPITAL_COMMUNITY)
Admission: RE | Admit: 2016-09-29 | Discharge: 2016-09-29 | Disposition: A | Discharge status: Home / Self Care | Payer: Medicare Other | Source: Skilled Nursing Facility | Attending: Internal Medicine | Admitting: Internal Medicine

## 2016-09-29 LAB — CBC
HEMATOCRIT: 28.5 % — AB (ref 36.0–46.0)
Hemoglobin: 9 g/dL — ABNORMAL LOW (ref 12.0–15.0)
MCH: 29.4 pg (ref 26.0–34.0)
MCHC: 31.6 g/dL (ref 30.0–36.0)
MCV: 93.1 fL (ref 78.0–100.0)
Platelets: 222 10*3/uL (ref 150–400)
RBC: 3.06 MIL/uL — AB (ref 3.87–5.11)
RDW: 16.7 % — ABNORMAL HIGH (ref 11.5–15.5)
WBC: 8.4 10*3/uL (ref 4.0–10.5)

## 2016-10-02 DIAGNOSIS — J449 Chronic obstructive pulmonary disease, unspecified: Secondary | ICD-10-CM | POA: Diagnosis not present

## 2016-10-02 DIAGNOSIS — E785 Hyperlipidemia, unspecified: Secondary | ICD-10-CM | POA: Diagnosis not present

## 2016-10-02 DIAGNOSIS — Z86711 Personal history of pulmonary embolism: Secondary | ICD-10-CM | POA: Diagnosis not present

## 2016-10-02 DIAGNOSIS — Z7902 Long term (current) use of antithrombotics/antiplatelets: Secondary | ICD-10-CM | POA: Diagnosis not present

## 2016-10-02 DIAGNOSIS — Z4789 Encounter for other orthopedic aftercare: Secondary | ICD-10-CM | POA: Diagnosis not present

## 2016-10-02 DIAGNOSIS — M48061 Spinal stenosis, lumbar region without neurogenic claudication: Secondary | ICD-10-CM | POA: Diagnosis not present

## 2016-10-02 DIAGNOSIS — I110 Hypertensive heart disease with heart failure: Secondary | ICD-10-CM | POA: Diagnosis not present

## 2016-10-02 DIAGNOSIS — I509 Heart failure, unspecified: Secondary | ICD-10-CM | POA: Diagnosis not present

## 2016-10-02 DIAGNOSIS — I2510 Atherosclerotic heart disease of native coronary artery without angina pectoris: Secondary | ICD-10-CM | POA: Diagnosis not present

## 2016-10-02 DIAGNOSIS — Z7951 Long term (current) use of inhaled steroids: Secondary | ICD-10-CM | POA: Diagnosis not present

## 2016-10-02 DIAGNOSIS — M4316 Spondylolisthesis, lumbar region: Secondary | ICD-10-CM | POA: Diagnosis not present

## 2016-10-05 ENCOUNTER — Encounter (HOSPITAL_COMMUNITY)
Admission: RE | Admit: 2016-10-05 | Discharge: 2016-10-05 | Disposition: A | Discharge status: Home / Self Care | Payer: Medicare Other | Source: Skilled Nursing Facility | Attending: Internal Medicine | Admitting: Internal Medicine

## 2016-10-05 DIAGNOSIS — Z86711 Personal history of pulmonary embolism: Secondary | ICD-10-CM | POA: Diagnosis not present

## 2016-10-05 DIAGNOSIS — E785 Hyperlipidemia, unspecified: Secondary | ICD-10-CM | POA: Diagnosis not present

## 2016-10-05 DIAGNOSIS — Z9889 Other specified postprocedural states: Secondary | ICD-10-CM | POA: Diagnosis not present

## 2016-10-05 DIAGNOSIS — M48061 Spinal stenosis, lumbar region without neurogenic claudication: Secondary | ICD-10-CM | POA: Diagnosis not present

## 2016-10-05 DIAGNOSIS — J449 Chronic obstructive pulmonary disease, unspecified: Secondary | ICD-10-CM | POA: Diagnosis not present

## 2016-10-05 DIAGNOSIS — Z7951 Long term (current) use of inhaled steroids: Secondary | ICD-10-CM | POA: Diagnosis not present

## 2016-10-05 DIAGNOSIS — I2510 Atherosclerotic heart disease of native coronary artery without angina pectoris: Secondary | ICD-10-CM | POA: Diagnosis not present

## 2016-10-05 DIAGNOSIS — M4316 Spondylolisthesis, lumbar region: Secondary | ICD-10-CM | POA: Diagnosis not present

## 2016-10-05 DIAGNOSIS — I110 Hypertensive heart disease with heart failure: Secondary | ICD-10-CM | POA: Diagnosis not present

## 2016-10-05 DIAGNOSIS — Z7902 Long term (current) use of antithrombotics/antiplatelets: Secondary | ICD-10-CM | POA: Diagnosis not present

## 2016-10-05 DIAGNOSIS — I509 Heart failure, unspecified: Secondary | ICD-10-CM | POA: Diagnosis not present

## 2016-10-05 DIAGNOSIS — Z4789 Encounter for other orthopedic aftercare: Secondary | ICD-10-CM | POA: Diagnosis not present

## 2016-10-06 DIAGNOSIS — I509 Heart failure, unspecified: Secondary | ICD-10-CM | POA: Diagnosis not present

## 2016-10-06 DIAGNOSIS — Z4789 Encounter for other orthopedic aftercare: Secondary | ICD-10-CM | POA: Diagnosis not present

## 2016-10-06 DIAGNOSIS — Z86711 Personal history of pulmonary embolism: Secondary | ICD-10-CM | POA: Diagnosis not present

## 2016-10-06 DIAGNOSIS — I2510 Atherosclerotic heart disease of native coronary artery without angina pectoris: Secondary | ICD-10-CM | POA: Diagnosis not present

## 2016-10-06 DIAGNOSIS — Z7951 Long term (current) use of inhaled steroids: Secondary | ICD-10-CM | POA: Diagnosis not present

## 2016-10-06 DIAGNOSIS — M4316 Spondylolisthesis, lumbar region: Secondary | ICD-10-CM | POA: Diagnosis not present

## 2016-10-06 DIAGNOSIS — I110 Hypertensive heart disease with heart failure: Secondary | ICD-10-CM | POA: Diagnosis not present

## 2016-10-06 DIAGNOSIS — M48061 Spinal stenosis, lumbar region without neurogenic claudication: Secondary | ICD-10-CM | POA: Diagnosis not present

## 2016-10-06 DIAGNOSIS — Z7902 Long term (current) use of antithrombotics/antiplatelets: Secondary | ICD-10-CM | POA: Diagnosis not present

## 2016-10-06 DIAGNOSIS — J449 Chronic obstructive pulmonary disease, unspecified: Secondary | ICD-10-CM | POA: Diagnosis not present

## 2016-10-06 DIAGNOSIS — E785 Hyperlipidemia, unspecified: Secondary | ICD-10-CM | POA: Diagnosis not present

## 2016-10-08 DIAGNOSIS — Z7902 Long term (current) use of antithrombotics/antiplatelets: Secondary | ICD-10-CM | POA: Diagnosis not present

## 2016-10-08 DIAGNOSIS — M48061 Spinal stenosis, lumbar region without neurogenic claudication: Secondary | ICD-10-CM | POA: Diagnosis not present

## 2016-10-08 DIAGNOSIS — Z86711 Personal history of pulmonary embolism: Secondary | ICD-10-CM | POA: Diagnosis not present

## 2016-10-08 DIAGNOSIS — I2510 Atherosclerotic heart disease of native coronary artery without angina pectoris: Secondary | ICD-10-CM | POA: Diagnosis not present

## 2016-10-08 DIAGNOSIS — E785 Hyperlipidemia, unspecified: Secondary | ICD-10-CM | POA: Diagnosis not present

## 2016-10-08 DIAGNOSIS — Z4789 Encounter for other orthopedic aftercare: Secondary | ICD-10-CM | POA: Diagnosis not present

## 2016-10-08 DIAGNOSIS — J449 Chronic obstructive pulmonary disease, unspecified: Secondary | ICD-10-CM | POA: Diagnosis not present

## 2016-10-08 DIAGNOSIS — Z7951 Long term (current) use of inhaled steroids: Secondary | ICD-10-CM | POA: Diagnosis not present

## 2016-10-08 DIAGNOSIS — M4316 Spondylolisthesis, lumbar region: Secondary | ICD-10-CM | POA: Diagnosis not present

## 2016-10-08 DIAGNOSIS — I509 Heart failure, unspecified: Secondary | ICD-10-CM | POA: Diagnosis not present

## 2016-10-08 DIAGNOSIS — I110 Hypertensive heart disease with heart failure: Secondary | ICD-10-CM | POA: Diagnosis not present

## 2016-10-11 DIAGNOSIS — M48061 Spinal stenosis, lumbar region without neurogenic claudication: Secondary | ICD-10-CM | POA: Diagnosis not present

## 2016-10-11 DIAGNOSIS — Z7902 Long term (current) use of antithrombotics/antiplatelets: Secondary | ICD-10-CM | POA: Diagnosis not present

## 2016-10-11 DIAGNOSIS — I509 Heart failure, unspecified: Secondary | ICD-10-CM | POA: Diagnosis not present

## 2016-10-11 DIAGNOSIS — I2510 Atherosclerotic heart disease of native coronary artery without angina pectoris: Secondary | ICD-10-CM | POA: Diagnosis not present

## 2016-10-11 DIAGNOSIS — Z86711 Personal history of pulmonary embolism: Secondary | ICD-10-CM | POA: Diagnosis not present

## 2016-10-11 DIAGNOSIS — E785 Hyperlipidemia, unspecified: Secondary | ICD-10-CM | POA: Diagnosis not present

## 2016-10-11 DIAGNOSIS — M4316 Spondylolisthesis, lumbar region: Secondary | ICD-10-CM | POA: Diagnosis not present

## 2016-10-11 DIAGNOSIS — J449 Chronic obstructive pulmonary disease, unspecified: Secondary | ICD-10-CM | POA: Diagnosis not present

## 2016-10-11 DIAGNOSIS — Z4789 Encounter for other orthopedic aftercare: Secondary | ICD-10-CM | POA: Diagnosis not present

## 2016-10-11 DIAGNOSIS — I110 Hypertensive heart disease with heart failure: Secondary | ICD-10-CM | POA: Diagnosis not present

## 2016-10-11 DIAGNOSIS — Z7951 Long term (current) use of inhaled steroids: Secondary | ICD-10-CM | POA: Diagnosis not present

## 2016-10-12 DIAGNOSIS — Z86711 Personal history of pulmonary embolism: Secondary | ICD-10-CM | POA: Diagnosis not present

## 2016-10-12 DIAGNOSIS — I2510 Atherosclerotic heart disease of native coronary artery without angina pectoris: Secondary | ICD-10-CM | POA: Diagnosis not present

## 2016-10-12 DIAGNOSIS — Z7902 Long term (current) use of antithrombotics/antiplatelets: Secondary | ICD-10-CM | POA: Diagnosis not present

## 2016-10-12 DIAGNOSIS — M4316 Spondylolisthesis, lumbar region: Secondary | ICD-10-CM | POA: Diagnosis not present

## 2016-10-12 DIAGNOSIS — Z7951 Long term (current) use of inhaled steroids: Secondary | ICD-10-CM | POA: Diagnosis not present

## 2016-10-12 DIAGNOSIS — I110 Hypertensive heart disease with heart failure: Secondary | ICD-10-CM | POA: Diagnosis not present

## 2016-10-12 DIAGNOSIS — J449 Chronic obstructive pulmonary disease, unspecified: Secondary | ICD-10-CM | POA: Diagnosis not present

## 2016-10-12 DIAGNOSIS — E785 Hyperlipidemia, unspecified: Secondary | ICD-10-CM | POA: Diagnosis not present

## 2016-10-12 DIAGNOSIS — I509 Heart failure, unspecified: Secondary | ICD-10-CM | POA: Diagnosis not present

## 2016-10-12 DIAGNOSIS — Z4789 Encounter for other orthopedic aftercare: Secondary | ICD-10-CM | POA: Diagnosis not present

## 2016-10-12 DIAGNOSIS — M48061 Spinal stenosis, lumbar region without neurogenic claudication: Secondary | ICD-10-CM | POA: Diagnosis not present

## 2016-10-13 DIAGNOSIS — E785 Hyperlipidemia, unspecified: Secondary | ICD-10-CM | POA: Diagnosis not present

## 2016-10-13 DIAGNOSIS — I10 Essential (primary) hypertension: Secondary | ICD-10-CM | POA: Diagnosis not present

## 2016-10-13 DIAGNOSIS — E538 Deficiency of other specified B group vitamins: Secondary | ICD-10-CM | POA: Diagnosis not present

## 2016-10-13 DIAGNOSIS — D649 Anemia, unspecified: Secondary | ICD-10-CM | POA: Diagnosis not present

## 2016-10-14 DIAGNOSIS — Z86711 Personal history of pulmonary embolism: Secondary | ICD-10-CM | POA: Diagnosis not present

## 2016-10-14 DIAGNOSIS — Z7902 Long term (current) use of antithrombotics/antiplatelets: Secondary | ICD-10-CM | POA: Diagnosis not present

## 2016-10-14 DIAGNOSIS — E785 Hyperlipidemia, unspecified: Secondary | ICD-10-CM | POA: Diagnosis not present

## 2016-10-14 DIAGNOSIS — J449 Chronic obstructive pulmonary disease, unspecified: Secondary | ICD-10-CM | POA: Diagnosis not present

## 2016-10-14 DIAGNOSIS — Z7951 Long term (current) use of inhaled steroids: Secondary | ICD-10-CM | POA: Diagnosis not present

## 2016-10-14 DIAGNOSIS — I509 Heart failure, unspecified: Secondary | ICD-10-CM | POA: Diagnosis not present

## 2016-10-14 DIAGNOSIS — M4316 Spondylolisthesis, lumbar region: Secondary | ICD-10-CM | POA: Diagnosis not present

## 2016-10-14 DIAGNOSIS — I110 Hypertensive heart disease with heart failure: Secondary | ICD-10-CM | POA: Diagnosis not present

## 2016-10-14 DIAGNOSIS — I2510 Atherosclerotic heart disease of native coronary artery without angina pectoris: Secondary | ICD-10-CM | POA: Diagnosis not present

## 2016-10-14 DIAGNOSIS — Z4789 Encounter for other orthopedic aftercare: Secondary | ICD-10-CM | POA: Diagnosis not present

## 2016-10-14 DIAGNOSIS — M48061 Spinal stenosis, lumbar region without neurogenic claudication: Secondary | ICD-10-CM | POA: Diagnosis not present

## 2016-10-15 DIAGNOSIS — Z4789 Encounter for other orthopedic aftercare: Secondary | ICD-10-CM | POA: Diagnosis not present

## 2016-10-15 DIAGNOSIS — Z86711 Personal history of pulmonary embolism: Secondary | ICD-10-CM | POA: Diagnosis not present

## 2016-10-15 DIAGNOSIS — I2510 Atherosclerotic heart disease of native coronary artery without angina pectoris: Secondary | ICD-10-CM | POA: Diagnosis not present

## 2016-10-15 DIAGNOSIS — J449 Chronic obstructive pulmonary disease, unspecified: Secondary | ICD-10-CM | POA: Diagnosis not present

## 2016-10-15 DIAGNOSIS — Z7951 Long term (current) use of inhaled steroids: Secondary | ICD-10-CM | POA: Diagnosis not present

## 2016-10-15 DIAGNOSIS — M48061 Spinal stenosis, lumbar region without neurogenic claudication: Secondary | ICD-10-CM | POA: Diagnosis not present

## 2016-10-15 DIAGNOSIS — I110 Hypertensive heart disease with heart failure: Secondary | ICD-10-CM | POA: Diagnosis not present

## 2016-10-15 DIAGNOSIS — E785 Hyperlipidemia, unspecified: Secondary | ICD-10-CM | POA: Diagnosis not present

## 2016-10-15 DIAGNOSIS — M4316 Spondylolisthesis, lumbar region: Secondary | ICD-10-CM | POA: Diagnosis not present

## 2016-10-15 DIAGNOSIS — I509 Heart failure, unspecified: Secondary | ICD-10-CM | POA: Diagnosis not present

## 2016-10-15 DIAGNOSIS — Z7902 Long term (current) use of antithrombotics/antiplatelets: Secondary | ICD-10-CM | POA: Diagnosis not present

## 2016-10-18 DIAGNOSIS — M48061 Spinal stenosis, lumbar region without neurogenic claudication: Secondary | ICD-10-CM | POA: Diagnosis not present

## 2016-10-18 DIAGNOSIS — M4316 Spondylolisthesis, lumbar region: Secondary | ICD-10-CM | POA: Diagnosis not present

## 2016-10-18 DIAGNOSIS — I2510 Atherosclerotic heart disease of native coronary artery without angina pectoris: Secondary | ICD-10-CM | POA: Diagnosis not present

## 2016-10-18 DIAGNOSIS — J449 Chronic obstructive pulmonary disease, unspecified: Secondary | ICD-10-CM | POA: Diagnosis not present

## 2016-10-18 DIAGNOSIS — Z7951 Long term (current) use of inhaled steroids: Secondary | ICD-10-CM | POA: Diagnosis not present

## 2016-10-18 DIAGNOSIS — E785 Hyperlipidemia, unspecified: Secondary | ICD-10-CM | POA: Diagnosis not present

## 2016-10-18 DIAGNOSIS — Z7902 Long term (current) use of antithrombotics/antiplatelets: Secondary | ICD-10-CM | POA: Diagnosis not present

## 2016-10-18 DIAGNOSIS — I509 Heart failure, unspecified: Secondary | ICD-10-CM | POA: Diagnosis not present

## 2016-10-18 DIAGNOSIS — I110 Hypertensive heart disease with heart failure: Secondary | ICD-10-CM | POA: Diagnosis not present

## 2016-10-18 DIAGNOSIS — Z86711 Personal history of pulmonary embolism: Secondary | ICD-10-CM | POA: Diagnosis not present

## 2016-10-18 DIAGNOSIS — Z4789 Encounter for other orthopedic aftercare: Secondary | ICD-10-CM | POA: Diagnosis not present

## 2016-10-19 DIAGNOSIS — M4316 Spondylolisthesis, lumbar region: Secondary | ICD-10-CM | POA: Diagnosis not present

## 2016-10-19 DIAGNOSIS — I2510 Atherosclerotic heart disease of native coronary artery without angina pectoris: Secondary | ICD-10-CM | POA: Diagnosis not present

## 2016-10-19 DIAGNOSIS — Z86711 Personal history of pulmonary embolism: Secondary | ICD-10-CM | POA: Diagnosis not present

## 2016-10-19 DIAGNOSIS — I110 Hypertensive heart disease with heart failure: Secondary | ICD-10-CM | POA: Diagnosis not present

## 2016-10-19 DIAGNOSIS — M48061 Spinal stenosis, lumbar region without neurogenic claudication: Secondary | ICD-10-CM | POA: Diagnosis not present

## 2016-10-19 DIAGNOSIS — E785 Hyperlipidemia, unspecified: Secondary | ICD-10-CM | POA: Diagnosis not present

## 2016-10-19 DIAGNOSIS — I509 Heart failure, unspecified: Secondary | ICD-10-CM | POA: Diagnosis not present

## 2016-10-19 DIAGNOSIS — Z4789 Encounter for other orthopedic aftercare: Secondary | ICD-10-CM | POA: Diagnosis not present

## 2016-10-19 DIAGNOSIS — Z7902 Long term (current) use of antithrombotics/antiplatelets: Secondary | ICD-10-CM | POA: Diagnosis not present

## 2016-10-19 DIAGNOSIS — J449 Chronic obstructive pulmonary disease, unspecified: Secondary | ICD-10-CM | POA: Diagnosis not present

## 2016-10-19 DIAGNOSIS — Z7951 Long term (current) use of inhaled steroids: Secondary | ICD-10-CM | POA: Diagnosis not present

## 2016-10-21 DIAGNOSIS — Z86711 Personal history of pulmonary embolism: Secondary | ICD-10-CM | POA: Diagnosis not present

## 2016-10-21 DIAGNOSIS — M48061 Spinal stenosis, lumbar region without neurogenic claudication: Secondary | ICD-10-CM | POA: Diagnosis not present

## 2016-10-21 DIAGNOSIS — Z7902 Long term (current) use of antithrombotics/antiplatelets: Secondary | ICD-10-CM | POA: Diagnosis not present

## 2016-10-21 DIAGNOSIS — I509 Heart failure, unspecified: Secondary | ICD-10-CM | POA: Diagnosis not present

## 2016-10-21 DIAGNOSIS — J449 Chronic obstructive pulmonary disease, unspecified: Secondary | ICD-10-CM | POA: Diagnosis not present

## 2016-10-21 DIAGNOSIS — M4316 Spondylolisthesis, lumbar region: Secondary | ICD-10-CM | POA: Diagnosis not present

## 2016-10-21 DIAGNOSIS — I110 Hypertensive heart disease with heart failure: Secondary | ICD-10-CM | POA: Diagnosis not present

## 2016-10-21 DIAGNOSIS — Z4789 Encounter for other orthopedic aftercare: Secondary | ICD-10-CM | POA: Diagnosis not present

## 2016-10-21 DIAGNOSIS — Z7951 Long term (current) use of inhaled steroids: Secondary | ICD-10-CM | POA: Diagnosis not present

## 2016-10-21 DIAGNOSIS — E785 Hyperlipidemia, unspecified: Secondary | ICD-10-CM | POA: Diagnosis not present

## 2016-10-21 DIAGNOSIS — I2510 Atherosclerotic heart disease of native coronary artery without angina pectoris: Secondary | ICD-10-CM | POA: Diagnosis not present

## 2016-10-25 DIAGNOSIS — M4316 Spondylolisthesis, lumbar region: Secondary | ICD-10-CM | POA: Diagnosis not present

## 2016-10-28 ENCOUNTER — Other Ambulatory Visit: Payer: Self-pay | Admitting: Cardiology

## 2016-11-24 DIAGNOSIS — E785 Hyperlipidemia, unspecified: Secondary | ICD-10-CM | POA: Diagnosis not present

## 2016-11-24 DIAGNOSIS — Z0000 Encounter for general adult medical examination without abnormal findings: Secondary | ICD-10-CM | POA: Diagnosis not present

## 2016-11-24 DIAGNOSIS — I10 Essential (primary) hypertension: Secondary | ICD-10-CM | POA: Diagnosis not present

## 2016-12-21 ENCOUNTER — Other Ambulatory Visit: Payer: Self-pay | Admitting: Cardiology

## 2017-01-24 DIAGNOSIS — M4316 Spondylolisthesis, lumbar region: Secondary | ICD-10-CM | POA: Diagnosis not present

## 2017-02-22 DIAGNOSIS — M545 Low back pain: Secondary | ICD-10-CM | POA: Diagnosis not present

## 2017-02-22 DIAGNOSIS — I10 Essential (primary) hypertension: Secondary | ICD-10-CM | POA: Diagnosis not present

## 2017-02-22 DIAGNOSIS — M159 Polyosteoarthritis, unspecified: Secondary | ICD-10-CM | POA: Diagnosis not present

## 2017-03-22 ENCOUNTER — Other Ambulatory Visit: Payer: Self-pay | Admitting: Cardiology

## 2017-05-31 DIAGNOSIS — I25119 Atherosclerotic heart disease of native coronary artery with unspecified angina pectoris: Secondary | ICD-10-CM | POA: Diagnosis not present

## 2017-05-31 DIAGNOSIS — E785 Hyperlipidemia, unspecified: Secondary | ICD-10-CM | POA: Diagnosis not present

## 2017-05-31 DIAGNOSIS — M545 Low back pain: Secondary | ICD-10-CM | POA: Diagnosis not present

## 2017-05-31 DIAGNOSIS — I10 Essential (primary) hypertension: Secondary | ICD-10-CM | POA: Diagnosis not present

## 2017-05-31 DIAGNOSIS — E789 Disorder of lipoprotein metabolism, unspecified: Secondary | ICD-10-CM | POA: Diagnosis not present

## 2017-07-28 ENCOUNTER — Other Ambulatory Visit: Payer: Self-pay | Admitting: Cardiology

## 2017-08-29 DIAGNOSIS — Z23 Encounter for immunization: Secondary | ICD-10-CM | POA: Diagnosis not present

## 2017-08-31 DIAGNOSIS — E785 Hyperlipidemia, unspecified: Secondary | ICD-10-CM | POA: Diagnosis not present

## 2017-08-31 DIAGNOSIS — M545 Low back pain: Secondary | ICD-10-CM | POA: Diagnosis not present

## 2017-08-31 DIAGNOSIS — I10 Essential (primary) hypertension: Secondary | ICD-10-CM | POA: Diagnosis not present

## 2017-09-12 DIAGNOSIS — M545 Low back pain: Secondary | ICD-10-CM | POA: Diagnosis not present

## 2017-09-12 DIAGNOSIS — E785 Hyperlipidemia, unspecified: Secondary | ICD-10-CM | POA: Diagnosis not present

## 2017-09-12 DIAGNOSIS — I10 Essential (primary) hypertension: Secondary | ICD-10-CM | POA: Diagnosis not present

## 2017-09-23 ENCOUNTER — Ambulatory Visit: Payer: Medicare Other | Admitting: Cardiology

## 2017-10-24 IMAGING — RF DG C-ARM 61-120 MIN
1 series · 2 of 2 positions shown · non-contrast
Comparison: MRI November 03, 2015

CLINICAL DATA: L4-5 fixation

EXAM:
DG C-ARM 61-120 MIN

[Series 1: run · 2 of 2 slices shown]
[im 1/2]
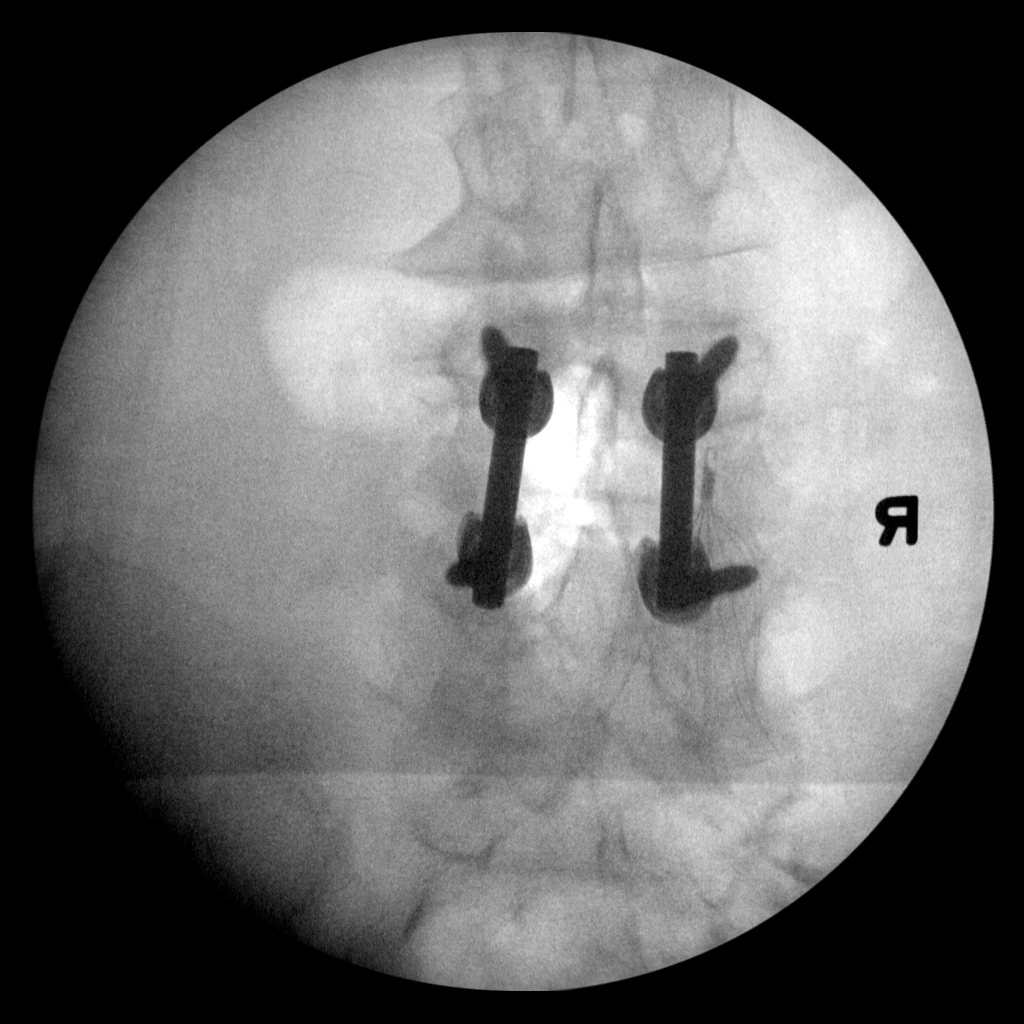
[im 2/2]
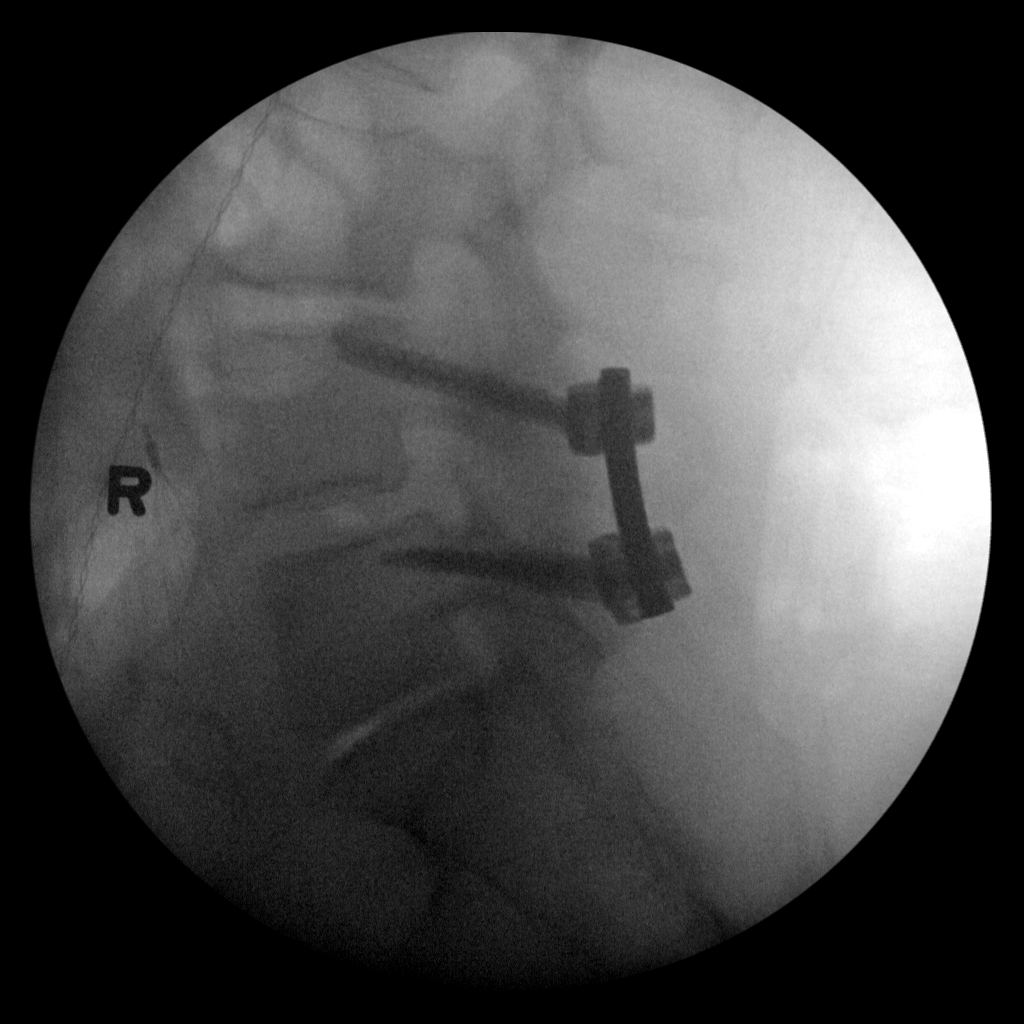

[2 of 2 positions shown; findings below may reference images not displayed]

FINDINGS: L4-5 pedicle rods and screws have been placed. An IVC filter is
identified.
IMPRESSION: L4-5 pedicle rods and screws as above.

## 2017-10-25 ENCOUNTER — Other Ambulatory Visit: Payer: Self-pay | Admitting: Cardiology

## 2017-11-01 ENCOUNTER — Encounter: Payer: Self-pay | Admitting: Cardiology

## 2017-11-01 NOTE — Progress Notes (Signed)
Cardiology Office Note  Date: 11/02/2017   ID: [name], DOB Jun 10, 1931, MRN [id number]  PCP: Alison Beard, MD  Primary Cardiologist: Alison Lesches, MD   Chief Complaint  Patient presents with  . Coronary Artery Disease    History of Present Illness: Alison [name] is an 84 y.o. female last seen in September 2017. She presents today with family member for a follow-up visit. From a cardiac perspective, she does not report any significant angina symptoms or nitroglycerin use. She is getting around using a cane, limited by arthritic pains. She underwent lumbar spine surgery with decompression and fusion at L4-L5, discharged without obvious perioperative cardiac complications in December 2017.  Lexiscan Myoview from 2017 is outlined below.  Current cardiac regimen includes Plavix, Lasix, Cozaar, Toprol-XL, Crestor, and as needed nitroglycerin.  I reviewed her ECG today which shows sinus rhythm with leftward axis and nonspecific T-wave changes.  Past Medical History:  Diagnosis Date  . Chronic anemia   . Chronic back pain   . COPD (chronic obstructive pulmonary disease) ([place])   . Coronary atherosclerosis of native coronary artery    Occluded circ with collaterals and 90% RCA (previous stents at [facility], details not clear), LVEF 48% 1/11 - inferior and inferolateral scar by Myoview 1/11,   . Gout   . History of blood transfusion   . History of pneumonia   . Hyperlipidemia   . Hypertension   . Myocardial infarction ([place]) 1997  . Nocturia   . Osteoarthritis   . Pulmonary embolism (HCC)    History of recurrent DVT and PE status post IVC filter, previously on Coumadin    Past Surgical History:  Procedure Laterality Date  . ABDOMINAL HYSTERECTOMY  1965   partial  . APPENDECTOMY    . BACK SURGERY    . cataract surgery Bilateral   . CORONARY ANGIOPLASTY  1997   3 stents  . cyst removed from back  2006  . LAMINECTOMY WITH POSTERIOR LATERAL ARTHRODESIS LEVEL 1  N/A 09/10/2016   Procedure: Posterior Lateral Fusion - Lumbar four-Lumbar five, lumbar laminectomy and pedicle screw fixation  - Lumbar four-Lumbar five;  Surgeon: Eustace Moore, MD;  Location: [place];  Service: Neurosurgery;  Laterality: N/A;  . Left adrenalectomy  2009  . Right breast biopsy  2008  . Right knee replacement  2011    Current Outpatient Medications  Medication Sig Dispense Refill  . acetaminophen (TYLENOL) 650 MG CR tablet Take 650 mg by mouth 2 (two) times daily.     Marland Kitchen ADVAIR DISKUS 250-50 MCG/DOSE AEPB Inhale 1 Inhaler into the lungs 2 (two) times daily.     Marland Kitchen allopurinol (ZYLOPRIM) 100 MG tablet Take 100 mg by mouth daily.    . Cholecalciferol (VITAMIN D3) 1000 units CAPS Take 1,000 Units by mouth daily.     . clopidogrel (PLAVIX) 75 MG tablet TAKE 1 TABLET BY MOUTH EVERY DAY 30 tablet 0  . ferrous sulfate 325 (65 FE) MG tablet Take 325 mg by mouth daily.     . furosemide (LASIX) 40 MG tablet Take 40 mg by mouth daily.    Marland Kitchen losartan (COZAAR) 25 MG tablet TAKE 1 TABLET BY MOUTH EVERY DAY 30 tablet 0  . metoprolol succinate (TOPROL-XL) 50 MG 24 hr tablet TAKE 1 TABLET EVERY DAY WITH OR IMMEDIATELY FOLLOWING A MEAL 30 tablet 0  . nitroGLYCERIN (NITROSTAT) 0.4 MG SL tablet Place 0.4 mg under the tongue every 5 (five) minutes as needed for chest  pain.     . rosuvastatin (CRESTOR) 10 MG tablet TAKE 1 TABLET EVERY DAY 30 tablet 0   No current facility-administered medications for this visit.    Allergies:  Patient has no known allergies.   Social History: The patient  reports that she quit smoking about 40 years ago. She smoked 0.20 packs per day. she has never used smokeless tobacco. She reports that she does not drink alcohol or use drugs.   ROS:  Please see the history of present illness. Otherwise, complete review of systems is positive for arthritic pains, hearing loss.  All other systems are reviewed and negative.   Physical Exam: VS:  BP 128/68   Pulse 70   Ht 5' 1.5"  (1.562 m)   Wt 227 lb 6.4 oz (103.1 kg)   SpO2 96%   BMI 42.27 kg/m , BMI Body mass index is 42.27 kg/m.  Wt Readings from Last 3 Encounters:  11/02/17 227 lb 6.4 oz (103.1 kg)  09/10/16 221 lb (100.2 kg)  09/08/16 221 lb (100.2 kg)    General: Elderly woman, appears comfortable at rest. HEENT: Conjunctiva and lids normal, oropharynx clear. Neck: Supple, no elevated JVP or carotid bruits, no thyromegaly. Lungs: Clear to auscultation, nonlabored breathing at rest. Cardiac: Regular rate and rhythm, no S3, soft systolic murmur, no pericardial rub. Abdomen: Soft, nontender, bowel sounds present, no guarding or rebound. Extremities: Mild ankle edema, distal pulses 2+. Skin: Warm and dry. Musculoskeletal: No kyphosis. Neuropsychiatric: Alert and oriented x3, affect grossly appropriate.  ECG: I personally reviewed the tracing from 08/22/2016 which showed sinus rhythm with IVCD, old anterior infarct pattern.  Other Studies Reviewed Today:  Lexiscan Myoview 07/07/2016:  There was no ST segment deviation noted during stress.  Defect 1: There is a medium defect of moderate severity present in the basal inferior, basal inferolateral, mid inferior, mid inferolateral and apical inferior location.  While this may represent myocardial scar, significant amount of soft tissue attenuation artifact also noted.  This is an intermediate risk study. No evidence of ischemia.  Nuclear stress EF: 59%.  Assessment and Plan:  1. Symptomatically stable CAD with history of occluded circumflex associated with collaterals and RCA stenting at [facility]. Myoview study from last year was overall intermediate risk with evidence of infarct scar but no active ischemia and normal LVEF. Plan to continue medical therapy and observation.  2. Mixed hyperlipidemia, continues on Crestor.  3. Essential hypertension, on Cozaar. Blood pressure control is adequate today.  4. Previous history of DVT and pulmonary embolus,  status post IVC filter.  Current medicines were reviewed with the patient today.   Orders Placed This Encounter  Procedures  . EKG 12-Lead    Disposition: Follow-up in one year, sooner if needed.  Signed, Satira Sark, MD, [place] 11/02/2017 2:06 PM    [facility] [place], [place], [place] [address] Phone: [phone]; Fax: [phone]

## 2017-11-02 ENCOUNTER — Ambulatory Visit: Payer: Medicare Other | Admitting: Cardiology

## 2017-11-02 ENCOUNTER — Encounter: Payer: Self-pay | Admitting: Cardiology

## 2017-11-02 VITALS — BP 128/68 | HR 70 | Ht 61.5 in | Wt 227.4 lb

## 2017-11-02 DIAGNOSIS — E782 Mixed hyperlipidemia: Secondary | ICD-10-CM

## 2017-11-02 DIAGNOSIS — Z86711 Personal history of pulmonary embolism: Secondary | ICD-10-CM

## 2017-11-02 DIAGNOSIS — I10 Essential (primary) hypertension: Secondary | ICD-10-CM

## 2017-11-02 DIAGNOSIS — I2510 Atherosclerotic heart disease of native coronary artery without angina pectoris: Secondary | ICD-10-CM

## 2017-11-02 NOTE — Patient Instructions (Signed)

## 2017-11-24 ENCOUNTER — Other Ambulatory Visit: Payer: Self-pay | Admitting: Cardiology

## 2017-12-12 DIAGNOSIS — M545 Low back pain: Secondary | ICD-10-CM | POA: Diagnosis not present

## 2017-12-12 DIAGNOSIS — I10 Essential (primary) hypertension: Secondary | ICD-10-CM | POA: Diagnosis not present

## 2017-12-12 DIAGNOSIS — E785 Hyperlipidemia, unspecified: Secondary | ICD-10-CM | POA: Diagnosis not present

## 2018-01-22 ENCOUNTER — Other Ambulatory Visit: Payer: Self-pay | Admitting: Cardiology

## 2018-03-15 DIAGNOSIS — I5032 Chronic diastolic (congestive) heart failure: Secondary | ICD-10-CM | POA: Diagnosis not present

## 2018-03-15 DIAGNOSIS — I10 Essential (primary) hypertension: Secondary | ICD-10-CM | POA: Diagnosis not present

## 2018-03-15 DIAGNOSIS — E785 Hyperlipidemia, unspecified: Secondary | ICD-10-CM | POA: Diagnosis not present

## 2018-04-22 ENCOUNTER — Other Ambulatory Visit: Payer: Self-pay | Admitting: Cardiology

## 2018-05-07 ENCOUNTER — Other Ambulatory Visit: Payer: Self-pay | Admitting: Cardiology

## 2018-06-19 DIAGNOSIS — M545 Low back pain: Secondary | ICD-10-CM | POA: Diagnosis not present

## 2018-06-19 DIAGNOSIS — I10 Essential (primary) hypertension: Secondary | ICD-10-CM | POA: Diagnosis not present

## 2018-06-19 DIAGNOSIS — E785 Hyperlipidemia, unspecified: Secondary | ICD-10-CM | POA: Diagnosis not present

## 2018-07-25 DIAGNOSIS — Z23 Encounter for immunization: Secondary | ICD-10-CM | POA: Diagnosis not present

## 2018-08-01 DIAGNOSIS — M545 Low back pain: Secondary | ICD-10-CM | POA: Diagnosis not present

## 2018-08-01 DIAGNOSIS — G8929 Other chronic pain: Secondary | ICD-10-CM | POA: Diagnosis not present

## 2018-08-01 DIAGNOSIS — I10 Essential (primary) hypertension: Secondary | ICD-10-CM | POA: Diagnosis not present

## 2018-08-16 ENCOUNTER — Other Ambulatory Visit: Payer: Self-pay | Admitting: Cardiology

## 2018-09-19 ENCOUNTER — Other Ambulatory Visit: Payer: Self-pay | Admitting: Cardiology

## 2018-11-08 NOTE — Progress Notes (Signed)
Cardiology Office Note  Date: 11/09/2018   ID: Alison [name], DOB 1931-05-17, MRN [id number]  PCP: [name], MD  Primary Cardiologist: Rozann Lesches, MD   Chief Complaint  Patient presents with  . Coronary Artery Disease    History of Present Illness: Alison [name] is an 85 y.o. female last seen in January 2019.  She is here today with family member for a follow-up visit.  She does not report any angina symptoms at this time.  She still lives in her own home, remains functional with ADLs.  She does have intermittent back pain.  Also short of breath with activities.  No palpitations or syncope.  I personally reviewed her ECG today which shows sinus bradycardia with PAC, nonspecific ST-T changes, left anterior fascicular block, poor R wave progression.  We reviewed her medications and discussed cutting Toprol-XL down to 25 mg daily.  Otherwise no changes at this time.  Past Medical History:  Diagnosis Date  . Chronic anemia   . Chronic back pain   . COPD (chronic obstructive pulmonary disease) ([place])   . Coronary atherosclerosis of native coronary artery    Occluded circ with collaterals and 90% RCA (previous stents at [facility], details not clear), LVEF 48% 1/11 - inferior and inferolateral scar by Myoview 1/11,   . Gout   . History of blood transfusion   . History of pneumonia   . Hyperlipidemia   . Hypertension   . Myocardial infarction ([place]) 1997  . Nocturia   . Osteoarthritis   . Pulmonary embolism (HCC)    History of recurrent DVT and PE status post IVC filter, previously on Coumadin    Past Surgical History:  Procedure Laterality Date  . ABDOMINAL HYSTERECTOMY  1965   partial  . APPENDECTOMY    . BACK SURGERY    . cataract surgery Bilateral   . CORONARY ANGIOPLASTY  1997   3 stents  . cyst removed from back  2006  . LAMINECTOMY WITH POSTERIOR LATERAL ARTHRODESIS LEVEL 1 N/A 09/10/2016   Procedure: Posterior Lateral Fusion - Lumbar four-Lumbar  five, lumbar laminectomy and pedicle screw fixation  - Lumbar four-Lumbar five;  Surgeon: Eustace Moore, MD;  Location: [place];  Service: Neurosurgery;  Laterality: N/A;  . Left adrenalectomy  2009  . Right breast biopsy  2008  . Right knee replacement  2011    Current Outpatient Medications  Medication Sig Dispense Refill  . acetaminophen (TYLENOL) 650 MG CR tablet Take 650 mg by mouth 2 (two) times daily.     Marland Kitchen ADVAIR DISKUS 250-50 MCG/DOSE AEPB Inhale 1 Inhaler into the lungs 2 (two) times daily.     Marland Kitchen allopurinol (ZYLOPRIM) 100 MG tablet Take 100 mg by mouth daily.    . Cholecalciferol (VITAMIN D3) 1000 units CAPS Take 1,000 Units by mouth daily.     . clopidogrel (PLAVIX) 75 MG tablet TAKE 1 TABLET BY MOUTH EVERY DAY 90 tablet 3  . ferrous sulfate 325 (65 FE) MG tablet Take 325 mg by mouth daily.     . furosemide (LASIX) 40 MG tablet Take 40 mg by mouth daily.    Marland Kitchen losartan (COZAAR) 25 MG tablet TAKE 1 TABLET BY MOUTH EVERY DAY 90 tablet 1  . metoprolol succinate (TOPROL-XL) 50 MG 24 hr tablet TAKE 1 TABLET EVERY DAY WITH OR IMMEDIATELY FOLLOWING A MEAL 90 tablet 1  . nitroGLYCERIN (NITROSTAT) 0.4 MG SL tablet Place 0.4 mg under the tongue every 5 (five) minutes  as needed for chest pain.     . rosuvastatin (CRESTOR) 10 MG tablet TAKE 1 TABLET BY MOUTH EVERY DAY 90 tablet 3   No current facility-administered medications for this visit.    Allergies:  Patient has no known allergies.   Social History: The patient  reports that she quit smoking about 41 years ago. She smoked 0.20 packs per day. She has never used smokeless tobacco. She reports that she does not drink alcohol or use drugs.   ROS:  Please see the history of present illness. Otherwise, complete review of systems is positive for hearing loss.  All other systems are reviewed and negative.   Physical Exam: VS:  BP 137/74   Pulse (!) 56   Ht 5\' 1"  (1.549 m)   Wt 220 lb 12.8 oz (100.2 kg)   SpO2 98%   BMI 41.72 kg/m , BMI  Body mass index is 41.72 kg/m.  Wt Readings from Last 3 Encounters:  11/09/18 220 lb 12.8 oz (100.2 kg)  11/02/17 227 lb 6.4 oz (103.1 kg)  09/10/16 221 lb (100.2 kg)    General: Elderly woman, appears comfortable at rest. HEENT: Conjunctiva and lids normal, oropharynx clear. Neck: Supple, no elevated JVP or carotid bruits, no thyromegaly. Lungs: Clear to auscultation, nonlabored breathing at rest. Cardiac: Regular rate and rhythm, no S3, soft systolic murmur. Abdomen: Soft, nontender, bowel sounds present. Extremities: Mild ankle edema, distal pulses 2+. Skin: Warm and dry. Musculoskeletal: No kyphosis. Neuropsychiatric: Alert and oriented x3, affect grossly appropriate.  ECG: I personally reviewed the tracing from 11/02/2017 which showed sinus rhythm with leftward axis and nonspecific T wave changes.  Other Studies Reviewed Today:  Lexiscan Myoview 07/07/2016:  There was no ST segment deviation noted during stress.  Defect 1: There is a medium defect of moderate severity present in the basal inferior, basal inferolateral, mid inferior, mid inferolateral and apical inferior location.  While this may represent myocardial scar, significant amount of soft tissue attenuation artifact also noted.  This is an intermediate risk study. No evidence of ischemia.  Nuclear stress EF: 59%.  Assessment and Plan:  1.  CAD with occluded circumflex associated with collaterals and previous RCA stent intervention at [facility].  We plan to continue medical therapy and observation at this point in the absence of accelerating angina.  Toprol-XL will be cut back due to bradycardia and evidence of conduction system disease on ECG.  2.  Mixed hyperlipidemia on Crestor.  Keep follow-up with PCP.  3.  Essential hypertension, blood pressure is adequately controlled today.  Continue Cozaar.  Current medicines were reviewed with the patient today.   Orders Placed This Encounter  Procedures  . EKG  12-Lead    Disposition: Follow-up in 1 year.  Signed, Satira Sark, MD, [facility] 11/09/2018 10:34 AM    [facility] [place], [place],  [address] Phone: [phone]; Fax: [phone]

## 2018-11-09 ENCOUNTER — Encounter: Payer: Self-pay | Admitting: Cardiology

## 2018-11-09 ENCOUNTER — Ambulatory Visit: Payer: Medicare Other | Admitting: Cardiology

## 2018-11-09 VITALS — BP 137/74 | HR 56 | Ht 61.0 in | Wt 220.8 lb

## 2018-11-09 DIAGNOSIS — I10 Essential (primary) hypertension: Secondary | ICD-10-CM

## 2018-11-09 DIAGNOSIS — I2510 Atherosclerotic heart disease of native coronary artery without angina pectoris: Secondary | ICD-10-CM | POA: Diagnosis not present

## 2018-11-09 DIAGNOSIS — E782 Mixed hyperlipidemia: Secondary | ICD-10-CM

## 2018-11-09 MED ORDER — METOPROLOL SUCCINATE ER 25 MG PO TB24: 25.0000 mg | ORAL_TABLET | Freq: Every day | ORAL | 2 refills | Status: DC

## 2018-11-09 NOTE — Patient Instructions (Addendum)
Medication Instructions:   Your physician has recommended you make the following change in your medication:   Decrease metoprolol to 25 mg by mouth daily  Continue all other medications the same  Labwork:  NONE  Testing/Procedures:  NONE  Follow-Up:  Your physician recommends that you schedule a follow-up appointment in: 1 year. You will receive a reminder letter in the mail in about 10 months reminding you to call and schedule your appointment. If you don't receive this letter, please contact our office.  Any Other Special Instructions Will Be Listed Below (If Applicable).  If you need a refill on your cardiac medications before your next appointment, please call your pharmacy.

## 2018-12-26 ENCOUNTER — Telehealth: Payer: Self-pay | Admitting: *Deleted

## 2018-12-26 NOTE — Telephone Encounter (Signed)
Pt voiced understanding - says she will monitor symptoms and update [place] if decrease doesn't help

## 2018-12-26 NOTE — Telephone Encounter (Signed)
   Covering for Dr. Domenic Polite - By review of her office visit in 10/2018 she had mild lower extremity edema but lungs were clear. Would recommend reducing Lasix to 20mg  daily and monitor weight and symptoms with this before stopping altogether.   She should follow-up with her PCP sooner if symptoms do not improve as she might need further evaluation or repeat labs given her variable symptoms (most recent labs in Epic are from 2017).   Signed, Erma Heritage, PA-C 12/26/2018, 4:16 PM Pager: [phone]

## 2018-12-26 NOTE — Telephone Encounter (Signed)
Pt c/o SOB/blurred vision/muscle cramps and dry mouth for a "long time" more that a few months that hasn't gotten any worse but has not improved - thinks the lasix 40 mg is causing this but has been taking for at least 3 years - says she will see pcp in 3 months but wanted to know if she should stop taking lasix

## 2019-02-20 ENCOUNTER — Other Ambulatory Visit: Payer: Self-pay | Admitting: Cardiology

## 2019-03-20 ENCOUNTER — Other Ambulatory Visit: Payer: Self-pay | Admitting: Cardiology

## 2019-03-27 ENCOUNTER — Other Ambulatory Visit: Payer: Self-pay | Admitting: *Deleted

## 2019-03-27 MED ORDER — FUROSEMIDE 20 MG PO TABS: 20.0000 mg | ORAL_TABLET | Freq: Every day | ORAL | 11 refills | Status: DC

## 2019-04-05 ENCOUNTER — Other Ambulatory Visit: Payer: Self-pay

## 2019-04-05 NOTE — Patient Outreach (Signed)
[facility]) Care Management  04/05/2019  Alison [name] December 29, 1930 [id number]   Medication Adherence call to Alison [name] Telephone call to Patient regarding Medication Adherence unable to reach patient. Alison [name] is showing past due on Losartan 25 mg under [place].   [place] Management Direct Dial [phone]  Fax [phone] [email or web address]

## 2019-04-24 ENCOUNTER — Other Ambulatory Visit: Payer: Self-pay | Admitting: Cardiology

## 2019-04-25 ENCOUNTER — Other Ambulatory Visit: Payer: Self-pay | Admitting: *Deleted

## 2019-04-25 MED ORDER — FUROSEMIDE 20 MG PO TABS: 20.0000 mg | ORAL_TABLET | Freq: Every day | ORAL | 3 refills | Status: DC

## 2019-04-25 MED ORDER — METOPROLOL SUCCINATE ER 25 MG PO TB24: 25.0000 mg | ORAL_TABLET | Freq: Every day | ORAL | 3 refills | Status: DC

## 2019-04-25 MED ORDER — ROSUVASTATIN CALCIUM 10 MG PO TABS: 10.0000 mg | ORAL_TABLET | Freq: Every day | ORAL | 3 refills | Status: DC

## 2019-04-25 MED ORDER — LOSARTAN POTASSIUM 25 MG PO TABS: 25.0000 mg | ORAL_TABLET | Freq: Every day | ORAL | 3 refills | Status: DC

## 2019-04-25 MED ORDER — CLOPIDOGREL BISULFATE 75 MG PO TABS: 75.0000 mg | ORAL_TABLET | Freq: Every day | ORAL | 3 refills | Status: DC

## 2019-09-18 DIAGNOSIS — M545 Low back pain: Secondary | ICD-10-CM | POA: Diagnosis not present

## 2019-09-18 DIAGNOSIS — I10 Essential (primary) hypertension: Secondary | ICD-10-CM | POA: Diagnosis not present

## 2019-09-18 DIAGNOSIS — I25119 Atherosclerotic heart disease of native coronary artery with unspecified angina pectoris: Secondary | ICD-10-CM | POA: Diagnosis not present

## 2019-09-18 DIAGNOSIS — E785 Hyperlipidemia, unspecified: Secondary | ICD-10-CM | POA: Diagnosis not present

## 2019-10-01 DIAGNOSIS — I5032 Chronic diastolic (congestive) heart failure: Secondary | ICD-10-CM | POA: Diagnosis not present

## 2019-10-01 DIAGNOSIS — E785 Hyperlipidemia, unspecified: Secondary | ICD-10-CM | POA: Diagnosis not present

## 2019-10-01 DIAGNOSIS — I25119 Atherosclerotic heart disease of native coronary artery with unspecified angina pectoris: Secondary | ICD-10-CM | POA: Diagnosis not present

## 2019-10-01 DIAGNOSIS — I10 Essential (primary) hypertension: Secondary | ICD-10-CM | POA: Diagnosis not present

## 2019-10-01 DIAGNOSIS — Z0000 Encounter for general adult medical examination without abnormal findings: Secondary | ICD-10-CM | POA: Diagnosis not present

## 2019-11-01 ENCOUNTER — Telehealth: Payer: Self-pay | Admitting: Cardiology

## 2019-11-01 NOTE — Telephone Encounter (Signed)

## 2019-11-02 DIAGNOSIS — Z23 Encounter for immunization: Secondary | ICD-10-CM | POA: Diagnosis not present

## 2019-11-12 ENCOUNTER — Encounter: Payer: Self-pay | Admitting: Cardiology

## 2019-11-12 ENCOUNTER — Telehealth (INDEPENDENT_AMBULATORY_CARE_PROVIDER_SITE_OTHER): Payer: Medicare Other | Admitting: Cardiology

## 2019-11-12 ENCOUNTER — Encounter: Payer: Self-pay | Admitting: *Deleted

## 2019-11-12 DIAGNOSIS — E782 Mixed hyperlipidemia: Secondary | ICD-10-CM

## 2019-11-12 DIAGNOSIS — I10 Essential (primary) hypertension: Secondary | ICD-10-CM

## 2019-11-12 DIAGNOSIS — I25119 Atherosclerotic heart disease of native coronary artery with unspecified angina pectoris: Secondary | ICD-10-CM | POA: Diagnosis not present

## 2019-11-12 NOTE — Progress Notes (Signed)
Virtual Visit via Telephone Note   This visit type was conducted due to national recommendations for restrictions regarding the COVID-19 Pandemic (e.g. social distancing) in an effort to limit this patient's exposure and mitigate transmission in our community.  Due to her co-morbid illnesses, this patient is at least at moderate risk for complications without adequate follow up.  This format is felt to be most appropriate for this patient at this time.  The patient did not have access to video technology/had technical difficulties with video requiring transitioning to audio format only (telephone).  All issues noted in this document were discussed and addressed.  No physical exam could be performed with this format.  Please refer to the patient's chart for her  consent to telehealth for [facility].   Date:  11/12/2019   ID:  Alison [name], DOB [birth year], MRN [id number]  Patient Location: Home Provider Location: Office  PCP:  [name], MD  Cardiologist:  Rozann Lesches, MD Electrophysiologist:  None   Evaluation Performed:  Follow-Up Visit  Chief Complaint:  Cardiac follow-up  History of Present Illness:    Alison [name] is an 86 y.o. female last seen in January 2020.  We spoke by phone today.  She tells me that she has been doing reasonably well.  She stays at home a lot during the pandemic, has family members and do her shopping for her.  She does not report any palpitations or chest pain, has not had to use nitroglycerin.  I reviewed her cardiac medications which are stable and outlined below.  She reports no obvious intolerances, and continues to follow with Dr. Berdine Addison.  We are requesting her most recent lab work.  The patient does not have symptoms concerning for COVID-19 infection (fever, chills, cough, or new shortness of breath).    Past Medical History:  Diagnosis Date  . Chronic anemia   . Chronic back pain   . COPD (chronic obstructive pulmonary disease)  ([place])   . Coronary atherosclerosis of native coronary artery    Occluded circ with collaterals and 90% RCA (previous stents at [facility], details not clear), LVEF 48% 1/11 - inferior and inferolateral scar by Myoview 1/11,   . Gout   . History of blood transfusion   . History of pneumonia   . Hyperlipidemia   . Hypertension   . Myocardial infarction ([place]) 1997  . Nocturia   . Osteoarthritis   . Pulmonary embolism (HCC)    History of recurrent DVT and PE status post IVC filter, previously on Coumadin   Past Surgical History:  Procedure Laterality Date  . ABDOMINAL HYSTERECTOMY  1965   partial  . APPENDECTOMY    . BACK SURGERY    . cataract surgery Bilateral   . CORONARY ANGIOPLASTY  1997   3 stents  . cyst removed from back  2006  . LAMINECTOMY WITH POSTERIOR LATERAL ARTHRODESIS LEVEL 1 N/A 09/10/2016   Procedure: Posterior Lateral Fusion - Lumbar four-Lumbar five, lumbar laminectomy and pedicle screw fixation  - Lumbar four-Lumbar five;  Surgeon: Eustace Moore, MD;  Location: [facility];  Service: Neurosurgery;  Laterality: N/A;  . Left adrenalectomy  2009  . Right breast biopsy  2008  . Right knee replacement  2011     Current Meds  Medication Sig  . acetaminophen (TYLENOL) 650 MG CR tablet Take 650 mg by mouth 2 (two) times daily.   Marland Kitchen ADVAIR DISKUS 250-50 MCG/DOSE AEPB Inhale 1 Inhaler into the lungs 2 (two)  times daily.   Marland Kitchen allopurinol (ZYLOPRIM) 100 MG tablet Take 100 mg by mouth daily.  . Cholecalciferol (VITAMIN D3) 1000 units CAPS Take 1,000 Units by mouth daily.   . clopidogrel (PLAVIX) 75 MG tablet Take 1 tablet (75 mg total) by mouth daily.  . ferrous sulfate 325 (65 FE) MG tablet Take 325 mg by mouth daily.   . furosemide (LASIX) 20 MG tablet Take 1 tablet (20 mg total) by mouth daily.  Marland Kitchen losartan (COZAAR) 25 MG tablet Take 1 tablet (25 mg total) by mouth daily.  . metoprolol succinate (TOPROL XL) 25 MG 24 hr tablet Take 1 tablet (25 mg total) by mouth daily.  .  nitroGLYCERIN (NITROSTAT) 0.4 MG SL tablet Place 0.4 mg under the tongue every 5 (five) minutes as needed for chest pain.   . rosuvastatin (CRESTOR) 10 MG tablet Take 1 tablet (10 mg total) by mouth daily.     Allergies:   Patient has no known allergies.   Social History   Tobacco Use  . Smoking status: Former Smoker    Packs/day: 0.20    Types: Cigarettes    Quit date: 10/11/1977    Years since quitting: 42.1  . Smokeless tobacco: Never Used  Substance Use Topics  . Alcohol use: No  . Drug use: No     Family Hx: The patient's family history includes Stroke in her father.  ROS:   Please see the history of present illness.    Hearing loss. All other systems reviewed and are negative.   Prior CV studies:   The following studies were reviewed today:  Lexiscan Myoview 07/07/2016:  There was no ST segment deviation noted during stress.  Defect 1: There is a medium defect of moderate severity present in the basal inferior, basal inferolateral, mid inferior, mid inferolateral and apical inferior location.  While this may represent myocardial scar, significant amount of soft tissue attenuation artifact also noted.  This is an intermediate risk study. No evidence of ischemia.  Nuclear stress EF: 59%.  Labs/Other Tests and Data Reviewed:    EKG:  An ECG dated 11/09/2018 was personally reviewed today and demonstrated:  Sinus bradycardia with PAC, nonspecific ST-T changes, left anterior fascicular block, poor R wave progression.  Recent Labs:  No interval lab work for review today.  Wt Readings from Last 3 Encounters:  11/12/19 224 lb (101.6 kg)  11/09/18 220 lb 12.8 oz (100.2 kg)  11/02/17 227 lb 6.4 oz (103.1 kg)     Objective:    Vital Signs:  Ht 5\' 1"  (1.549 m)   Wt 224 lb (101.6 kg)   BMI 42.32 kg/m    Patient spoke in full sentences, not short of breath. No audible wheezing or coughing.  ASSESSMENT & PLAN:    1.  CAD with history of occluded circumflex  associated with collaterals and previous RCA stent intervention at [facility].  We will continue to follow her conservatively on medical therapy.  She reports no active angina symptoms.  Continue Plavix, losartan, Toprol-XL, and Crestor.  2.  Essential hypertension, no changes made to present regimen.  Keep follow-up with Dr. Berdine Addison.  3.  Mixed hyperlipidemia, on statin therapy.  Requesting interval lab work from Dr. Berdine Addison.  COVID-19 Education: The signs and symptoms of COVID-19 were discussed with the patient and how to seek care for testing (follow up with PCP or arrange E-visit).  The importance of social distancing was discussed today.  Time:   Today, I have spent  5 minutes with the patient with telehealth technology discussing the above problems.     Medication Adjustments/Labs and Tests Ordered: Current medicines are reviewed at length with the patient today.  Concerns regarding medicines are outlined above.   Tests Ordered: No orders of the defined types were placed in this encounter.   Medication Changes: No orders of the defined types were placed in this encounter.   Follow Up:  In Person 1 year in the [place] office.  Signed, Rozann Lesches, MD  11/12/2019 11:58 AM    [place]

## 2019-11-12 NOTE — Patient Instructions (Signed)

## 2019-12-09 DIAGNOSIS — I5032 Chronic diastolic (congestive) heart failure: Secondary | ICD-10-CM | POA: Diagnosis not present

## 2019-12-09 DIAGNOSIS — Z87891 Personal history of nicotine dependence: Secondary | ICD-10-CM | POA: Diagnosis not present

## 2019-12-09 DIAGNOSIS — I110 Hypertensive heart disease with heart failure: Secondary | ICD-10-CM | POA: Diagnosis not present

## 2019-12-09 DIAGNOSIS — J449 Chronic obstructive pulmonary disease, unspecified: Secondary | ICD-10-CM | POA: Diagnosis not present

## 2020-01-23 NOTE — Progress Notes (Addendum)
Cardiology Office Note  Date: 01/24/2020   ID: AILSA MIRELES, DOB 1931-04-09, MRN [id number]  PCP:  [name], MD  Cardiologist:  Rozann Lesches, MD Electrophysiologist:  None   Chief Complaint: F/U CAD , HTN, HLD   History of Present Illness: EARLE BURSON is a 86 y.o. female with a history of CAD, HTN, HLD, COPD, chronic anemia, recurrent DVT/PE status post IVC filter previously on Coumadin.  Last visit with Dr. Domenic Polite via telemedicine on November 12, 2019.  She had no complaints of active anginal symptoms.  Blood pressure was well controlled.  She remained on statin therapy and interval lab work from PCP was requested.  Patient states she has been having increasing shortness of breath, worse when exerting.  Patient states today when going from the exam table back to her wheelchair after having her EKG done she was having some significant shortness of breath.  She states when she does almost anything that requires exertion she becomes more short of breath.  States she is having some increase in swelling in her lower extremities.  Her weight has increased approximately 11 pounds since February 1 until today.  She denies any recent acute illnesses, hospitalizations, surgeries.  Denies any anginal symptoms.  She states she has been taking acetaminophen chronically 650 mg p.o. twice daily.  Her renal function has worsened and may be related to chronic use of Tylenol.  She has never seen a nephrologist.  She continues taking Lasix 20 mg a day.  Her most recent renal function studies at PCP office; creatinine of 1.85 and GFR of 28.   Her last echocardiogram in the system appears to have been performed in 2008 showing an EF of 55 to 65%.  She had normal left ventricular wall thickness and chamber size with basal inferior posterior akinesis and grade 1 diastolic dysfunction.  She had trace mitral regurgitation.  She had moderate right ventricular enlargement with decreased  contraction.  There was mild tricuspid and trace pulmonic regurgitation with bowing of the intra-atrial septum from right to left suggesting increased right-sided pressures.  Small posterior pericardial effusion.  Past Medical History:  Diagnosis Date  . Chronic anemia   . Chronic back pain   . COPD (chronic obstructive pulmonary disease) ([place])   . Coronary atherosclerosis of native coronary artery    Occluded circ with collaterals and 90% RCA (previous stents at [facility], details not clear), LVEF 48% 1/11 - inferior and inferolateral scar by Myoview 1/11,   . Gout   . History of blood transfusion   . History of pneumonia   . Hyperlipidemia   . Hypertension   . Myocardial infarction ([place]) 1997  . Nocturia   . Osteoarthritis   . Pulmonary embolism (HCC)    History of recurrent DVT and PE status post IVC filter, previously on Coumadin    Past Surgical History:  Procedure Laterality Date  . ABDOMINAL HYSTERECTOMY  1965   partial  . APPENDECTOMY    . BACK SURGERY    . cataract surgery Bilateral   . CORONARY ANGIOPLASTY  1997   3 stents  . cyst removed from back  2006  . LAMINECTOMY WITH POSTERIOR LATERAL ARTHRODESIS LEVEL 1 N/A 09/10/2016   Procedure: Posterior Lateral Fusion - Lumbar four-Lumbar five, lumbar laminectomy and pedicle screw fixation  - Lumbar four-Lumbar five;  Surgeon: Eustace Moore, MD;  Location: [facility];  Service: Neurosurgery;  Laterality: N/A;  . Left adrenalectomy  2009  . Right breast  biopsy  2008  . Right knee replacement  2011    Current Outpatient Medications  Medication Sig Dispense Refill  . acetaminophen (TYLENOL) 650 MG CR tablet Take 650 mg by mouth 2 (two) times daily.     Marland Kitchen ADVAIR DISKUS 250-50 MCG/DOSE AEPB Inhale 1 Inhaler into the lungs 2 (two) times daily.     Marland Kitchen allopurinol (ZYLOPRIM) 100 MG tablet Take 100 mg by mouth daily.    . Cholecalciferol (VITAMIN D3) 1000 units CAPS Take 1,000 Units by mouth daily.     . clopidogrel (PLAVIX) 75 MG tablet  Take 1 tablet (75 mg total) by mouth daily. 90 tablet 3  . ferrous sulfate 325 (65 FE) MG tablet Take 325 mg by mouth daily.     . furosemide (LASIX) 20 MG tablet Take 1 tablet (20 mg total) by mouth daily. 90 tablet 3  . losartan (COZAAR) 25 MG tablet Take 1 tablet (25 mg total) by mouth daily. 90 tablet 3  . metoprolol succinate (TOPROL XL) 25 MG 24 hr tablet Take 1 tablet (25 mg total) by mouth daily. 90 tablet 3  . nitroGLYCERIN (NITROSTAT) 0.4 MG SL tablet Place 0.4 mg under the tongue every 5 (five) minutes as needed for chest pain.     . rosuvastatin (CRESTOR) 10 MG tablet Take 1 tablet (10 mg total) by mouth daily. 90 tablet 3   No current facility-administered medications for this visit.   Allergies:  Patient has no known allergies.   Social History: The patient  reports that she quit smoking about 42 years ago. Her smoking use included cigarettes. She smoked 0.20 packs per day. She has never used smokeless tobacco. She reports that she does not drink alcohol or use drugs.   Family History: The patient's family history includes Stroke in her father.   ROS:  Please see the history of present illness. Otherwise, complete review of systems is positive for none.  All other systems are reviewed and negative.   Physical Exam: VS:  BP 140/90   Pulse 80   Ht 5\' 1"  (1.549 m)   Wt 235 lb 3.2 oz (106.7 kg)   SpO2 95%   BMI 44.44 kg/m , BMI Body mass index is 44.44 kg/m.  Wt Readings from Last 3 Encounters:  01/24/20 235 lb 3.2 oz (106.7 kg)  11/12/19 224 lb (101.6 kg)  11/09/18 220 lb 12.8 oz (100.2 kg)    General: Patient appears comfortable at rest. Neck: Supple, no elevated JVP or carotid bruits, no thyromegaly. Lungs: Clear to auscultation, nonlabored breathing at rest. Cardiac: Regular rate and rhythm, no S3 or significant systolic murmur, no pericardial rub. Extremities: No pitting edema, distal pulses 2+. Skin: Warm and dry. Musculoskeletal: No kyphosis. Neuropsychiatric:  Alert and oriented x3, affect grossly appropriate.  ECG:  An ECG dated 01/24/2020 was personally reviewed today and demonstrated:  Normal sinus rhythm with sinus arrhythmia rate of 83, left axis deviation, septal infarct age undetermined.  Recent Labwork: No results found for requested labs within last 8760 hours.  No results found for: CHOL, TRIG, HDL, CHOLHDL, VLDL, LDLCALC, LDLDIRECT  Other Studies Reviewed Today:    Lexiscan Myoview 07/07/2016:  There was no ST segment deviation noted during stress.  Defect 1: There is a medium defect of moderate severity present in the basal inferior, basal inferolateral, mid inferior, mid inferolateral and apical inferior location.  While this may represent myocardial scar, significant amount of soft tissue attenuation artifact also noted.  This is an  intermediate risk study. No evidence of ischemia.  Nuclear stress EF: 59%. Assessment and Plan:  1. CAD in native artery   2. Essential hypertension   3. Mixed hyperlipidemia   4. Stage 3b chronic kidney disease    1. CAD in native artery Patient does not endorse any progressive anginal symptoms but states she has increasing shortness of breath when performing only minimal activity.  (History of MIs x2 with stent placement x3).   May need to get a Lexiscan stress test if shortness of breath persists and echo shows no structural abnormality contributing to this shortness of breath.  Continue Plavix 75 mg, nitroglycerin 0.4 mg sublingual as needed for chest pain, Toprol-XL 25 mg daily  2. Essential hypertension Blood pressure was elevated on arrival at 140/90.  Recheck in left arm 130/70.  She does not take her blood pressures at home.  She states her blood pressure is always good at primary care visits.  Advised patient to purchase a blood pressure cuff and measure her blood pressure at least once a day and to call us if sustained blood pressures greater than 130/80.  3. Mixed  hyperlipidemia Please get recent lab work from Dr. Berdine Addison to check her recent lipid levels.  Continue Crestor 10 mg daily.   4. CKD IIIb Patient has had decreased renal function which appears to be progressively worsening over time.  Recent creatinine and GFR at PCP office were 1.85 and 28 respectively.  Please refer patient to nephrology Dr. Theador Hawthorne.  Patient states she has been taking daily dose of acetaminophen 1300 mg chronically over time.  Advised patient this could affect her kidney function and only to take the medication for episodic back pain.  Advised her this is not a medicine that you take chronically.  5.  Dyspnea on exertion   Patient states when she performs any activity recently this brings on the significant shortness of breath.  She has had some recent weight gain of 11 pounds from  February 1st this year until now.  She complains of some lower extremity edema in addition to shortness of breath and weight gain.  We will get a repeat echocardiogram  Continue Lasix 20 mg daily.  May take an extra dose on Mondays and Thursdays as needed for shortness of breath, lower extremity swelling.  Dr. Theador Hawthorne will advise dosage changes that may be needed when you see him.  Medication Adjustments/Labs and Tests Ordered: Current medicines are reviewed at length with the patient today.  Concerns regarding medicines are outlined above.   Disposition: Follow-up with Dr Domenic Polite or APP  Signed, Levell July, NP 01/24/2020 10:18 AM    [facility] [place], [place], [place] [address] Phone: [phone]; Fax: [phone]

## 2020-01-24 ENCOUNTER — Encounter: Payer: Self-pay | Admitting: Family Medicine

## 2020-01-24 ENCOUNTER — Other Ambulatory Visit: Payer: Self-pay

## 2020-01-24 ENCOUNTER — Ambulatory Visit: Payer: Medicare Other | Admitting: Family Medicine

## 2020-01-24 ENCOUNTER — Ambulatory Visit (INDEPENDENT_AMBULATORY_CARE_PROVIDER_SITE_OTHER): Payer: Medicare Other

## 2020-01-24 ENCOUNTER — Telehealth: Payer: Self-pay | Admitting: Family Medicine

## 2020-01-24 ENCOUNTER — Encounter: Payer: Self-pay | Admitting: *Deleted

## 2020-01-24 VITALS — BP 130/70 | HR 80 | Ht 61.0 in | Wt 235.2 lb

## 2020-01-24 DIAGNOSIS — R0602 Shortness of breath: Secondary | ICD-10-CM

## 2020-01-24 DIAGNOSIS — I10 Essential (primary) hypertension: Secondary | ICD-10-CM | POA: Diagnosis not present

## 2020-01-24 DIAGNOSIS — E782 Mixed hyperlipidemia: Secondary | ICD-10-CM | POA: Diagnosis not present

## 2020-01-24 DIAGNOSIS — N1832 Chronic kidney disease, stage 3b: Secondary | ICD-10-CM

## 2020-01-24 DIAGNOSIS — I2510 Atherosclerotic heart disease of native coronary artery without angina pectoris: Secondary | ICD-10-CM

## 2020-01-24 LAB — ECHOCARDIOGRAM COMPLETE
Height: 61 in
Weight: 3763.2 oz

## 2020-01-24 MED ORDER — FUROSEMIDE 20 MG PO TABS: 20.0000 mg | ORAL_TABLET | Freq: Every day | ORAL | 3 refills | Status: DC

## 2020-01-24 NOTE — Patient Instructions (Addendum)
Medication Instructions:    Your physician has recommended you make the following change in your medication:   Take an extra furosemide 20 mg on Tuesday and Thursday each week  Stop taking tylenol daily. Please take this daily as needed  Continue other medications the same  Labwork:  Your physician recommends that you return for non-fasting lab work in: TODAY to check your BNP, CMET & CBC. This can be done at [facility] or [facility].   Testing/Procedures: Your physician has requested that you have an echocardiogram. Echocardiography is a painless test that uses sound waves to create images of your heart. It provides your doctor with information about the size and shape of your heart and how well your heart's chambers and valves are working. This procedure takes approximately one hour. There are no restrictions for this procedure.  Follow-Up:  Your physician recommends that you schedule a follow-up appointment in: 2-3 weeks (office).  Any Other Special Instructions Will Be Listed Below (If Applicable).  You have been referred to nephrology.  If you need a refill on your cardiac medications before your next appointment, please call your pharmacy.

## 2020-01-24 NOTE — Telephone Encounter (Signed)
Pre-cert Verification for the following procedure    ECHO   (stat)  DATE:  01/24/2020  LOCATION:  [facility]

## 2020-01-25 ENCOUNTER — Telehealth: Payer: Self-pay | Admitting: *Deleted

## 2020-01-25 ENCOUNTER — Encounter: Payer: Self-pay | Admitting: *Deleted

## 2020-01-25 DIAGNOSIS — N1832 Chronic kidney disease, stage 3b: Secondary | ICD-10-CM

## 2020-01-25 DIAGNOSIS — I10 Essential (primary) hypertension: Secondary | ICD-10-CM

## 2020-01-25 DIAGNOSIS — R0602 Shortness of breath: Secondary | ICD-10-CM

## 2020-01-25 MED ORDER — POTASSIUM CHLORIDE ER 10 MEQ PO TBCR: 10.0000 meq | EXTENDED_RELEASE_TABLET | Freq: Every day | ORAL | 0 refills | Status: DC

## 2020-01-25 NOTE — Telephone Encounter (Signed)
-----   Message from Verta Ellen., NP sent at 01/25/2020 10:52 AM EDT ----- Please call the patient and tell her the echocardiogram showed the pumping function of her heart has decreased some compared to the previous one done in 2008. Her heart muscle is a little stiff and has trouble relaxing. Tell her the increased shortness of breath is likely a combination of her heart disease from previous heart attacks, her history of smoking and lung disease, her advanced age and not being very active.  It could also be from advancing heart disease. ( She's had  2 MI's in the past with stents) She had a previous pulmonary function test and the results showed she had moderate obstructive disease and  severely reduced diffusion capacity of carbon monoxide a while back. This means the oxygen and carbon dioxide across the pulmonary capillaries where the gas exchange takes is very compromised most likely from smoking but could be previous damage from Pulmonary embolism's she has had in the past or a combination of both. She also has renal disease and this could be contributing. Tell her we could refer her to pulmonology and have him evaluate her pulmonary status and see if she needs home oxygen. We could also get a Lexiscan stress test and see if she has ischemia.

## 2020-01-25 NOTE — Telephone Encounter (Signed)
Daughter and patient informed. Advised that this can be discussed at her appointment next week. Verbalized understanding of plan. Copy sent to PCP

## 2020-01-25 NOTE — Telephone Encounter (Signed)
-----   Message from Merlene Laughter, RN sent at 01/25/2020 12:21 PM EDT ----- Her BNP is 1100. Tell her to take 40 mg twice a day for the next 4 days. If she continues to be SOB she needs to go to [facility]. She appears to be in heart failure. I dont know if the Lasix orally is going to help her too much but tell her to try that. Tell her if she becomes more SOB she needs to go to [facility]. She needs low dose potassium at least 10 MeQ daily, She needs to follow up in a week if she doesn't have to go to the hospital. She needs a BMET and MAG in a week. She needs to start weighing herself daily  and keep a log so we can keep track of her weights. Tell her to restrict her fluid intake to 60 onces per day. Restrict her salt intake. Can we get a follow up in a week ?

## 2020-01-28 NOTE — Progress Notes (Addendum)
Cardiology Office Note  Date: 01/29/2020   ID: [name], DOB 11/07/1930, MRN [id number]  PCP:  Alison Beard, MD  Cardiologist:  Alison Lesches, MD Electrophysiologist:  None   Chief Complaint: F/U CAD , HTN, HLD   History of Present Illness: Alison [name] is a 86 y.o. female with a history of CAD, HTN, HLD, COPD, chronic anemia, recurrent DVT/PE status post IVC filter previously on Coumadin.  Last visit with Dr. Domenic [name] via telemedicine on November 12, 2019.  She had no complaints of active anginal symptoms.  Blood pressure was well controlled.  She remained on statin therapy and interval lab work from PCP was requested.  On recent visit 01/24/2020 she was having increasing shortness of breath, worse when exerting.  Patient stated  when going from the exam table back to her wheelchair after having her EKG done she was having some significant shortness of breath.  She stated when she does almost anything that requires exertion she becomes more short of breath.  States she was having some increase in swelling in her lower extremities.  Her weight had increased approximately 11 pounds since November 12, 2019.  She denied any recent acute illnesses, hospitalizations, surgeries.  Denied any anginal symptoms.  She stated she had been taking acetaminophen chronically 650 mg p.o. twice daily.  Her renal function had worsened and may be related to chronic use of Tylenol.  She has never seen a nephrologist.  She continues taking Lasix 20 mg a day.  Her most recent renal function studies at PCP office; creatinine of 1.85 and GFR of 28.  On 01/24/2020.   Echo; EF was 45 to 50%, mild LVH, grade 1 DD, left atrium was severely dilated, mild mitral valve regurgitation, mild pulmonary hypertension with PASP of 39 mmHg.  Previous nuclear stress test in 2017 showed EF of 59%. Previous echo in 2008 EF 55-65%.  BNP was 1100.  She was started on Lasix 40 mg p.o. twice daily for 1 week.   Continues  to complain of shortness of breath on minimal activity.  She has lost approximately 4 pounds since starting Lasix 40 mg p.o. twice daily.  She states she has noticed no major changes / improvement in her shortness of breath since starting the increased dose of Lasix.   Past Medical History:  Diagnosis Date  . Chronic anemia   . Chronic back pain   . COPD (chronic obstructive pulmonary disease) ([place])   . Coronary atherosclerosis of native coronary artery    Occluded circ with collaterals and 90% RCA (previous stents at [facility], details not clear), LVEF 48% 1/11 - inferior and inferolateral scar by Myoview 1/11,   . Gout   . History of blood transfusion   . History of pneumonia   . Hyperlipidemia   . Hypertension   . Myocardial infarction ([place]) 1997  . Nocturia   . Osteoarthritis   . Pulmonary embolism ([facility])    History of recurrent DVT and PE status post IVC filter, previously on Coumadin    Past Surgical History:  Procedure Laterality Date  . ABDOMINAL HYSTERECTOMY  1965   partial  . APPENDECTOMY    . BACK SURGERY    . cataract surgery Bilateral   . CORONARY ANGIOPLASTY  1997   3 stents  . cyst removed from back  2006  . LAMINECTOMY WITH POSTERIOR LATERAL ARTHRODESIS LEVEL 1 N/A 09/10/2016   Procedure: Posterior Lateral Fusion - Lumbar four-Lumbar five, lumbar laminectomy and pedicle screw  fixation  - Lumbar four-Lumbar five;  Surgeon: Alison Moore, MD;  Location: [facility];  Service: Neurosurgery;  Laterality: N/A;  . Left adrenalectomy  2009  . Right breast biopsy  2008  . Right knee replacement  2011    Current Outpatient Medications  Medication Sig Dispense Refill  . ADVAIR DISKUS 250-50 MCG/DOSE AEPB Inhale 1 Inhaler into the lungs 2 (two) times daily.     Marland Kitchen allopurinol (ZYLOPRIM) 100 MG tablet Take 100 mg by mouth daily.    . clopidogrel (PLAVIX) 75 MG tablet Take 1 tablet (75 mg total) by mouth daily. 90 tablet 3  . furosemide (LASIX) 20 MG tablet Take 1 tablet (20 mg total)  by mouth daily. Take an extra tablet on Tuesday and Thursdays 100 tablet 3  . losartan (COZAAR) 25 MG tablet Take 1 tablet (25 mg total) by mouth daily. 90 tablet 3  . metoprolol succinate (TOPROL XL) 25 MG 24 hr tablet Take 1 tablet (25 mg total) by mouth daily. 90 tablet 3  . rosuvastatin (CRESTOR) 10 MG tablet Take 1 tablet (10 mg total) by mouth daily. 90 tablet 3  . acetaminophen (TYLENOL) 650 MG CR tablet Take 650 mg by mouth 2 (two) times daily.     . Cholecalciferol (VITAMIN D3) 1000 units CAPS Take 1,000 Units by mouth daily.     . ferrous sulfate 325 (65 FE) MG tablet Take 325 mg by mouth daily.     . nitroGLYCERIN (NITROSTAT) 0.4 MG SL tablet Place 0.4 mg under the tongue every 5 (five) minutes as needed for chest pain.      No current facility-administered medications for this visit.   Allergies:  Patient has no known allergies.   Social History: The patient  reports that she quit smoking about 42 years ago. Her smoking use included cigarettes. She smoked 0.20 packs per day. She has never used smokeless tobacco. She reports that she does not drink alcohol or use drugs.   Family History: The patient's family history includes Stroke in her father.   ROS:  Please see the history of present illness. Otherwise, complete review of systems is positive for none.  All other systems are reviewed and negative.   Physical Exam: VS:  BP (!) 150/70   Pulse 80   Ht 5\' 1"  (1.549 m)   Wt 229 lb 9.6 oz (104.1 kg)   SpO2 92%   BMI 43.38 kg/m , BMI Body mass index is 43.38 kg/m.  Wt Readings from Last 3 Encounters:  01/29/20 229 lb 9.6 oz (104.1 kg)  01/24/20 235 lb 3.2 oz (106.7 kg)  11/12/19 224 lb (101.6 kg)    General: Patient appears comfortable at rest. Neck: Supple, no elevated JVP or carotid bruits, no thyromegaly. Lungs: Clear to auscultation, nonlabored breathing at rest. Cardiac: Regular rate and rhythm, no S3 or significant systolic murmur, no pericardial rub. Extremities:  No pitting edema, distal pulses 2+. Skin: Warm and dry. Musculoskeletal: No kyphosis. Neuropsychiatric: Alert and oriented x3, affect grossly appropriate.  ECG:  An ECG dated 01/24/2020 was personally reviewed today and demonstrated:  Normal sinus rhythm with sinus arrhythmia rate of 83, left axis deviation, septal infarct age undetermined.  Recent Labwork: No results found for requested labs within last 8760 hours.  No results found for: CHOL, TRIG, HDL, CHOLHDL, VLDL, LDLCALC, LDLDIRECT  Other Studies Reviewed Today:  Echocardiogram 01/24/2020  1. Left ventricular ejection fraction, by estimation, is 45 to 50%. The left ventricle has normal function.  The left ventricle has no regional wall motion abnormalities. There is mild left ventricular hypertrophy. Left ventricular diastolic parameters are consistent with Grade I diastolic dysfunction (impaired relaxation). Elevated left atrial pressure. 2. Right ventricular systolic function is normal. The right ventricular size is normal. There is moderately elevated pulmonary artery systolic pressure. 3. Left atrial size was severely dilated. 4. Right atrial size was mildly dilated. 5. The mitral valve is normal in structure. Mild mitral valve regurgitation. No evidence of mitral stenosis. 6. The aortic valve is tricuspid. Aortic valve regurgitation is not visualized. No aortic stenosis is present. 7. Mild pulmonary HTN, PASP is 39 mmHg. 8. The inferior vena cava is normal in size with greater than 50% respiratory variability, suggesting right atrial pressure of 3 mmHg. Comparison(s): Nuclear stress test done 07/07/16 showed an EF of 59%.  Lexiscan Myoview 07/07/2016:  There was no ST segment deviation noted during stress.  Defect 1: There is a medium defect of moderate severity present in the basal inferior, basal inferolateral, mid inferior, mid inferolateral and apical inferior location.  While this may represent myocardial scar,  significant amount of soft tissue attenuation artifact also noted.  This is an intermediate risk study. No evidence of ischemia.  Nuclear stress EF: 59%.  Assessment and Plan:   1. CAD in native artery Patient does not endorse any progressive anginal symptoms but states she Continues with increasing shortness of breath when performing only minimal activity.  (History of MIs x2 with stent placement x3).  Continue Plavix 75 mg, nitroglycerin 0.4 mg sublingual as needed for chest pain, Toprol-XL 25 mg daily  2. Essential hypertension Blood pressure was elevated on arrival at 150/70.   She does not take her blood pressures at home.  She states her blood pressure is always good at primary care visits.  Advised patient to purchase a blood pressure cuff and measure her blood pressure at least once a day and to call us if sustained blood pressures greater than 130/80.  As of today patient has not purchased a blood pressure cuff.  Please increase dose of losartan to 50 mg daily.  3. Mixed hyperlipidemia Lipid levels on 09/19/2019: TC 120, HDL 5100, TG 80, LDL 53   Continue Crestor 10 mg daily.  4. CKD IIIb Patient has had decreased renal function which appears to be progressively worsening over time.  Recent creatinine and GFR at PCP office were 1.85 and 28 respectively. 01/24/2020 creatinine 1.35, GFR 45.  Please refer patient to nephrology Dr. Theador Hawthorne.  Patient states she has been taking daily dose of acetaminophen 1300 mg chronically over time.  Advised patient this could affect her kidney function and only to take the medication for episodic back pain.  Advised her this is not a medicine that you take chronically.  5.  Dyspnea on exertion /chest pressure/pain/combined systolic/diastolic heart failure At last visit Lasix was increased to 40 mg p.o. twice daily.  Her BNP was 1103.  Patient has lost approximately 4 pounds since starting the increased dosage.  She states she can tell no difference in her  dyspnea on exertion.  Her DOE is likely an anginal equivalent.  Her echocardiogram on 01/24/2020 showed EF of 45 to 50%, mild LVH, grade 1 diastolic dysfunction, severely dilated left atrium, mild mitral valve regurgitation, mild pulmonary hypertension with PASP of 39mmHg.  We will get a Lexiscan Myoview stress test to see if decreased EF is ischemic in origin.  Continue Lasix 40 mg p.o. twice daily.  May take  an extra dose of 20 mg as needed.     Medication Adjustments/Labs and Tests Ordered: Current medicines are reviewed at length with the patient today.  Concerns regarding medicines are outlined above.   Disposition: Follow-up with Dr Alison [name] or APP 1 month  Signed, Levell July, NP 01/29/2020 2:35 PM    [facility] at [address], [place], [place] [address] Phone: [phone]; Fax: [phone]

## 2020-01-29 ENCOUNTER — Telehealth: Payer: Self-pay | Admitting: *Deleted

## 2020-01-29 ENCOUNTER — Other Ambulatory Visit: Payer: Self-pay

## 2020-01-29 ENCOUNTER — Encounter: Payer: Self-pay | Admitting: Family Medicine

## 2020-01-29 ENCOUNTER — Encounter: Payer: Self-pay | Admitting: *Deleted

## 2020-01-29 ENCOUNTER — Telehealth: Payer: Self-pay | Admitting: Family Medicine

## 2020-01-29 ENCOUNTER — Ambulatory Visit (INDEPENDENT_AMBULATORY_CARE_PROVIDER_SITE_OTHER): Payer: Medicare Other | Admitting: Family Medicine

## 2020-01-29 VITALS — BP 150/70 | HR 80 | Ht 61.0 in | Wt 229.6 lb

## 2020-01-29 DIAGNOSIS — I5030 Unspecified diastolic (congestive) heart failure: Secondary | ICD-10-CM

## 2020-01-29 DIAGNOSIS — R0602 Shortness of breath: Secondary | ICD-10-CM

## 2020-01-29 DIAGNOSIS — I2510 Atherosclerotic heart disease of native coronary artery without angina pectoris: Secondary | ICD-10-CM

## 2020-01-29 DIAGNOSIS — I10 Essential (primary) hypertension: Secondary | ICD-10-CM

## 2020-01-29 DIAGNOSIS — E782 Mixed hyperlipidemia: Secondary | ICD-10-CM | POA: Diagnosis not present

## 2020-01-29 DIAGNOSIS — R079 Chest pain, unspecified: Secondary | ICD-10-CM

## 2020-01-29 DIAGNOSIS — I5042 Chronic combined systolic (congestive) and diastolic (congestive) heart failure: Secondary | ICD-10-CM | POA: Insufficient documentation

## 2020-01-29 MED ORDER — LOSARTAN POTASSIUM 50 MG PO TABS: 50.0000 mg | ORAL_TABLET | Freq: Every day | ORAL | 3 refills | Status: DC

## 2020-01-29 NOTE — Telephone Encounter (Signed)
°  Precert needed for:  Lexiscan   Location: [facility]    Date: Feb 15, 2020

## 2020-01-29 NOTE — Patient Instructions (Signed)
Your physician recommends that you schedule a follow-up appointment in: [place] physician recommends that you continue on your current medications as directed. Please refer to the Current Medication list given to you today.  CONTINUE LASIX 20 MG TWICE DAILY - CAN TAKE EXTRA 20 MG AS NEEDED   Your physician has requested that you have a lexiscan myoview. For further information please visit [email or web address]. Please follow instruction sheet, as given.  Thank you for choosing [facility]!!

## 2020-01-29 NOTE — Telephone Encounter (Signed)
Patient informed and verbalized understanding of plan. 

## 2020-01-30 NOTE — Telephone Encounter (Signed)
I basically wanted to make sure her kidney function didn't get any worse when we increased the dose of Lasix. I thought I told them. At one point last year it had gotten worse but was improved when we did those labs at her last visit. We can hold of until after the stress test is done and she follows up. I think she has a follow up in 4 weeks.

## 2020-01-31 MED ORDER — ISOSORBIDE MONONITRATE ER 30 MG PO TB24: 30.0000 mg | ORAL_TABLET | Freq: Every day | ORAL | 3 refills | Status: DC

## 2020-01-31 NOTE — Telephone Encounter (Signed)
Per Katina Dung NP, Wednesday, 01/30/20 @1 :27 pm--Hey [name]. Can you send in Imdur 30 mg daily on her and tell her to start it ? Thanks

## 2020-01-31 NOTE — Telephone Encounter (Signed)
Patient informed an verbalized understanding of plan.

## 2020-02-07 ENCOUNTER — Telehealth: Payer: Self-pay

## 2020-02-07 MED ORDER — FUROSEMIDE 20 MG PO TABS: 20.0000 mg | ORAL_TABLET | Freq: Two times a day (BID) | ORAL | 2 refills | Status: DC

## 2020-02-07 MED ORDER — POTASSIUM CHLORIDE ER 10 MEQ PO TBCR
10.0000 meq | EXTENDED_RELEASE_TABLET | Freq: Every day | ORAL | 3 refills | Status: DC
Start: 2020-02-07 — End: 2020-12-01

## 2020-02-07 NOTE — Telephone Encounter (Signed)
Patient informed and verbalized understanding of plan. Lab orders faxed to [facility]

## 2020-02-07 NOTE — Telephone Encounter (Signed)
Patient advised that her losartan was increased to 50 mg daily. Advised that she could take (2) of her 25 mg tablets daily until they are finished. Advised that imdur 30 mg was added in addition to her other medications.   Will send to [facility] NP to clarify furosemide dose and directions.

## 2020-02-07 NOTE — Telephone Encounter (Signed)
Pt called into office wanting to clarify if Levell July, NP wanted her to take both the Imdur and the Losartan, and wanted to clarify what dosage of Lasix she should currently be taking.  Please call pt back and advise. Thanks.

## 2020-02-07 NOTE — Telephone Encounter (Signed)
Contacted patient to confirm her current dose of furosemide Reports taking lasix 20 mg daily since increased of 40 mg BID for 4 days.  Reports SOB is a little better and only have SOB with activity Advised will discuss with Jonni Sanger NP to clarify dosage

## 2020-02-07 NOTE — Telephone Encounter (Addendum)
Per Jonni Sanger, take furosemide 20 mg BID and one tablet daily as needed for swelling or SOB with Potassium 10 meq daily, get BMET, Mg in 2 weeks, weigh daily

## 2020-02-07 NOTE — Telephone Encounter (Signed)
Imdur dose should stay the same..  Lasix dose should be 40 twice a day.  Yes she can take to her of her 25 mg low doses of losartan 1 in a.m. and 1 in p.m. until she runs out of her current medications and then she can start the 50 mg 1 pill/day

## 2020-02-15 ENCOUNTER — Ambulatory Visit (HOSPITAL_BASED_OUTPATIENT_CLINIC_OR_DEPARTMENT_OTHER)
Admission: RE | Admit: 2020-02-15 | Discharge: 2020-02-15 | Disposition: A | Discharge status: Home / Self Care | Payer: Medicare Other | Source: Ambulatory Visit | Attending: Family Medicine | Admitting: Family Medicine

## 2020-02-15 ENCOUNTER — Ambulatory Visit (HOSPITAL_COMMUNITY)
Admission: RE | Admit: 2020-02-15 | Discharge: 2020-02-15 | Disposition: A | Discharge status: Home / Self Care | Payer: Medicare Other | Source: Ambulatory Visit | Attending: Family Medicine | Admitting: Family Medicine

## 2020-02-15 ENCOUNTER — Other Ambulatory Visit: Payer: Self-pay

## 2020-02-15 DIAGNOSIS — R079 Chest pain, unspecified: Secondary | ICD-10-CM | POA: Diagnosis not present

## 2020-02-15 LAB — NM MYOCAR MULTI W/SPECT W/WALL MOTION / EF
LV dias vol: 102 mL (ref 46–106)
LV sys vol: 62 mL
Peak HR: 85 {beats}/min
RATE: 0.42
Rest HR: 67 {beats}/min
SDS: 1
SRS: 18
SSS: 19
TID: 0.99

## 2020-02-15 MED ORDER — REGADENOSON 0.4 MG/5ML IV SOLN
INTRAVENOUS | Status: AC
  Administered 2020-02-15: 12:00:00 0.4 mg via INTRAVENOUS
  Filled 2020-02-15: qty 5

## 2020-02-15 MED ORDER — TECHNETIUM TC 99M TETROFOSMIN IV KIT
30.0000 | PACK | Freq: Once | INTRAVENOUS | Status: AC | PRN
  Administered 2020-02-15: 31.7 via INTRAVENOUS

## 2020-02-15 MED ORDER — SODIUM CHLORIDE FLUSH 0.9 % IV SOLN
INTRAVENOUS | Status: AC
  Administered 2020-02-15: 10 mL via INTRAVENOUS
  Filled 2020-02-15: qty 10

## 2020-02-15 MED ORDER — TECHNETIUM TC 99M TETROFOSMIN IV KIT
10.0000 | PACK | Freq: Once | INTRAVENOUS | Status: AC | PRN
  Administered 2020-02-15: 9.99 via INTRAVENOUS

## 2020-02-19 ENCOUNTER — Telehealth: Payer: Self-pay | Admitting: *Deleted

## 2020-02-19 NOTE — Telephone Encounter (Signed)
-----   Message from Verta Ellen., NP sent at 02/17/2020  7:39 PM EDT ----- Please call patient and let her know that the stress test did not show any enlarged area of lack of blood flow.  She does have a defect but she has had prior MIs in the past.  The area is consistent with scar.  Thank you

## 2020-02-19 NOTE — Telephone Encounter (Signed)
Patient informed. Copy sent to PCP °

## 2020-02-27 NOTE — Progress Notes (Signed)
Cardiology Office Note  Date: 02/28/2020   ID: Alison [name], DOB [birth year], MRN [id number]  PCP:  [name], MD  Cardiologist:  Rozann Lesches, MD Electrophysiologist:  None   Chief Complaint: F/U CAD, HFpEF, HTN, HLD, COPD, chronic anemia, recurrent DVT/PE status post IVC filter previously on Coumadin.     History of Present Illness: Alison [name] is a 86 y.o. female with a history of CAD, HFpEF, HTN, HLD, COPD, chronic anemia, recurrent DVT/PE status post IVC filter previously on Coumadin.  Last visit with Dr. Domenic Polite via telemedicine on November 12, 2019.  She had no complaints of active anginal symptoms.  Blood pressure was well controlled.  She remained on statin therapy and interval lab work from PCP was requested.  On recent visit 01/24/2020 she was having increasing shortness of breath, worse when exerting.  Patient stated when going from the exam table back to her wheelchair after having her EKG done she was having some significant shortness of breath.  She stated when she does almost anything that requires exertion she becomes more short of breath.  States she was having some increase in swelling in her lower extremities.  Her weight had increased approximately 11 pounds since November 12, 2019.  She denied any recent acute illnesses, hospitalizations, surgeries.  Denied any anginal symptoms.  She stated she had been taking acetaminophen chronically 650 mg p.o. twice daily.  Her renal function had worsened and may be related to chronic use of Tylenol.  She has never seen a nephrologist.  She continues taking Lasix 20 mg a day.  Her most recent renal function studies at PCP office; creatinine of 1.85 and GFR of 28.  On 01/24/2020.   Echo; EF was 45 to 50%, mild LVH, grade 1 DD, left atrium was severely dilated, mild mitral valve regurgitation, mild pulmonary hypertension with PASP of 39 mmHg.  Previous nuclear stress test in 2017 showed EF of 59%. Previous echo in  2008 EF 55-65%.  BNP was 1100.  She was started on Lasix 40 mg p.o. twice daily for 1 week.    Patient states her breathing is about the same.  She states her lower extremity edema seems to be a little better.  She has lost up to 3 pounds.  Her weight at last visit was 229.  Today she weighs 226 pounds.  She has no dyspnea at rest.  She states it only happens when he is performing some exertional activity.  She has been taking Lasix 20 mg/day + as needed dose of 20 pretty much every day.  He denies any anginal symptoms.  Results of echo reviewed with patient and daughter.  They both verbalized understanding.   Past Medical History:  Diagnosis Date  . Chronic anemia   . Chronic back pain   . COPD (chronic obstructive pulmonary disease) ([place])   . Coronary atherosclerosis of native coronary artery    Occluded circ with collaterals and 90% RCA (previous stents at [facility], details not clear), LVEF 48% 1/11 - inferior and inferolateral scar by Myoview 1/11,   . Gout   . History of blood transfusion   . History of pneumonia   . Hyperlipidemia   . Hypertension   . Myocardial infarction ([place]) 1997  . Nocturia   . Osteoarthritis   . Pulmonary embolism ([facility])    History of recurrent DVT and PE status post IVC filter, previously on Coumadin    Past Surgical History:  Procedure Laterality Date  .  ABDOMINAL HYSTERECTOMY  1965   partial  . APPENDECTOMY    . BACK SURGERY    . cataract surgery Bilateral   . CORONARY ANGIOPLASTY  1997   3 stents  . cyst removed from back  2006  . LAMINECTOMY WITH POSTERIOR LATERAL ARTHRODESIS LEVEL 1 N/A 09/10/2016   Procedure: Posterior Lateral Fusion - Lumbar four-Lumbar five, lumbar laminectomy and pedicle screw fixation  - Lumbar four-Lumbar five;  Surgeon: Eustace Moore, MD;  Location: [place];  Service: Neurosurgery;  Laterality: N/A;  . Left adrenalectomy  2009  . Right breast biopsy  2008  . Right knee replacement  2011    Current Outpatient Medications    Medication Sig Dispense Refill  . acetaminophen (TYLENOL) 650 MG CR tablet Take 650 mg by mouth 2 (two) times daily.     Marland Kitchen ADVAIR DISKUS 250-50 MCG/DOSE AEPB Inhale 1 Inhaler into the lungs 2 (two) times daily.     Marland Kitchen allopurinol (ZYLOPRIM) 100 MG tablet Take 100 mg by mouth daily.    . Calcium Carb-Cholecalciferol (CALCIUM 600+D3 PO) Take 1 tablet by mouth daily.    . clopidogrel (PLAVIX) 75 MG tablet Take 1 tablet (75 mg total) by mouth daily. 90 tablet 3  . ferrous sulfate 325 (65 FE) MG tablet Take 325 mg by mouth daily.     . furosemide (LASIX) 20 MG tablet Take 1 tablet (20 mg total) by mouth 2 (two) times daily. May take one extra tablet daily as needed for swelling or SOB 270 tablet 2  . isosorbide mononitrate (IMDUR) 30 MG 24 hr tablet Take 1 tablet (30 mg total) by mouth daily. 90 tablet 3  . losartan (COZAAR) 50 MG tablet Take 1 tablet (50 mg total) by mouth daily. 90 tablet 3  . metoprolol succinate (TOPROL XL) 25 MG 24 hr tablet Take 1 tablet (25 mg total) by mouth daily. 90 tablet 3  . nitroGLYCERIN (NITROSTAT) 0.4 MG SL tablet Place 0.4 mg under the tongue every 5 (five) minutes as needed for chest pain.     . potassium chloride (KLOR-CON) 10 MEQ tablet Take 1 tablet (10 mEq total) by mouth daily. 90 tablet 3  . rosuvastatin (CRESTOR) 10 MG tablet Take 1 tablet (10 mg total) by mouth daily. 90 tablet 3   No current facility-administered medications for this visit.   Allergies:  Patient has no known allergies.   Social History: The patient  reports that she quit smoking about 42 years ago. Her smoking use included cigarettes. She smoked 0.20 packs per day. She has never used smokeless tobacco. She reports that she does not drink alcohol or use drugs.   Family History: The patient's family history includes Stroke in her father.   ROS:  Please see the history of present illness. Otherwise, complete review of systems is positive for none.  All other systems are reviewed and  negative.   Physical Exam: VS:  Ht 5\' 1"  (1.549 m)   Wt 226 lb 3.2 oz (102.6 kg)   BMI 42.74 kg/m , BMI Body mass index is 42.74 kg/m.  Wt Readings from Last 3 Encounters:  02/28/20 226 lb 3.2 oz (102.6 kg)  01/29/20 229 lb 9.6 oz (104.1 kg)  01/24/20 235 lb 3.2 oz (106.7 kg)    General: Patient appears comfortable at rest. Neck: Supple, no elevated JVP or carotid bruits, no thyromegaly. Lungs: Clear to auscultation, nonlabored breathing at rest. Cardiac: Regular rate and rhythm, no S3 or significant systolic murmur, no  pericardial rub. Extremities: Mild pitting edema bilaterally distal pulses 2+. Skin: Warm and dry. Musculoskeletal: No kyphosis. Neuropsychiatric: Alert and oriented x3, affect grossly appropriate.  ECG:    Recent Labwork: No results found for requested labs within last 8760 hours.  No results found for: CHOL, TRIG, HDL, CHOLHDL, VLDL, LDLCALC, LDLDIRECT  Other Studies Reviewed Today:  MPI stress test 02/15/2020  No diagnostic ST segment changes to indicate ischemia.  Medium sized, severe intensity, fixed defect involving the apical anteroseptal wall, and more predominantly the entire inferolateral wall. These regions are consistent with scar, no large ischemic territory.  This is an intermediate risk study.  Nuclear stress EF: 40%  Echocardiogram 01/24/2020  1. Left ventricular ejection fraction, by estimation, is 45 to 50%. The left ventricle has normal function. The left ventricle has no regional wall motion abnormalities. There is mild left ventricular hypertrophy. Left ventricular diastolic parameters are consistent with Grade I diastolic dysfunction (impaired relaxation). Elevated left atrial pressure. 2. Right ventricular systolic function is normal. The right ventricular size is normal. There is moderately elevated pulmonary artery systolic pressure. 3. Left atrial size was severely dilated. 4. Right atrial size was mildly dilated. 5. The  mitral valve is normal in structure. Mild mitral valve regurgitation. No evidence of mitral stenosis. 6. The aortic valve is tricuspid. Aortic valve regurgitation is not visualized. No aortic stenosis is present. 7. Mild pulmonary HTN, PASP is 39 mmHg. 8. The inferior vena cava is normal in size with greater than 50% respiratory variability, suggesting right atrial pressure of 3 mmHg. Comparison(s): Nuclear stress test done 07/07/16 showed an EF of 59%.  Lexiscan Myoview 07/07/2016:  There was no ST segment deviation noted during stress.  Defect 1: There is a medium defect of moderate severity present in the basal inferior, basal inferolateral, mid inferior, mid inferolateral and apical inferior location.  While this may represent myocardial scar, significant amount of soft tissue attenuation artifact also noted.  This is an intermediate risk study. No evidence of ischemia.  Nuclear stress EF: 59%.  Assessment and Plan:  1. CAD in native artery   2. Essential hypertension   3. Mixed hyperlipidemia   4. Stage 3b chronic kidney disease   5. SOB (shortness of breath)    1. CAD in native artery No anginal symptoms.  Continue Plavix 75 mg, Imdur 30 mg daily.  Nitroglycerin 0.4 mg sublingual as needed.  2. Essential hypertension Blood pressure is well controlled on current medications.  Continue losartan 50 mg daily.  3. Mixed hyperlipidemia Lipid panel 09/18/2019 total cholesterol 120, HDL 51, triglycerides 80, LDL 53.  Continue Crestor 10 mg daily.  4. Stage 3b chronic kidney disease Crt. 1.35, GFR 45 on 01/24/2020    5. SOB (shortness of breath)/diastolic heart failure Recent echo shows EF 45 to 50%.  Mild LVH, grade 1 diastolic dysfunction, mild mitral regurgitation.  Mild pulmonary hypertension.  Increase Lasix to 40 mg p.o. daily for 2 weeks.  Get a BMP and magnesium in 2 weeks.  Have patient call back with a log of weights and blood pressures and how she is feeling after the  increased dosage.  Continue Toprol-XL 25 daily  Medication Adjustments/Labs and Tests Ordered: Current medicines are reviewed at length with the patient today.  Concerns regarding medicines are outlined above.   Disposition: Follow-up with Dr. Domenic Polite or APP in 3 months  Signed, Levell July, NP 02/28/2020 1:08 PM    [facility]  [address], [place] [address] Phone: [phone]; Fax: [phone]

## 2020-02-28 ENCOUNTER — Other Ambulatory Visit: Payer: Self-pay

## 2020-02-28 ENCOUNTER — Encounter: Payer: Self-pay | Admitting: Family Medicine

## 2020-02-28 ENCOUNTER — Ambulatory Visit (INDEPENDENT_AMBULATORY_CARE_PROVIDER_SITE_OTHER): Payer: Medicare Other | Admitting: Family Medicine

## 2020-02-28 VITALS — BP 128/74 | HR 65 | Ht 61.0 in | Wt 226.2 lb

## 2020-02-28 DIAGNOSIS — R0602 Shortness of breath: Secondary | ICD-10-CM

## 2020-02-28 DIAGNOSIS — N1832 Chronic kidney disease, stage 3b: Secondary | ICD-10-CM | POA: Diagnosis not present

## 2020-02-28 DIAGNOSIS — I10 Essential (primary) hypertension: Secondary | ICD-10-CM | POA: Diagnosis not present

## 2020-02-28 DIAGNOSIS — E782 Mixed hyperlipidemia: Secondary | ICD-10-CM

## 2020-02-28 DIAGNOSIS — I2510 Atherosclerotic heart disease of native coronary artery without angina pectoris: Secondary | ICD-10-CM | POA: Diagnosis not present

## 2020-02-28 NOTE — Patient Instructions (Addendum)
Medication Instructions:   Increase Lasix to 40mg  twice a day x 2 weeks, then back to previous dosing.   Continue all other medications.    Labwork:  BMET, Magnesium - orders given today.   Please do in 2 weeks.  Office will contact with results via phone or letter.    Testing/Procedures: none  Follow-Up: 3 months   Any Other Special Instructions Will Be Listed Below (If Applicable). Keep log of weights over the next 2 weeks & call office with readings.    If you need a refill on your cardiac medications before your next appointment, please call your pharmacy.

## 2020-03-13 DIAGNOSIS — Z0000 Encounter for general adult medical examination without abnormal findings: Secondary | ICD-10-CM | POA: Diagnosis not present

## 2020-03-18 ENCOUNTER — Telehealth: Payer: Self-pay | Admitting: *Deleted

## 2020-03-19 NOTE — Telephone Encounter (Signed)
Her creatinine hs increased and GFR has decreased . Need to cut back on the lasix to 20 mg twice a day from the 40 mg twice a day and recheck her renal function in a couple of weeks after the change. We probably need to refer her to Dr Theador Hawthorne for evaluation  and management of  her renal disease. Thank You

## 2020-03-20 MED ORDER — FUROSEMIDE 20 MG PO TABS: 20.0000 mg | ORAL_TABLET | Freq: Two times a day (BID) | ORAL | 1 refills | Status: DC

## 2020-03-20 NOTE — Telephone Encounter (Signed)
Reports taking furosemide 60 mg daily. Reports starting furosemide 20 mg daily today. Reports swelling and sob has improved a little. Says she has a nephrology appointment on April 11, 2020 with Dr. Theador Hawthorne in which we previously referred patient to this provider.

## 2020-03-20 NOTE — Telephone Encounter (Signed)
Per Jonni Sanger, decrease furosemide to 20 mg by mouth twice daily.  Patient informed and verbalized understanding of plan.

## 2020-03-20 NOTE — Telephone Encounter (Signed)
Okay thank you

## 2020-03-28 ENCOUNTER — Other Ambulatory Visit: Payer: Self-pay | Admitting: Cardiology

## 2020-04-11 ENCOUNTER — Other Ambulatory Visit (HOSPITAL_COMMUNITY): Payer: Self-pay | Admitting: Nephrology

## 2020-04-11 ENCOUNTER — Other Ambulatory Visit: Payer: Self-pay | Admitting: Nephrology

## 2020-04-11 DIAGNOSIS — Z79899 Other long term (current) drug therapy: Secondary | ICD-10-CM | POA: Diagnosis not present

## 2020-04-11 DIAGNOSIS — M109 Gout, unspecified: Secondary | ICD-10-CM | POA: Diagnosis not present

## 2020-04-11 DIAGNOSIS — N1832 Chronic kidney disease, stage 3b: Secondary | ICD-10-CM

## 2020-04-11 DIAGNOSIS — I5032 Chronic diastolic (congestive) heart failure: Secondary | ICD-10-CM | POA: Diagnosis not present

## 2020-04-11 DIAGNOSIS — I129 Hypertensive chronic kidney disease with stage 1 through stage 4 chronic kidney disease, or unspecified chronic kidney disease: Secondary | ICD-10-CM | POA: Diagnosis not present

## 2020-04-18 ENCOUNTER — Ambulatory Visit (HOSPITAL_COMMUNITY)
Admission: RE | Admit: 2020-04-18 | Discharge: 2020-04-18 | Disposition: A | Discharge status: Home / Self Care | Payer: Medicare Other | Source: Ambulatory Visit | Attending: Nephrology | Admitting: Nephrology

## 2020-04-18 ENCOUNTER — Other Ambulatory Visit: Payer: Self-pay

## 2020-04-18 DIAGNOSIS — Z79899 Other long term (current) drug therapy: Secondary | ICD-10-CM | POA: Diagnosis not present

## 2020-04-18 DIAGNOSIS — N281 Cyst of kidney, acquired: Secondary | ICD-10-CM | POA: Diagnosis not present

## 2020-04-18 DIAGNOSIS — E559 Vitamin D deficiency, unspecified: Secondary | ICD-10-CM | POA: Diagnosis not present

## 2020-04-18 DIAGNOSIS — I5032 Chronic diastolic (congestive) heart failure: Secondary | ICD-10-CM | POA: Diagnosis not present

## 2020-04-18 DIAGNOSIS — R809 Proteinuria, unspecified: Secondary | ICD-10-CM | POA: Diagnosis not present

## 2020-04-18 DIAGNOSIS — N1832 Chronic kidney disease, stage 3b: Secondary | ICD-10-CM | POA: Insufficient documentation

## 2020-04-18 DIAGNOSIS — N183 Chronic kidney disease, stage 3 unspecified: Secondary | ICD-10-CM | POA: Diagnosis not present

## 2020-04-18 DIAGNOSIS — I129 Hypertensive chronic kidney disease with stage 1 through stage 4 chronic kidney disease, or unspecified chronic kidney disease: Secondary | ICD-10-CM | POA: Diagnosis not present

## 2020-05-16 DIAGNOSIS — I129 Hypertensive chronic kidney disease with stage 1 through stage 4 chronic kidney disease, or unspecified chronic kidney disease: Secondary | ICD-10-CM | POA: Diagnosis not present

## 2020-05-16 DIAGNOSIS — D631 Anemia in chronic kidney disease: Secondary | ICD-10-CM | POA: Diagnosis not present

## 2020-05-16 DIAGNOSIS — I5032 Chronic diastolic (congestive) heart failure: Secondary | ICD-10-CM | POA: Diagnosis not present

## 2020-05-16 DIAGNOSIS — N184 Chronic kidney disease, stage 4 (severe): Secondary | ICD-10-CM | POA: Diagnosis not present

## 2020-05-16 DIAGNOSIS — E211 Secondary hyperparathyroidism, not elsewhere classified: Secondary | ICD-10-CM | POA: Diagnosis not present

## 2020-05-21 ENCOUNTER — Other Ambulatory Visit: Payer: Self-pay | Admitting: Cardiology

## 2020-05-27 DIAGNOSIS — J449 Chronic obstructive pulmonary disease, unspecified: Secondary | ICD-10-CM | POA: Diagnosis not present

## 2020-05-27 DIAGNOSIS — N184 Chronic kidney disease, stage 4 (severe): Secondary | ICD-10-CM | POA: Diagnosis not present

## 2020-05-27 DIAGNOSIS — I10 Essential (primary) hypertension: Secondary | ICD-10-CM | POA: Diagnosis not present

## 2020-05-28 ENCOUNTER — Encounter (HOSPITAL_COMMUNITY)
Admission: RE | Admit: 2020-05-28 | Discharge: 2020-05-28 | Disposition: A | Discharge status: Home / Self Care | Payer: Medicare Other | Source: Ambulatory Visit | Attending: Nephrology | Admitting: Nephrology

## 2020-05-28 ENCOUNTER — Encounter (HOSPITAL_COMMUNITY): Payer: Self-pay

## 2020-05-28 ENCOUNTER — Other Ambulatory Visit: Payer: Self-pay

## 2020-05-28 DIAGNOSIS — D631 Anemia in chronic kidney disease: Secondary | ICD-10-CM | POA: Insufficient documentation

## 2020-05-28 DIAGNOSIS — N184 Chronic kidney disease, stage 4 (severe): Secondary | ICD-10-CM | POA: Insufficient documentation

## 2020-05-28 LAB — POCT HEMOGLOBIN-HEMACUE: Hemoglobin: 8.6 g/dL — ABNORMAL LOW (ref 12.0–15.0)

## 2020-05-28 MED ORDER — EPOETIN ALFA 3000 UNIT/ML IJ SOLN
INTRAMUSCULAR | Status: AC
  Filled 2020-05-28: qty 1

## 2020-05-28 MED ORDER — EPOETIN ALFA 3000 UNIT/ML IJ SOLN
3000.0000 [IU] | Freq: Once | INTRAMUSCULAR | Status: AC
  Administered 2020-05-28: 3000 [IU] via SUBCUTANEOUS

## 2020-06-11 ENCOUNTER — Encounter (HOSPITAL_COMMUNITY)
Admission: RE | Admit: 2020-06-11 | Discharge: 2020-06-11 | Disposition: A | Discharge status: Home / Self Care | Payer: Medicare Other | Attending: Nephrology | Admitting: Nephrology

## 2020-06-11 ENCOUNTER — Encounter (HOSPITAL_COMMUNITY)
Admission: RE | Admit: 2020-06-11 | Discharge: 2020-06-11 | Disposition: A | Discharge status: Home / Self Care | Payer: Medicare Other | Source: Ambulatory Visit | Attending: Nephrology | Admitting: Nephrology

## 2020-06-11 ENCOUNTER — Other Ambulatory Visit: Payer: Self-pay

## 2020-06-11 DIAGNOSIS — D631 Anemia in chronic kidney disease: Secondary | ICD-10-CM | POA: Diagnosis not present

## 2020-06-11 DIAGNOSIS — N184 Chronic kidney disease, stage 4 (severe): Secondary | ICD-10-CM | POA: Diagnosis not present

## 2020-06-11 LAB — POCT HEMOGLOBIN-HEMACUE: Hemoglobin: 8.6 g/dL — ABNORMAL LOW (ref 12.0–15.0)

## 2020-06-11 MED ORDER — EPOETIN ALFA 3000 UNIT/ML IJ SOLN
3000.0000 [IU] | Freq: Once | INTRAMUSCULAR | Status: AC
  Administered 2020-06-11: 3000 [IU] via SUBCUTANEOUS

## 2020-06-11 MED ORDER — EPOETIN ALFA 3000 UNIT/ML IJ SOLN
INTRAMUSCULAR | Status: AC
  Filled 2020-06-11: qty 1

## 2020-06-11 NOTE — Progress Notes (Signed)
Cardiology Office Note  Date: 06/12/2020   ID: [name], DOB 29-Nov-1930, MRN [id number]  PCP:  Alison Beard, MD  Cardiologist:  Rozann Lesches, MD Electrophysiologist:  None   Chief Complaint  Patient presents with  . Cardiac follow-up    History of Present Illness: Alison [name] is an 86 y.o. female last seen in May by Mr. Alison Sake NP.  She presents today with family member for a follow-up visit.  Reports chronic dyspnea on exertion, although states that leg swelling has improved.  Her weight is down 6 pounds from last assessment.  Echocardiogram in April showed LVEF 45 to 50% range with grade 1 diastolic dysfunction, normal RV contraction, severely dilated left atrium, PASP estimated 39 mmHg, no major valvular abnormalities.  I went over her medications today.  She is taking Lasix at 20 mg daily at this point.  We discussed up titration for weight gain, importance of checking daily weights.  She does not report any nitroglycerin use.  Past Medical History:  Diagnosis Date  . Chronic anemia   . Chronic back pain   . COPD (chronic obstructive pulmonary disease) ([place])   . Coronary atherosclerosis of native coronary artery    Occluded circ with collaterals and 90% RCA (previous stents at [facility], details not clear), LVEF 48% 1/11 - inferior and inferolateral scar by Myoview 1/11,   . Gout   . History of blood transfusion   . History of pneumonia   . Hyperlipidemia   . Hypertension   . Myocardial infarction ([place]) 1997  . Nocturia   . Osteoarthritis   . Pulmonary embolism ([facility])    History of recurrent DVT and PE status post IVC filter, previously on Coumadin    Past Surgical History:  Procedure Laterality Date  . ABDOMINAL HYSTERECTOMY  1965   partial  . APPENDECTOMY    . BACK SURGERY    . cataract surgery Bilateral   . CORONARY ANGIOPLASTY  1997   3 stents  . cyst removed from back  2006  . LAMINECTOMY WITH POSTERIOR LATERAL ARTHRODESIS LEVEL 1 N/A  09/10/2016   Procedure: Posterior Lateral Fusion - Lumbar four-Lumbar five, lumbar laminectomy and pedicle screw fixation  - Lumbar four-Lumbar five;  Surgeon: Eustace Moore, MD;  Location: [facility];  Service: Neurosurgery;  Laterality: N/A;  . Left adrenalectomy  2009  . Right breast biopsy  2008  . Right knee replacement  2011    Current Outpatient Medications  Medication Sig Dispense Refill  . acetaminophen (TYLENOL) 650 MG CR tablet Take 650 mg by mouth 2 (two) times daily.     Marland Kitchen ADVAIR DISKUS 250-50 MCG/DOSE AEPB Inhale 1 Inhaler into the lungs 2 (two) times daily.     Marland Kitchen allopurinol (ZYLOPRIM) 100 MG tablet Take 100 mg by mouth daily.    . calcitRIOL (ROCALTROL) 0.25 MCG capsule Take 0.25 mcg by mouth daily.    . Calcium Carb-Cholecalciferol (CALCIUM 600+D3 PO) Take 1 tablet by mouth daily.    . clopidogrel (PLAVIX) 75 MG tablet TAKE 1 TABLET BY MOUTH  DAILY 90 tablet 3  . epoetin alfa (EPOGEN) 3000 UNIT/ML injection Inject 3,000 Units into the vein every 14 (fourteen) days.    . ferrous sulfate 325 (65 FE) MG tablet Take 325 mg by mouth daily.     . furosemide (LASIX) 20 MG tablet Take 1 tablet (20 mg total) by mouth 2 (two) times daily. (Patient taking differently: Take 20 mg by mouth daily. May take  an extra pill daily as needed for swelling, weight gain) 180 tablet 1  . isosorbide mononitrate (IMDUR) 30 MG 24 hr tablet Take 1 tablet (30 mg total) by mouth daily. 90 tablet 3  . losartan (COZAAR) 50 MG tablet Take 1 tablet (50 mg total) by mouth daily. 90 tablet 3  . metoprolol succinate (TOPROL-XL) 25 MG 24 hr tablet TAKE 1 TABLET BY MOUTH  DAILY 90 tablet 3  . nitroGLYCERIN (NITROSTAT) 0.4 MG SL tablet Place 0.4 mg under the tongue every 5 (five) minutes as needed for chest pain.     . potassium chloride (KLOR-CON) 10 MEQ tablet Take 1 tablet (10 mEq total) by mouth daily. 90 tablet 3  . rosuvastatin (CRESTOR) 10 MG tablet TAKE 1 TABLET BY MOUTH  DAILY 90 tablet 3   No current  facility-administered medications for this visit.   Allergies:  Patient has no known allergies.   ROS:   No palpitations or syncope.  Physical Exam: VS:  BP 124/62   Pulse 68   Ht 5' 1.5" (1.562 m)   Wt 221 lb (100.2 kg)   SpO2 97%   BMI 41.08 kg/m , BMI Body mass index is 41.08 kg/m.  Wt Readings from Last 3 Encounters:  06/12/20 221 lb (100.2 kg)  05/28/20 227 lb 1.2 oz (103 kg)  02/28/20 226 lb 3.2 oz (102.6 kg)    General: Elderly woman in wheelchair. HEENT: Conjunctiva and lids normal, wearing a mask. Neck: Supple, no elevated JVP or carotid bruits, no thyromegaly. Lungs: Clear to auscultation, nonlabored breathing at rest. Cardiac: Regular rate and rhythm, no S3, soft systolic murmur, no pericardial rub. Extremities: Improved leg edema.  ECG:  An ECG dated 01/24/2020 was personally reviewed today and demonstrated:  Sinus rhythm with poor R wave progression anteriorly and left anterior fascicular block.  Recent Labwork: 06/11/2020: Hemoglobin 8.15 April 2020: BUN 37, creatinine 1.9, potassium 5.0, AST 16, ALT 16  Other Studies Reviewed Today:  Echocardiogram 01/24/2020: 1. Left ventricular ejection fraction, by estimation, is 45 to 50%. The  left ventricle has normal function. The left ventricle has no regional  wall motion abnormalities. There is mild left ventricular hypertrophy.  Left ventricular diastolic parameters  are consistent with Grade I diastolic dysfunction (impaired relaxation).  Elevated left atrial pressure.  2. Right ventricular systolic function is normal. The right ventricular  size is normal. There is moderately elevated pulmonary artery systolic  pressure.  3. Left atrial size was severely dilated.  4. Right atrial size was mildly dilated.  5. The mitral valve is normal in structure. Mild mitral valve  regurgitation. No evidence of mitral stenosis.  6. The aortic valve is tricuspid. Aortic valve regurgitation is not  visualized. No aortic  stenosis is present.  7. Mild pulmonary HTN, PASP is 39 mmHg.  8. The inferior vena cava is normal in size with greater than 50%  respiratory variability, suggesting right atrial pressure of 3 mmHg.   Assessment and Plan:  1.  Known occlusion of the circumflex associated with collaterals and previous RCA stent intervention.  Dyspnea on exertion likely component of ischemic heart disease and also chronic combined heart failure.  Plan is to continue medical therapy, she is currently on Toprol-XL, losartan, Imdur, Plavix, and Crestor.  2.  Chronic combined heart failure, weight down and leg swelling has improved.  Recent echocardiogram shows mildly reduced LVEF at 45 to 50%.  Continue Lasix 20 mg daily with potassium supplement, check daily weights.  May  take an additional Lasix dose if weight goes up 2 or 3 pounds in 24 hours.   Medication Adjustments/Labs and Tests Ordered: Current medicines are reviewed at length with the patient today.  Concerns regarding medicines are outlined above.   Tests Ordered: No orders of the defined types were placed in this encounter.   Medication Changes: No orders of the defined types were placed in this encounter.   Disposition:  Follow up 6 months in the [place] office.  Signed, Satira Sark, MD, [facility] 06/12/2020 12:05 PM    [facility] [place], [place],  [address] Phone: [phone]; Fax: [phone]

## 2020-06-12 ENCOUNTER — Ambulatory Visit: Payer: Medicare Other | Admitting: Cardiology

## 2020-06-12 ENCOUNTER — Encounter: Payer: Self-pay | Admitting: Cardiology

## 2020-06-12 VITALS — BP 124/62 | HR 68 | Ht 61.5 in | Wt 221.0 lb

## 2020-06-12 DIAGNOSIS — I25119 Atherosclerotic heart disease of native coronary artery with unspecified angina pectoris: Secondary | ICD-10-CM | POA: Diagnosis not present

## 2020-06-12 DIAGNOSIS — I5042 Chronic combined systolic (congestive) and diastolic (congestive) heart failure: Secondary | ICD-10-CM | POA: Diagnosis not present

## 2020-06-12 DIAGNOSIS — N1832 Chronic kidney disease, stage 3b: Secondary | ICD-10-CM

## 2020-06-12 NOTE — Patient Instructions (Addendum)
Medication Instructions:    Your physician recommends that you continue on your current medications as directed. Please refer to the Current Medication list given to you today.  Labwork:  none  Testing/Procedures:  none  Follow-Up:  Your physician recommends that you schedule a follow-up appointment in: 6 months.   Any Other Special Instructions Will Be Listed Below (If Applicable). Your physician recommends that you weigh, daily, at the same time every day, and in the same amount of clothing. Please record your daily weights on the handout provided and bring it to your next appointment.  If you need a refill on your cardiac medications before your next appointment, please call your pharmacy. Daily Weight Record It is important to weigh yourself daily. To do this:  Make sure you use a reliable scale. Use the same scale each day.  Keep this daily weight chart near your scale.  Weigh yourself each morning at the same time.  Before weighing yourself: ? Take off your shoes. ? Make sure you are wearing the same amount of clothing each day.  Write down your weight in the spaces on the form.  Compare today's weight to yesterday's weight.  Bring this form with you to your follow-up visits with your health care provider. Call your health care provider if you have concerns about your weight, including rapid weight gain or loss. Date: ________ Weight: ____________________ Date: ________ Weight: ____________________ Date: ________ Weight: ____________________ Date: ________ Weight: ____________________ Date: ________ Weight: ____________________ Date: ________ Weight: ____________________ Date: ________ Weight: ____________________ Date: ________ Weight: ____________________ Date: ________ Weight: ____________________ Date: ________ Weight: ____________________ Date: ________ Weight: ____________________ Date: ________ Weight: ____________________ Date: ________ Weight:  ____________________ Date: ________ Weight: ____________________ Date: ________ Weight: ____________________ Date: ________ Weight: ____________________ Date: ________ Weight: ____________________ Date: ________ Weight: ____________________ Date: ________ Weight: ____________________ Date: ________ Weight: ____________________ Date: ________ Weight: ____________________ Date: ________ Weight: ____________________ Date: ________ Weight: ____________________ Date: ________ Weight: ____________________ Date: ________ Weight: ____________________ Date: ________ Weight: ____________________ Date: ________ Weight: ____________________ Date: ________ Weight: ____________________ Date: ________ Weight: ____________________ Date: ________ Weight: ____________________ Date: ________ Weight: ____________________ Date: ________ Weight: ____________________ Date: ________ Weight: ____________________ Date: ________ Weight: ____________________ Date: ________ Weight: ____________________ Date: ________ Weight: ____________________ Date: ________ Weight: ____________________ Date: ________ Weight: ____________________ Date: ________ Weight: ____________________ Date: ________ Weight: ____________________ Date: ________ Weight: ____________________ Date: ________ Weight: ____________________ Date: ________ Weight: ____________________ Date: ________ Weight: ____________________ Date: ________ Weight: ____________________ Date: ________ Weight: ____________________ Date: ________ Weight: ____________________ Date: ________ Weight: ____________________ Date: ________ Weight: ____________________ Date: ________ Weight: ____________________ This information is not intended to replace advice given to you by your health care provider. Make sure you discuss any questions you have with your health care provider. Document Revised: 09/26/2017 Document Reviewed: 09/26/2017 Elsevier Patient Education  2020 [employer].

## 2020-06-25 ENCOUNTER — Encounter (HOSPITAL_COMMUNITY): Payer: Self-pay

## 2020-06-25 ENCOUNTER — Encounter (HOSPITAL_COMMUNITY)
Admission: RE | Admit: 2020-06-25 | Discharge: 2020-06-25 | Disposition: A | Discharge status: Home / Self Care | Payer: Medicare Other | Source: Ambulatory Visit | Attending: Nephrology | Admitting: Nephrology

## 2020-06-25 ENCOUNTER — Other Ambulatory Visit: Payer: Self-pay

## 2020-06-25 DIAGNOSIS — D631 Anemia in chronic kidney disease: Secondary | ICD-10-CM | POA: Diagnosis not present

## 2020-06-25 DIAGNOSIS — N184 Chronic kidney disease, stage 4 (severe): Secondary | ICD-10-CM | POA: Diagnosis not present

## 2020-06-25 LAB — POCT HEMOGLOBIN-HEMACUE: Hemoglobin: 8.9 g/dL — ABNORMAL LOW (ref 12.0–15.0)

## 2020-06-25 MED ORDER — EPOETIN ALFA 3000 UNIT/ML IJ SOLN
3000.0000 [IU] | Freq: Once | INTRAMUSCULAR | Status: AC
  Administered 2020-06-25: 3000 [IU] via SUBCUTANEOUS
  Filled 2020-06-25: qty 1

## 2020-07-09 ENCOUNTER — Encounter (HOSPITAL_COMMUNITY)
Admission: RE | Admit: 2020-07-09 | Discharge: 2020-07-09 | Disposition: A | Discharge status: Home / Self Care | Payer: Medicare Other | Source: Ambulatory Visit | Attending: Nephrology | Admitting: Nephrology

## 2020-07-09 ENCOUNTER — Other Ambulatory Visit: Payer: Self-pay

## 2020-07-09 ENCOUNTER — Encounter (HOSPITAL_COMMUNITY): Payer: Self-pay

## 2020-07-09 DIAGNOSIS — D631 Anemia in chronic kidney disease: Secondary | ICD-10-CM | POA: Diagnosis not present

## 2020-07-09 DIAGNOSIS — N184 Chronic kidney disease, stage 4 (severe): Secondary | ICD-10-CM | POA: Diagnosis not present

## 2020-07-09 LAB — POCT HEMOGLOBIN-HEMACUE: Hemoglobin: 9.9 g/dL — ABNORMAL LOW (ref 12.0–15.0)

## 2020-07-09 MED ORDER — EPOETIN ALFA 3000 UNIT/ML IJ SOLN
3000.0000 [IU] | Freq: Once | INTRAMUSCULAR | Status: AC
  Administered 2020-07-09: 3000 [IU] via SUBCUTANEOUS

## 2020-07-09 MED ORDER — EPOETIN ALFA 3000 UNIT/ML IJ SOLN
INTRAMUSCULAR | Status: AC
  Filled 2020-07-09: qty 1

## 2020-07-23 ENCOUNTER — Encounter (HOSPITAL_COMMUNITY)
Admission: RE | Admit: 2020-07-23 | Discharge: 2020-07-23 | Disposition: A | Discharge status: Home / Self Care | Payer: Medicare Other | Source: Ambulatory Visit | Attending: Nephrology | Admitting: Nephrology

## 2020-07-23 ENCOUNTER — Other Ambulatory Visit: Payer: Self-pay

## 2020-07-23 ENCOUNTER — Encounter (HOSPITAL_COMMUNITY): Payer: Self-pay

## 2020-07-23 DIAGNOSIS — D631 Anemia in chronic kidney disease: Secondary | ICD-10-CM | POA: Insufficient documentation

## 2020-07-23 DIAGNOSIS — N184 Chronic kidney disease, stage 4 (severe): Secondary | ICD-10-CM | POA: Insufficient documentation

## 2020-07-23 DIAGNOSIS — I129 Hypertensive chronic kidney disease with stage 1 through stage 4 chronic kidney disease, or unspecified chronic kidney disease: Secondary | ICD-10-CM | POA: Diagnosis not present

## 2020-07-23 DIAGNOSIS — N170 Acute kidney failure with tubular necrosis: Secondary | ICD-10-CM | POA: Diagnosis not present

## 2020-07-23 DIAGNOSIS — I5032 Chronic diastolic (congestive) heart failure: Secondary | ICD-10-CM | POA: Diagnosis not present

## 2020-07-23 HISTORY — DX: Chronic kidney disease, unspecified: N18.9

## 2020-07-23 LAB — RENAL FUNCTION PANEL
Albumin: 3.4 g/dL — ABNORMAL LOW (ref 3.5–5.0)
Anion gap: 15 (ref 5–15)
BUN: 45 mg/dL — ABNORMAL HIGH (ref 8–23)
CO2: 22 mmol/L (ref 22–32)
Calcium: 9.4 mg/dL (ref 8.9–10.3)
Chloride: 100 mmol/L (ref 98–111)
Creatinine, Ser: 2.28 mg/dL — ABNORMAL HIGH (ref 0.44–1.00)
GFR, Estimated: 19 mL/min — ABNORMAL LOW (ref >60)
Glucose, Bld: 93 mg/dL (ref 70–99)
Phosphorus: 4.6 mg/dL (ref 2.5–4.6)
Potassium: 5.1 mmol/L (ref 3.5–5.1)
Sodium: 137 mmol/L (ref 135–145)

## 2020-07-23 LAB — CBC
HCT: 31 % — ABNORMAL LOW (ref 36.0–46.0)
Hemoglobin: 9.8 g/dL — ABNORMAL LOW (ref 12.0–15.0)
MCH: 30.4 pg (ref 26.0–34.0)
MCHC: 31.6 g/dL (ref 30.0–36.0)
MCV: 96.3 fL (ref 80.0–100.0)
Platelets: 220 10*3/uL (ref 150–400)
RBC: 3.22 MIL/uL — ABNORMAL LOW (ref 3.87–5.11)
RDW: 14.6 % (ref 11.5–15.5)
WBC: 11.1 10*3/uL — ABNORMAL HIGH (ref 4.0–10.5)
nRBC: 0 % (ref 0.0–0.2)

## 2020-07-23 LAB — POCT HEMOGLOBIN-HEMACUE: Hemoglobin: 9.6 g/dL — ABNORMAL LOW (ref 12.0–15.0)

## 2020-07-23 MED ORDER — EPOETIN ALFA 3000 UNIT/ML IJ SOLN
INTRAMUSCULAR | Status: AC
  Filled 2020-07-23: qty 1

## 2020-07-23 MED ORDER — EPOETIN ALFA 3000 UNIT/ML IJ SOLN
3000.0000 [IU] | Freq: Once | INTRAMUSCULAR | Status: AC
  Administered 2020-07-23: 3000 [IU] via SUBCUTANEOUS

## 2020-08-06 ENCOUNTER — Other Ambulatory Visit: Payer: Self-pay

## 2020-08-06 ENCOUNTER — Encounter (HOSPITAL_COMMUNITY)
Admission: RE | Admit: 2020-08-06 | Discharge: 2020-08-06 | Disposition: A | Discharge status: Home / Self Care | Payer: Medicare Other | Source: Ambulatory Visit | Attending: Nephrology | Admitting: Nephrology

## 2020-08-06 ENCOUNTER — Encounter (HOSPITAL_COMMUNITY): Payer: Self-pay

## 2020-08-06 DIAGNOSIS — N170 Acute kidney failure with tubular necrosis: Secondary | ICD-10-CM | POA: Diagnosis not present

## 2020-08-06 DIAGNOSIS — I129 Hypertensive chronic kidney disease with stage 1 through stage 4 chronic kidney disease, or unspecified chronic kidney disease: Secondary | ICD-10-CM | POA: Diagnosis not present

## 2020-08-06 DIAGNOSIS — N184 Chronic kidney disease, stage 4 (severe): Secondary | ICD-10-CM | POA: Diagnosis not present

## 2020-08-06 DIAGNOSIS — D631 Anemia in chronic kidney disease: Secondary | ICD-10-CM | POA: Diagnosis not present

## 2020-08-06 DIAGNOSIS — I5032 Chronic diastolic (congestive) heart failure: Secondary | ICD-10-CM | POA: Diagnosis not present

## 2020-08-06 LAB — RENAL FUNCTION PANEL
Albumin: 3.4 g/dL — ABNORMAL LOW (ref 3.5–5.0)
Anion gap: 9 (ref 5–15)
BUN: 37 mg/dL — ABNORMAL HIGH (ref 8–23)
CO2: 23 mmol/L (ref 22–32)
Calcium: 9.3 mg/dL (ref 8.9–10.3)
Chloride: 106 mmol/L (ref 98–111)
Creatinine, Ser: 1.92 mg/dL — ABNORMAL HIGH (ref 0.44–1.00)
GFR, Estimated: 25 mL/min — ABNORMAL LOW (ref >60)
Glucose, Bld: 100 mg/dL — ABNORMAL HIGH (ref 70–99)
Phosphorus: 3.4 mg/dL (ref 2.5–4.6)
Potassium: 4.3 mmol/L (ref 3.5–5.1)
Sodium: 138 mmol/L (ref 135–145)

## 2020-08-06 LAB — POCT HEMOGLOBIN-HEMACUE: Hemoglobin: 9.8 g/dL — ABNORMAL LOW (ref 12.0–15.0)

## 2020-08-06 MED ORDER — EPOETIN ALFA 3000 UNIT/ML IJ SOLN
INTRAMUSCULAR | Status: AC
  Filled 2020-08-06: qty 1

## 2020-08-06 MED ORDER — EPOETIN ALFA 3000 UNIT/ML IJ SOLN
3000.0000 [IU] | Freq: Once | INTRAMUSCULAR | Status: AC
  Administered 2020-08-06: 3000 [IU] via SUBCUTANEOUS

## 2020-08-20 ENCOUNTER — Encounter (HOSPITAL_COMMUNITY)
Admission: RE | Admit: 2020-08-20 | Discharge: 2020-08-20 | Disposition: A | Discharge status: Home / Self Care | Payer: Medicare Other | Source: Ambulatory Visit | Attending: Nephrology | Admitting: Nephrology

## 2020-08-20 ENCOUNTER — Encounter (HOSPITAL_COMMUNITY): Payer: Self-pay

## 2020-08-20 ENCOUNTER — Other Ambulatory Visit: Payer: Self-pay

## 2020-08-20 DIAGNOSIS — D631 Anemia in chronic kidney disease: Secondary | ICD-10-CM | POA: Insufficient documentation

## 2020-08-20 DIAGNOSIS — N184 Chronic kidney disease, stage 4 (severe): Secondary | ICD-10-CM | POA: Diagnosis not present

## 2020-08-20 LAB — RENAL FUNCTION PANEL
Albumin: 3.4 g/dL — ABNORMAL LOW (ref 3.5–5.0)
Anion gap: 10 (ref 5–15)
BUN: 45 mg/dL — ABNORMAL HIGH (ref 8–23)
CO2: 20 mmol/L — ABNORMAL LOW (ref 22–32)
Calcium: 8.9 mg/dL (ref 8.9–10.3)
Chloride: 105 mmol/L (ref 98–111)
Creatinine, Ser: 1.97 mg/dL — ABNORMAL HIGH (ref 0.44–1.00)
GFR, Estimated: 24 mL/min — ABNORMAL LOW (ref >60)
Glucose, Bld: 88 mg/dL (ref 70–99)
Phosphorus: 3.2 mg/dL (ref 2.5–4.6)
Potassium: 4.2 mmol/L (ref 3.5–5.1)
Sodium: 135 mmol/L (ref 135–145)

## 2020-08-20 LAB — POCT HEMOGLOBIN-HEMACUE: Hemoglobin: 9.4 g/dL — ABNORMAL LOW (ref 12.0–15.0)

## 2020-08-20 MED ORDER — EPOETIN ALFA 3000 UNIT/ML IJ SOLN
3000.0000 [IU] | Freq: Once | INTRAMUSCULAR | Status: AC
  Administered 2020-08-20: 3000 [IU] via SUBCUTANEOUS
  Filled 2020-08-20: qty 1

## 2020-09-03 ENCOUNTER — Encounter (HOSPITAL_COMMUNITY)
Admission: RE | Admit: 2020-09-03 | Discharge: 2020-09-03 | Disposition: A | Discharge status: Home / Self Care | Payer: Medicare Other | Source: Ambulatory Visit | Attending: Nephrology | Admitting: Nephrology

## 2020-09-03 ENCOUNTER — Other Ambulatory Visit: Payer: Self-pay

## 2020-09-03 ENCOUNTER — Encounter (HOSPITAL_COMMUNITY): Payer: Self-pay

## 2020-09-03 DIAGNOSIS — D631 Anemia in chronic kidney disease: Secondary | ICD-10-CM | POA: Diagnosis not present

## 2020-09-03 DIAGNOSIS — I5032 Chronic diastolic (congestive) heart failure: Secondary | ICD-10-CM | POA: Diagnosis not present

## 2020-09-03 DIAGNOSIS — N170 Acute kidney failure with tubular necrosis: Secondary | ICD-10-CM | POA: Diagnosis not present

## 2020-09-03 DIAGNOSIS — I129 Hypertensive chronic kidney disease with stage 1 through stage 4 chronic kidney disease, or unspecified chronic kidney disease: Secondary | ICD-10-CM | POA: Diagnosis not present

## 2020-09-03 DIAGNOSIS — E872 Acidosis: Secondary | ICD-10-CM | POA: Diagnosis not present

## 2020-09-03 DIAGNOSIS — N184 Chronic kidney disease, stage 4 (severe): Secondary | ICD-10-CM | POA: Diagnosis not present

## 2020-09-03 LAB — POCT HEMOGLOBIN-HEMACUE: Hemoglobin: 9.7 g/dL — ABNORMAL LOW (ref 12.0–15.0)

## 2020-09-03 LAB — RENAL FUNCTION PANEL
Albumin: 3.5 g/dL (ref 3.5–5.0)
Anion gap: 10 (ref 5–15)
BUN: 42 mg/dL — ABNORMAL HIGH (ref 8–23)
CO2: 24 mmol/L (ref 22–32)
Calcium: 9.5 mg/dL (ref 8.9–10.3)
Chloride: 103 mmol/L (ref 98–111)
Creatinine, Ser: 2.16 mg/dL — ABNORMAL HIGH (ref 0.44–1.00)
GFR, Estimated: 22 mL/min — ABNORMAL LOW (ref >60)
Glucose, Bld: 91 mg/dL (ref 70–99)
Phosphorus: 3.8 mg/dL (ref 2.5–4.6)
Potassium: 4.5 mmol/L (ref 3.5–5.1)
Sodium: 137 mmol/L (ref 135–145)

## 2020-09-03 MED ORDER — EPOETIN ALFA 3000 UNIT/ML IJ SOLN
INTRAMUSCULAR | Status: AC
  Filled 2020-09-03: qty 1

## 2020-09-03 MED ORDER — EPOETIN ALFA 3000 UNIT/ML IJ SOLN
3000.0000 [IU] | Freq: Once | INTRAMUSCULAR | Status: AC
  Administered 2020-09-03: 3000 [IU] via SUBCUTANEOUS

## 2020-09-12 ENCOUNTER — Other Ambulatory Visit: Payer: Self-pay | Admitting: Family Medicine

## 2020-09-17 ENCOUNTER — Encounter (HOSPITAL_COMMUNITY)
Admission: RE | Admit: 2020-09-17 | Discharge: 2020-09-17 | Disposition: A | Discharge status: Home / Self Care | Payer: Medicare Other | Source: Ambulatory Visit | Attending: Nephrology | Admitting: Nephrology

## 2020-09-17 ENCOUNTER — Other Ambulatory Visit: Payer: Self-pay

## 2020-09-17 ENCOUNTER — Encounter (HOSPITAL_COMMUNITY): Payer: Self-pay

## 2020-09-17 DIAGNOSIS — D631 Anemia in chronic kidney disease: Secondary | ICD-10-CM | POA: Insufficient documentation

## 2020-09-17 DIAGNOSIS — I5032 Chronic diastolic (congestive) heart failure: Secondary | ICD-10-CM | POA: Diagnosis not present

## 2020-09-17 DIAGNOSIS — N184 Chronic kidney disease, stage 4 (severe): Secondary | ICD-10-CM | POA: Diagnosis not present

## 2020-09-17 DIAGNOSIS — I129 Hypertensive chronic kidney disease with stage 1 through stage 4 chronic kidney disease, or unspecified chronic kidney disease: Secondary | ICD-10-CM | POA: Diagnosis not present

## 2020-09-17 DIAGNOSIS — N170 Acute kidney failure with tubular necrosis: Secondary | ICD-10-CM | POA: Diagnosis not present

## 2020-09-17 DIAGNOSIS — E872 Acidosis: Secondary | ICD-10-CM | POA: Diagnosis not present

## 2020-09-17 LAB — RENAL FUNCTION PANEL
Albumin: 3.5 g/dL (ref 3.5–5.0)
Anion gap: 10 (ref 5–15)
BUN: 38 mg/dL — ABNORMAL HIGH (ref 8–23)
CO2: 20 mmol/L — ABNORMAL LOW (ref 22–32)
Calcium: 9.1 mg/dL (ref 8.9–10.3)
Chloride: 107 mmol/L (ref 98–111)
Creatinine, Ser: 1.94 mg/dL — ABNORMAL HIGH (ref 0.44–1.00)
GFR, Estimated: 25 mL/min — ABNORMAL LOW (ref >60)
Glucose, Bld: 88 mg/dL (ref 70–99)
Phosphorus: 3.1 mg/dL (ref 2.5–4.6)
Potassium: 4.6 mmol/L (ref 3.5–5.1)
Sodium: 137 mmol/L (ref 135–145)

## 2020-09-17 LAB — CBC
HCT: 33.5 % — ABNORMAL LOW (ref 36.0–46.0)
Hemoglobin: 9.9 g/dL — ABNORMAL LOW (ref 12.0–15.0)
MCH: 30.6 pg (ref 26.0–34.0)
MCHC: 29.6 g/dL — ABNORMAL LOW (ref 30.0–36.0)
MCV: 103.4 fL — ABNORMAL HIGH (ref 80.0–100.0)
Platelets: 211 10*3/uL (ref 150–400)
RBC: 3.24 MIL/uL — ABNORMAL LOW (ref 3.87–5.11)
RDW: 15.4 % (ref 11.5–15.5)
WBC: 12.5 10*3/uL — ABNORMAL HIGH (ref 4.0–10.5)
nRBC: 0 % (ref 0.0–0.2)

## 2020-09-17 LAB — POCT HEMOGLOBIN-HEMACUE: Hemoglobin: 9.1 g/dL — ABNORMAL LOW (ref 12.0–15.0)

## 2020-09-17 MED ORDER — EPOETIN ALFA 3000 UNIT/ML IJ SOLN
3000.0000 [IU] | Freq: Once | INTRAMUSCULAR | Status: AC
  Administered 2020-09-17: 3000 [IU] via SUBCUTANEOUS
  Filled 2020-09-17: qty 1

## 2020-10-01 ENCOUNTER — Other Ambulatory Visit: Payer: Self-pay

## 2020-10-01 ENCOUNTER — Encounter (HOSPITAL_COMMUNITY)
Admission: RE | Admit: 2020-10-01 | Discharge: 2020-10-01 | Disposition: A | Discharge status: Home / Self Care | Payer: Medicare Other | Source: Ambulatory Visit | Attending: Nephrology | Admitting: Nephrology

## 2020-10-01 ENCOUNTER — Encounter (HOSPITAL_COMMUNITY): Payer: Self-pay

## 2020-10-01 DIAGNOSIS — N184 Chronic kidney disease, stage 4 (severe): Secondary | ICD-10-CM | POA: Diagnosis not present

## 2020-10-01 DIAGNOSIS — D631 Anemia in chronic kidney disease: Secondary | ICD-10-CM | POA: Diagnosis not present

## 2020-10-01 LAB — POCT HEMOGLOBIN-HEMACUE: Hemoglobin: 9.6 g/dL — ABNORMAL LOW (ref 12.0–15.0)

## 2020-10-01 MED ORDER — EPOETIN ALFA 3000 UNIT/ML IJ SOLN
3000.0000 [IU] | Freq: Once | INTRAMUSCULAR | Status: AC
  Administered 2020-10-01: 3000 [IU] via SUBCUTANEOUS
  Filled 2020-10-01: qty 1

## 2020-10-10 DIAGNOSIS — J449 Chronic obstructive pulmonary disease, unspecified: Secondary | ICD-10-CM | POA: Diagnosis not present

## 2020-10-10 DIAGNOSIS — I110 Hypertensive heart disease with heart failure: Secondary | ICD-10-CM | POA: Diagnosis not present

## 2020-10-10 DIAGNOSIS — I5032 Chronic diastolic (congestive) heart failure: Secondary | ICD-10-CM | POA: Diagnosis not present

## 2020-10-10 DIAGNOSIS — Z87891 Personal history of nicotine dependence: Secondary | ICD-10-CM | POA: Diagnosis not present

## 2020-10-17 ENCOUNTER — Encounter (HOSPITAL_COMMUNITY)
Admission: RE | Admit: 2020-10-17 | Discharge: 2020-10-17 | Disposition: A | Discharge status: Home / Self Care | Payer: Medicare Other | Source: Ambulatory Visit | Attending: Nephrology | Admitting: Nephrology

## 2020-10-17 ENCOUNTER — Other Ambulatory Visit: Payer: Self-pay

## 2020-10-17 ENCOUNTER — Encounter (HOSPITAL_COMMUNITY): Payer: Self-pay

## 2020-10-17 DIAGNOSIS — N184 Chronic kidney disease, stage 4 (severe): Secondary | ICD-10-CM | POA: Diagnosis not present

## 2020-10-17 DIAGNOSIS — D631 Anemia in chronic kidney disease: Secondary | ICD-10-CM | POA: Diagnosis not present

## 2020-10-17 LAB — POCT HEMOGLOBIN-HEMACUE: Hemoglobin: 9.7 g/dL — ABNORMAL LOW (ref 12.0–15.0)

## 2020-10-17 MED ORDER — EPOETIN ALFA-EPBX 3000 UNIT/ML IJ SOLN
INTRAMUSCULAR | Status: AC
  Filled 2020-10-17: qty 1

## 2020-10-17 MED ORDER — EPOETIN ALFA-EPBX 3000 UNIT/ML IJ SOLN
3000.0000 [IU] | Freq: Once | INTRAMUSCULAR | Status: AC
  Administered 2020-10-17: 3000 [IU] via SUBCUTANEOUS

## 2020-10-31 ENCOUNTER — Encounter (HOSPITAL_COMMUNITY): Payer: Medicare Other

## 2020-10-31 ENCOUNTER — Encounter (HOSPITAL_COMMUNITY): Admission: RE | Admit: 2020-10-31 | Payer: Medicare Other | Source: Ambulatory Visit

## 2020-11-06 ENCOUNTER — Encounter (HOSPITAL_COMMUNITY): Admission: RE | Admit: 2020-11-06 | Payer: Medicare Other | Source: Ambulatory Visit

## 2020-11-11 ENCOUNTER — Encounter (HOSPITAL_COMMUNITY): Admission: RE | Admit: 2020-11-11 | Payer: Medicare Other | Source: Ambulatory Visit

## 2020-11-19 ENCOUNTER — Other Ambulatory Visit: Payer: Self-pay

## 2020-11-19 ENCOUNTER — Encounter (HOSPITAL_COMMUNITY): Payer: Self-pay

## 2020-11-19 ENCOUNTER — Encounter (HOSPITAL_COMMUNITY)
Admission: RE | Admit: 2020-11-19 | Discharge: 2020-11-19 | Disposition: A | Discharge status: Home / Self Care | Payer: Medicare Other | Source: Ambulatory Visit | Attending: Nephrology | Admitting: Nephrology

## 2020-11-19 DIAGNOSIS — N184 Chronic kidney disease, stage 4 (severe): Secondary | ICD-10-CM | POA: Insufficient documentation

## 2020-11-19 DIAGNOSIS — D631 Anemia in chronic kidney disease: Secondary | ICD-10-CM | POA: Diagnosis not present

## 2020-11-19 LAB — RENAL FUNCTION PANEL
Albumin: 3.3 g/dL — ABNORMAL LOW (ref 3.5–5.0)
Anion gap: 9 (ref 5–15)
BUN: 31 mg/dL — ABNORMAL HIGH (ref 8–23)
CO2: 21 mmol/L — ABNORMAL LOW (ref 22–32)
Calcium: 9 mg/dL (ref 8.9–10.3)
Chloride: 105 mmol/L (ref 98–111)
Creatinine, Ser: 1.98 mg/dL — ABNORMAL HIGH (ref 0.44–1.00)
GFR, Estimated: 24 mL/min — ABNORMAL LOW (ref >60)
Glucose, Bld: 89 mg/dL (ref 70–99)
Phosphorus: 3 mg/dL (ref 2.5–4.6)
Potassium: 5.1 mmol/L (ref 3.5–5.1)
Sodium: 135 mmol/L (ref 135–145)

## 2020-11-19 LAB — CBC
HCT: 31.6 % — ABNORMAL LOW (ref 36.0–46.0)
Hemoglobin: 9.5 g/dL — ABNORMAL LOW (ref 12.0–15.0)
MCH: 29.9 pg (ref 26.0–34.0)
MCHC: 30.1 g/dL (ref 30.0–36.0)
MCV: 99.4 fL (ref 80.0–100.0)
Platelets: 163 10*3/uL (ref 150–400)
RBC: 3.18 MIL/uL — ABNORMAL LOW (ref 3.87–5.11)
RDW: 16.2 % — ABNORMAL HIGH (ref 11.5–15.5)
WBC: 11.1 10*3/uL — ABNORMAL HIGH (ref 4.0–10.5)
nRBC: 0 % (ref 0.0–0.2)

## 2020-11-19 LAB — POCT HEMOGLOBIN-HEMACUE: Hemoglobin: 9.2 g/dL — ABNORMAL LOW (ref 12.0–15.0)

## 2020-11-19 MED ORDER — EPOETIN ALFA-EPBX 3000 UNIT/ML IJ SOLN
INTRAMUSCULAR | Status: AC
  Filled 2020-11-19: qty 1

## 2020-11-19 MED ORDER — EPOETIN ALFA-EPBX 3000 UNIT/ML IJ SOLN
3000.0000 [IU] | Freq: Once | INTRAMUSCULAR | Status: AC
  Administered 2020-11-19: 3000 [IU] via SUBCUTANEOUS

## 2020-11-30 ENCOUNTER — Other Ambulatory Visit: Payer: Self-pay | Admitting: Family Medicine

## 2020-11-30 ENCOUNTER — Other Ambulatory Visit: Payer: Self-pay | Admitting: Cardiology

## 2020-12-01 DIAGNOSIS — I129 Hypertensive chronic kidney disease with stage 1 through stage 4 chronic kidney disease, or unspecified chronic kidney disease: Secondary | ICD-10-CM | POA: Diagnosis not present

## 2020-12-01 DIAGNOSIS — N189 Chronic kidney disease, unspecified: Secondary | ICD-10-CM | POA: Diagnosis not present

## 2020-12-01 DIAGNOSIS — D631 Anemia in chronic kidney disease: Secondary | ICD-10-CM | POA: Diagnosis not present

## 2020-12-01 DIAGNOSIS — I5032 Chronic diastolic (congestive) heart failure: Secondary | ICD-10-CM | POA: Diagnosis not present

## 2020-12-01 DIAGNOSIS — E872 Acidosis: Secondary | ICD-10-CM | POA: Diagnosis not present

## 2020-12-01 DIAGNOSIS — E211 Secondary hyperparathyroidism, not elsewhere classified: Secondary | ICD-10-CM | POA: Diagnosis not present

## 2020-12-01 DIAGNOSIS — N184 Chronic kidney disease, stage 4 (severe): Secondary | ICD-10-CM | POA: Diagnosis not present

## 2020-12-03 ENCOUNTER — Encounter (HOSPITAL_COMMUNITY): Admission: RE | Admit: 2020-12-03 | Payer: Medicare Other | Source: Ambulatory Visit

## 2020-12-03 NOTE — Progress Notes (Unsigned)
Cardiology Office Note  Date: 12/04/2020   ID: Alison [name], DOB January 11, 1931, MRN [id number]  PCP:  [name], MD  Cardiologist:  Rozann Lesches, MD Electrophysiologist:  None   Chief Complaint  Patient presents with  . Cardiac follow-up    History of Present Illness: Alison [name] is an 87 y.o. female last seen in September 2021.  She is here today with her daughter for a follow-up visit.  Reports stable dyspnea on exertion with ADLs, no angina symptoms, no palpitations or syncope.  She does not report any recent falls.  She had a recent follow-up visit with Dr. Theador Hawthorne, CKD stage IV at this point.  Lab work is outlined below.  I reviewed her medications which are stable from a cardiac perspective.  She states that she continues to make reasonable amount of urine on present diuretics, her weight is actually down by 3 pounds compared to last visit.  Past Medical History:  Diagnosis Date  . Chronic anemia   . Chronic back pain   . Chronic kidney disease   . COPD (chronic obstructive pulmonary disease) ([place])   . Coronary atherosclerosis of native coronary artery    Occluded circ with collaterals and 90% RCA (previous stents at [facility], details not clear), LVEF 48% 1/11 - inferior and inferolateral scar by Myoview 1/11,   . Gout   . History of blood transfusion   . History of pneumonia   . Hyperlipidemia   . Hypertension   . Myocardial infarction ([place]) 1997  . Nocturia   . Osteoarthritis   . Pulmonary embolism (HCC)    History of recurrent DVT and PE status post IVC filter, previously on Coumadin    Past Surgical History:  Procedure Laterality Date  . ABDOMINAL HYSTERECTOMY  1965   partial  . APPENDECTOMY    . BACK SURGERY    . cataract surgery Bilateral   . CORONARY ANGIOPLASTY  1997   3 stents  . cyst removed from back  2006  . LAMINECTOMY WITH POSTERIOR LATERAL ARTHRODESIS LEVEL 1 N/A 09/10/2016   Procedure: Posterior Lateral Fusion - Lumbar  four-Lumbar five, lumbar laminectomy and pedicle screw fixation  - Lumbar four-Lumbar five;  Surgeon: Eustace Moore, MD;  Location: [place];  Service: Neurosurgery;  Laterality: N/A;  . Left adrenalectomy  2009  . Right breast biopsy  2008  . Right knee replacement  2011    Current Outpatient Medications  Medication Sig Dispense Refill  . acetaminophen (TYLENOL) 650 MG CR tablet Take 650 mg by mouth 2 (two) times daily.     Marland Kitchen ADVAIR DISKUS 250-50 MCG/DOSE AEPB Inhale 1 Inhaler into the lungs 2 (two) times daily.    Marland Kitchen allopurinol (ZYLOPRIM) 100 MG tablet Take 100 mg by mouth daily.    . calcitRIOL (ROCALTROL) 0.25 MCG capsule Take 0.25 mcg by mouth daily.    . Calcium Carb-Cholecalciferol (CALCIUM 600+D3 PO) Take 1 tablet by mouth daily.    . clopidogrel (PLAVIX) 75 MG tablet TAKE 1 TABLET BY MOUTH  DAILY 90 tablet 3  . epoetin alfa (EPOGEN) 3000 UNIT/ML injection Inject 3,000 Units into the vein every 14 (fourteen) days.    Marland Kitchen epoetin alfa-epbx (RETACRIT) 3000 UNIT/ML injection Inject 3,000 Units into the skin every 14 (fourteen) days.    . ferrous sulfate 325 (65 FE) MG tablet Take 325 mg by mouth daily.     . furosemide (LASIX) 20 MG tablet TAKE 1 TABLET BY MOUTH  TWICE DAILY MAY  TAKE 1  EXTRA TABLET DAILY AS  NEEDED FOR SWELLING OR  SHORTNESS OF BREATH 270 tablet 3  . isosorbide mononitrate (IMDUR) 30 MG 24 hr tablet TAKE 1 TABLET BY MOUTH  DAILY 90 tablet 3  . losartan (COZAAR) 50 MG tablet Take 1 tablet (50 mg total) by mouth daily. 90 tablet 3  . metoprolol succinate (TOPROL-XL) 25 MG 24 hr tablet TAKE 1 TABLET BY MOUTH  DAILY 90 tablet 3  . nitroGLYCERIN (NITROSTAT) 0.4 MG SL tablet Place 1 tablet (0.4 mg total) under the tongue every 5 (five) minutes as needed for chest pain. 25 tablet 3  . potassium chloride (KLOR-CON) 10 MEQ tablet TAKE 1 TABLET BY MOUTH  DAILY 90 tablet 3  . rosuvastatin (CRESTOR) 10 MG tablet TAKE 1 TABLET BY MOUTH  DAILY 90 tablet 3   No current  facility-administered medications for this visit.   Allergies:  Patient has no known allergies.   ROS: No syncope.  Hearing loss.  Physical Exam: VS:  BP 130/80   Pulse 75   Ht 5\' 1"  (1.549 m)   Wt 217 lb 9.6 oz (98.7 kg)   SpO2 95%   BMI 41.12 kg/m , BMI Body mass index is 41.12 kg/m.  Wt Readings from Last 3 Encounters:  12/04/20 217 lb 9.6 oz (98.7 kg)  09/03/20 220 lb 7.4 oz (100 kg)  07/09/20 220 lb 14.4 oz (100.2 kg)    General: Elderly woman, seated in wheelchair. HEENT: Conjunctiva and lids normal, wearing a mask. Neck: Supple, no elevated JVP or carotid bruits, no thyromegaly. Lungs: Clear to auscultation, nonlabored breathing at rest. Cardiac: Regular rate and rhythm, no S3, soft systolic murmur. Extremities: Stable leg edema.  ECG:  An ECG dated 01/24/2020 was personally reviewed today and demonstrated:  Sinus rhythm with poor R wave progression, left anterior fascicular block.  Recent Labwork: 11/19/2020: BUN 31; Creatinine, Ser 1.98; Hemoglobin 9.5; Hemoglobin 9.2; Platelets 163; Potassium 5.1; Sodium 135   Other Studies Reviewed Today:  Echocardiogram 01/24/2020: 1. Left ventricular ejection fraction, by estimation, is 45 to 50%. The  left ventricle has normal function. The left ventricle has no regional  wall motion abnormalities. There is mild left ventricular hypertrophy.  Left ventricular diastolic parameters  are consistent with Grade I diastolic dysfunction (impaired relaxation).  Elevated left atrial pressure.  2. Right ventricular systolic function is normal. The right ventricular  size is normal. There is moderately elevated pulmonary artery systolic  pressure.  3. Left atrial size was severely dilated.  4. Right atrial size was mildly dilated.  5. The mitral valve is normal in structure. Mild mitral valve  regurgitation. No evidence of mitral stenosis.  6. The aortic valve is tricuspid. Aortic valve regurgitation is not  visualized. No  aortic stenosis is present.  7. Mild pulmonary HTN, PASP is 39 mmHg.  8. The inferior vena cava is normal in size with greater than 50%  respiratory variability, suggesting right atrial pressure of 3 mmHg.   Assessment and Plan:  1.  CAD with known occlusion of the circumflex associated with collaterals as well as prior RCA stent intervention.  She reports no progressive dyspnea on exertion or obvious angina symptoms and we plan to continue with medical therapy.  She is on Plavix, Toprol-XL, losartan, Imdur, and Crestor.  2.  CKD stage IV, followed by Dr. Theador Hawthorne.  3.  Chronic combined heart failure with mild LV dysfunction, LVEF 45 to 50%.  Weight down 3 pounds, she continues on  Lasix with potassium supplement.  Medication Adjustments/Labs and Tests Ordered: Current medicines are reviewed at length with the patient today.  Concerns regarding medicines are outlined above.   Tests Ordered: No orders of the defined types were placed in this encounter.   Medication Changes: No orders of the defined types were placed in this encounter.   Disposition:  Follow up 6 months in the [place] office.  Signed, Satira Sark, MD, [facility] 12/04/2020 10:45 AM     [facility] [address] [place], [place] [address] Phone: [phone]; Fax: [phone]

## 2020-12-04 ENCOUNTER — Ambulatory Visit: Payer: Medicare Other | Admitting: Cardiology

## 2020-12-04 ENCOUNTER — Other Ambulatory Visit: Payer: Self-pay

## 2020-12-04 ENCOUNTER — Encounter: Payer: Self-pay | Admitting: Cardiology

## 2020-12-04 VITALS — BP 130/80 | HR 75 | Ht 61.0 in | Wt 217.6 lb

## 2020-12-04 DIAGNOSIS — I5042 Chronic combined systolic (congestive) and diastolic (congestive) heart failure: Secondary | ICD-10-CM

## 2020-12-04 DIAGNOSIS — N184 Chronic kidney disease, stage 4 (severe): Secondary | ICD-10-CM | POA: Diagnosis not present

## 2020-12-04 DIAGNOSIS — I25119 Atherosclerotic heart disease of native coronary artery with unspecified angina pectoris: Secondary | ICD-10-CM

## 2020-12-04 MED ORDER — NITROGLYCERIN 0.4 MG SL SUBL: 0.4000 mg | SUBLINGUAL_TABLET | SUBLINGUAL | 3 refills | Status: DC | PRN

## 2020-12-04 NOTE — Telephone Encounter (Signed)
Medication refill request approved and sent to [facility]

## 2020-12-04 NOTE — Patient Instructions (Signed)
Medication Instructions:  °Your physician recommends that you continue on your current medications as directed. Please refer to the Current Medication list given to you today. ° °*If you need a refill on your cardiac medications before your next appointment, please call your pharmacy* ° ° °Lab Work: °None today °If you have labs (blood work) drawn today and your tests are completely normal, you will receive your results only by: °• MyChart Message (if you have MyChart) OR °• A paper copy in the mail °If you have any lab test that is abnormal or we need to change your treatment, we will call you to review the results. ° ° °Testing/Procedures: °None today ° ° °Follow-Up: °At [facility], you and your health needs are our priority.  As part of our continuing mission to provide you with exceptional heart care, we have created designated Provider Care Teams.  These Care Teams include your primary Cardiologist (physician) and Advanced Practice Providers (APPs -  Physician Assistants and Nurse Practitioners) who all work together to provide you with the care you need, when you need it. ° °We recommend signing up for the patient portal called "MyChart".  Sign up information is provided on this After Visit Summary.  MyChart is used to connect with patients for Virtual Visits (Telemedicine).  Patients are able to view lab/test results, encounter notes, upcoming appointments, etc.  Non-urgent messages can be sent to your provider as well.   °To learn more about what you can do with MyChart, go to [email or web address].   ° °Your next appointment:   °6 month(s) ° °The format for your next appointment:   °In Person ° °Provider:   °Samuel McDowell, MD ° ° °Other Instructions °None ° ° ° ° °Thank you for choosing  [facility] ! ° ° ° ° ° ° ° ° °

## 2020-12-08 DIAGNOSIS — I110 Hypertensive heart disease with heart failure: Secondary | ICD-10-CM | POA: Diagnosis not present

## 2020-12-08 DIAGNOSIS — Z87891 Personal history of nicotine dependence: Secondary | ICD-10-CM | POA: Diagnosis not present

## 2020-12-08 DIAGNOSIS — I5032 Chronic diastolic (congestive) heart failure: Secondary | ICD-10-CM | POA: Diagnosis not present

## 2020-12-08 DIAGNOSIS — J449 Chronic obstructive pulmonary disease, unspecified: Secondary | ICD-10-CM | POA: Diagnosis not present

## 2020-12-09 DIAGNOSIS — D631 Anemia in chronic kidney disease: Secondary | ICD-10-CM | POA: Diagnosis not present

## 2020-12-09 DIAGNOSIS — N184 Chronic kidney disease, stage 4 (severe): Secondary | ICD-10-CM | POA: Diagnosis not present

## 2020-12-09 DIAGNOSIS — M545 Low back pain, unspecified: Secondary | ICD-10-CM | POA: Diagnosis not present

## 2020-12-09 DIAGNOSIS — I2510 Atherosclerotic heart disease of native coronary artery without angina pectoris: Secondary | ICD-10-CM | POA: Diagnosis not present

## 2020-12-09 DIAGNOSIS — E782 Mixed hyperlipidemia: Secondary | ICD-10-CM | POA: Diagnosis not present

## 2020-12-09 DIAGNOSIS — I10 Essential (primary) hypertension: Secondary | ICD-10-CM | POA: Diagnosis not present

## 2020-12-09 DIAGNOSIS — Z7189 Other specified counseling: Secondary | ICD-10-CM | POA: Diagnosis not present

## 2020-12-09 DIAGNOSIS — J449 Chronic obstructive pulmonary disease, unspecified: Secondary | ICD-10-CM | POA: Diagnosis not present

## 2020-12-11 ENCOUNTER — Encounter (HOSPITAL_COMMUNITY)
Admission: RE | Admit: 2020-12-11 | Discharge: 2020-12-11 | Disposition: A | Discharge status: Home / Self Care | Payer: Medicare Other | Source: Ambulatory Visit | Attending: Nephrology | Admitting: Nephrology

## 2020-12-11 ENCOUNTER — Encounter (HOSPITAL_COMMUNITY): Payer: Self-pay

## 2020-12-11 ENCOUNTER — Other Ambulatory Visit: Payer: Self-pay

## 2020-12-11 DIAGNOSIS — D631 Anemia in chronic kidney disease: Secondary | ICD-10-CM | POA: Diagnosis not present

## 2020-12-11 DIAGNOSIS — N184 Chronic kidney disease, stage 4 (severe): Secondary | ICD-10-CM | POA: Insufficient documentation

## 2020-12-11 LAB — PROTEIN / CREATININE RATIO, URINE
Creatinine, Urine: 63.96 mg/dL
Protein Creatinine Ratio: 0.06 mg/mg{Cre} (ref 0.00–0.15)
Total Protein, Urine: 4 mg/dL

## 2020-12-11 LAB — POCT HEMOGLOBIN-HEMACUE: Hemoglobin: 9 g/dL — ABNORMAL LOW (ref 12.0–15.0)

## 2020-12-11 MED ORDER — EPOETIN ALFA-EPBX 3000 UNIT/ML IJ SOLN
INTRAMUSCULAR | Status: AC
  Administered 2020-12-11: 3000 [IU] via SUBCUTANEOUS
  Filled 2020-12-11: qty 1

## 2020-12-11 MED ORDER — EPOETIN ALFA-EPBX 3000 UNIT/ML IJ SOLN: 3000.0000 [IU] | Freq: Once | INTRAMUSCULAR | Status: AC

## 2020-12-18 ENCOUNTER — Other Ambulatory Visit: Payer: Self-pay | Admitting: Cardiology

## 2020-12-19 ENCOUNTER — Ambulatory Visit: Payer: Medicare Other | Admitting: Cardiology

## 2020-12-26 ENCOUNTER — Encounter (HOSPITAL_COMMUNITY): Admission: RE | Admit: 2020-12-26 | Payer: Medicare Other | Source: Ambulatory Visit

## 2021-01-01 ENCOUNTER — Other Ambulatory Visit: Payer: Self-pay

## 2021-01-01 ENCOUNTER — Encounter (HOSPITAL_COMMUNITY)
Admission: RE | Admit: 2021-01-01 | Discharge: 2021-01-01 | Disposition: A | Discharge status: Home / Self Care | Payer: Medicare Other | Source: Ambulatory Visit | Attending: Nephrology | Admitting: Nephrology

## 2021-01-01 ENCOUNTER — Encounter (HOSPITAL_COMMUNITY): Payer: Self-pay

## 2021-01-01 DIAGNOSIS — N184 Chronic kidney disease, stage 4 (severe): Secondary | ICD-10-CM | POA: Diagnosis not present

## 2021-01-01 DIAGNOSIS — D631 Anemia in chronic kidney disease: Secondary | ICD-10-CM | POA: Diagnosis not present

## 2021-01-01 LAB — POCT HEMOGLOBIN-HEMACUE: Hemoglobin: 9.2 g/dL — ABNORMAL LOW (ref 12.0–15.0)

## 2021-01-01 MED ORDER — EPOETIN ALFA-EPBX 3000 UNIT/ML IJ SOLN
3000.0000 [IU] | Freq: Once | INTRAMUSCULAR | Status: AC
  Administered 2021-01-01: 3000 [IU] via SUBCUTANEOUS
  Filled 2021-01-01: qty 1

## 2021-01-15 ENCOUNTER — Encounter (HOSPITAL_COMMUNITY): Payer: Self-pay

## 2021-01-15 ENCOUNTER — Encounter (HOSPITAL_COMMUNITY)
Admission: RE | Admit: 2021-01-15 | Discharge: 2021-01-15 | Disposition: A | Discharge status: Home / Self Care | Payer: Medicare Other | Source: Ambulatory Visit | Attending: Nephrology | Admitting: Nephrology

## 2021-01-15 ENCOUNTER — Other Ambulatory Visit: Payer: Self-pay

## 2021-01-15 DIAGNOSIS — N184 Chronic kidney disease, stage 4 (severe): Secondary | ICD-10-CM | POA: Insufficient documentation

## 2021-01-15 MED ORDER — EPOETIN ALFA-EPBX 3000 UNIT/ML IJ SOLN
3000.0000 [IU] | Freq: Once | INTRAMUSCULAR | Status: AC
  Administered 2021-01-15: 3000 [IU] via SUBCUTANEOUS

## 2021-01-15 MED ORDER — EPOETIN ALFA-EPBX 3000 UNIT/ML IJ SOLN
INTRAMUSCULAR | Status: AC
  Filled 2021-01-15: qty 1

## 2021-01-22 LAB — POCT HEMOGLOBIN-HEMACUE: Hemoglobin: 9 g/dL — ABNORMAL LOW (ref 12.0–15.0)

## 2021-01-29 ENCOUNTER — Other Ambulatory Visit: Payer: Self-pay

## 2021-01-29 ENCOUNTER — Encounter (HOSPITAL_COMMUNITY)
Admission: RE | Admit: 2021-01-29 | Discharge: 2021-01-29 | Disposition: A | Discharge status: Home / Self Care | Payer: Medicare Other | Source: Ambulatory Visit | Attending: Nephrology | Admitting: Nephrology

## 2021-01-29 DIAGNOSIS — N184 Chronic kidney disease, stage 4 (severe): Secondary | ICD-10-CM | POA: Diagnosis not present

## 2021-01-29 LAB — CBC WITH DIFFERENTIAL/PLATELET
Abs Immature Granulocytes: 0.07 10*3/uL (ref 0.00–0.07)
Basophils Absolute: 0.1 10*3/uL (ref 0.0–0.1)
Basophils Relative: 1 %
Eosinophils Absolute: 0.3 10*3/uL (ref 0.0–0.5)
Eosinophils Relative: 3 %
HCT: 34.1 % — ABNORMAL LOW (ref 36.0–46.0)
Hemoglobin: 10.1 g/dL — ABNORMAL LOW (ref 12.0–15.0)
Immature Granulocytes: 1 %
Lymphocytes Relative: 23 %
Lymphs Abs: 2.5 10*3/uL (ref 0.7–4.0)
MCH: 30.5 pg (ref 26.0–34.0)
MCHC: 29.6 g/dL — ABNORMAL LOW (ref 30.0–36.0)
MCV: 103 fL — ABNORMAL HIGH (ref 80.0–100.0)
Monocytes Absolute: 0.6 10*3/uL (ref 0.1–1.0)
Monocytes Relative: 6 %
Neutro Abs: 7.4 10*3/uL (ref 1.7–7.7)
Neutrophils Relative %: 66 %
Platelets: 199 10*3/uL (ref 150–400)
RBC: 3.31 MIL/uL — ABNORMAL LOW (ref 3.87–5.11)
RDW: 16.1 % — ABNORMAL HIGH (ref 11.5–15.5)
WBC: 11 10*3/uL — ABNORMAL HIGH (ref 4.0–10.5)
nRBC: 0 % (ref 0.0–0.2)

## 2021-01-29 LAB — RENAL FUNCTION PANEL
Albumin: 3.7 g/dL (ref 3.5–5.0)
Anion gap: 10 (ref 5–15)
BUN: 54 mg/dL — ABNORMAL HIGH (ref 8–23)
CO2: 20 mmol/L — ABNORMAL LOW (ref 22–32)
Calcium: 9.5 mg/dL (ref 8.9–10.3)
Chloride: 108 mmol/L (ref 98–111)
Creatinine, Ser: 2.5 mg/dL — ABNORMAL HIGH (ref 0.44–1.00)
GFR, Estimated: 18 mL/min — ABNORMAL LOW (ref >60)
Glucose, Bld: 94 mg/dL (ref 70–99)
Phosphorus: 3.9 mg/dL (ref 2.5–4.6)
Potassium: 4.7 mmol/L (ref 3.5–5.1)
Sodium: 138 mmol/L (ref 135–145)

## 2021-01-29 LAB — IRON AND TIBC
Iron: 61 ug/dL (ref 28–170)
Saturation Ratios: 21 % (ref 10.4–31.8)
TIBC: 285 ug/dL (ref 250–450)
UIBC: 224 ug/dL

## 2021-01-29 LAB — POCT HEMOGLOBIN-HEMACUE: Hemoglobin: 9.6 g/dL — ABNORMAL LOW (ref 12.0–15.0)

## 2021-01-29 MED ORDER — EPOETIN ALFA-EPBX 3000 UNIT/ML IJ SOLN
INTRAMUSCULAR | Status: AC
  Filled 2021-01-29: qty 1

## 2021-01-29 MED ORDER — EPOETIN ALFA-EPBX 3000 UNIT/ML IJ SOLN
3000.0000 [IU] | Freq: Once | INTRAMUSCULAR | Status: AC
Start: 2021-01-29 — End: 2021-01-29
  Administered 2021-01-29: 3000 [IU] via SUBCUTANEOUS

## 2021-01-29 MED ORDER — EPOETIN ALFA 3000 UNIT/ML IJ SOLN
INTRAMUSCULAR | Status: AC
  Filled 2021-01-29: qty 1

## 2021-01-30 LAB — PTH, INTACT AND CALCIUM
Calcium, Total (PTH): 9.5 mg/dL (ref 8.7–10.3)
PTH: 30 pg/mL (ref 15–65)

## 2021-02-12 ENCOUNTER — Other Ambulatory Visit: Payer: Self-pay

## 2021-02-12 ENCOUNTER — Encounter (HOSPITAL_COMMUNITY)
Admission: RE | Admit: 2021-02-12 | Discharge: 2021-02-12 | Disposition: A | Discharge status: Home / Self Care | Payer: Medicare Other | Source: Ambulatory Visit | Attending: Nephrology | Admitting: Nephrology

## 2021-02-12 DIAGNOSIS — N184 Chronic kidney disease, stage 4 (severe): Secondary | ICD-10-CM | POA: Insufficient documentation

## 2021-02-12 DIAGNOSIS — D631 Anemia in chronic kidney disease: Secondary | ICD-10-CM | POA: Diagnosis not present

## 2021-02-12 LAB — POCT HEMOGLOBIN-HEMACUE: Hemoglobin: 9.1 g/dL — ABNORMAL LOW (ref 12.0–15.0)

## 2021-02-12 MED ORDER — EPOETIN ALFA-EPBX 3000 UNIT/ML IJ SOLN
3000.0000 [IU] | Freq: Once | INTRAMUSCULAR | Status: AC
  Administered 2021-02-12: 3000 [IU] via SUBCUTANEOUS

## 2021-02-12 MED ORDER — EPOETIN ALFA-EPBX 3000 UNIT/ML IJ SOLN
INTRAMUSCULAR | Status: AC
  Filled 2021-02-12: qty 1

## 2021-02-26 ENCOUNTER — Encounter (HOSPITAL_COMMUNITY): Payer: Self-pay

## 2021-02-26 ENCOUNTER — Other Ambulatory Visit: Payer: Self-pay

## 2021-02-26 ENCOUNTER — Encounter (HOSPITAL_COMMUNITY)
Admission: RE | Admit: 2021-02-26 | Discharge: 2021-02-26 | Disposition: A | Discharge status: Home / Self Care | Payer: Medicare Other | Source: Ambulatory Visit | Attending: Nephrology | Admitting: Nephrology

## 2021-02-26 DIAGNOSIS — D631 Anemia in chronic kidney disease: Secondary | ICD-10-CM | POA: Diagnosis not present

## 2021-02-26 DIAGNOSIS — N184 Chronic kidney disease, stage 4 (severe): Secondary | ICD-10-CM | POA: Diagnosis not present

## 2021-02-26 LAB — CBC WITH DIFFERENTIAL/PLATELET
Abs Immature Granulocytes: 0.09 10*3/uL — ABNORMAL HIGH (ref 0.00–0.07)
Basophils Absolute: 0.1 10*3/uL (ref 0.0–0.1)
Basophils Relative: 1 %
Eosinophils Absolute: 0.3 10*3/uL (ref 0.0–0.5)
Eosinophils Relative: 3 %
HCT: 30.8 % — ABNORMAL LOW (ref 36.0–46.0)
Hemoglobin: 9.4 g/dL — ABNORMAL LOW (ref 12.0–15.0)
Immature Granulocytes: 1 %
Lymphocytes Relative: 23 %
Lymphs Abs: 2.7 10*3/uL (ref 0.7–4.0)
MCH: 30.5 pg (ref 26.0–34.0)
MCHC: 30.5 g/dL (ref 30.0–36.0)
MCV: 100 fL (ref 80.0–100.0)
Monocytes Absolute: 0.7 10*3/uL (ref 0.1–1.0)
Monocytes Relative: 6 %
Neutro Abs: 8.1 10*3/uL — ABNORMAL HIGH (ref 1.7–7.7)
Neutrophils Relative %: 66 %
Platelets: 194 10*3/uL (ref 150–400)
RBC: 3.08 MIL/uL — ABNORMAL LOW (ref 3.87–5.11)
RDW: 15.6 % — ABNORMAL HIGH (ref 11.5–15.5)
WBC: 11.9 10*3/uL — ABNORMAL HIGH (ref 4.0–10.5)
nRBC: 0 % (ref 0.0–0.2)

## 2021-02-26 LAB — RENAL FUNCTION PANEL
Albumin: 2.6 g/dL — ABNORMAL LOW (ref 3.5–5.0)
Anion gap: 7 (ref 5–15)
BUN: 34 mg/dL — ABNORMAL HIGH (ref 8–23)
CO2: 24 mmol/L (ref 22–32)
Calcium: 9.2 mg/dL (ref 8.9–10.3)
Chloride: 105 mmol/L (ref 98–111)
Creatinine, Ser: 2.14 mg/dL — ABNORMAL HIGH (ref 0.44–1.00)
GFR, Estimated: 22 mL/min — ABNORMAL LOW (ref >60)
Glucose, Bld: 89 mg/dL (ref 70–99)
Phosphorus: 2.8 mg/dL (ref 2.5–4.6)
Potassium: 4.5 mmol/L (ref 3.5–5.1)
Sodium: 136 mmol/L (ref 135–145)

## 2021-02-26 LAB — IRON AND TIBC
Iron: 60 ug/dL (ref 28–170)
Saturation Ratios: 21 % (ref 10.4–31.8)
TIBC: 284 ug/dL (ref 250–450)
UIBC: 224 ug/dL

## 2021-02-26 LAB — POCT HEMOGLOBIN-HEMACUE: Hemoglobin: 9 g/dL — ABNORMAL LOW (ref 12.0–15.0)

## 2021-02-26 MED ORDER — EPOETIN ALFA-EPBX 3000 UNIT/ML IJ SOLN
INTRAMUSCULAR | Status: AC
  Administered 2021-02-26: 3000 [IU]
  Filled 2021-02-26: qty 1

## 2021-02-26 MED ORDER — EPOETIN ALFA-EPBX 3000 UNIT/ML IJ SOLN: 3000.0000 [IU] | Freq: Once | INTRAMUSCULAR | Status: AC

## 2021-02-27 LAB — PTH, INTACT AND CALCIUM
Calcium, Total (PTH): 9.3 mg/dL (ref 8.7–10.3)
PTH: 25 pg/mL (ref 15–65)

## 2021-03-12 ENCOUNTER — Other Ambulatory Visit: Payer: Self-pay

## 2021-03-12 ENCOUNTER — Encounter (HOSPITAL_COMMUNITY): Payer: Self-pay

## 2021-03-12 ENCOUNTER — Encounter (HOSPITAL_COMMUNITY)
Admission: RE | Admit: 2021-03-12 | Discharge: 2021-03-12 | Disposition: A | Discharge status: Home / Self Care | Payer: Medicare Other | Source: Ambulatory Visit | Attending: Nephrology | Admitting: Nephrology

## 2021-03-12 DIAGNOSIS — N184 Chronic kidney disease, stage 4 (severe): Secondary | ICD-10-CM | POA: Diagnosis not present

## 2021-03-12 DIAGNOSIS — N189 Chronic kidney disease, unspecified: Secondary | ICD-10-CM | POA: Diagnosis not present

## 2021-03-12 DIAGNOSIS — D631 Anemia in chronic kidney disease: Secondary | ICD-10-CM | POA: Diagnosis not present

## 2021-03-12 DIAGNOSIS — I129 Hypertensive chronic kidney disease with stage 1 through stage 4 chronic kidney disease, or unspecified chronic kidney disease: Secondary | ICD-10-CM | POA: Diagnosis not present

## 2021-03-12 DIAGNOSIS — I5032 Chronic diastolic (congestive) heart failure: Secondary | ICD-10-CM | POA: Diagnosis not present

## 2021-03-12 LAB — POCT HEMOGLOBIN-HEMACUE: Hemoglobin: 9.3 g/dL — ABNORMAL LOW (ref 12.0–15.0)

## 2021-03-12 LAB — PROTEIN / CREATININE RATIO, URINE
Creatinine, Urine: 47.83 mg/dL
Total Protein, Urine: <6 mg/dL

## 2021-03-12 MED ORDER — EPOETIN ALFA-EPBX 3000 UNIT/ML IJ SOLN
INTRAMUSCULAR | Status: AC
  Administered 2021-03-12: 3000 [IU] via SUBCUTANEOUS
  Filled 2021-03-12: qty 1

## 2021-03-12 MED ORDER — EPOETIN ALFA-EPBX 3000 UNIT/ML IJ SOLN
3000.0000 [IU] | Freq: Once | INTRAMUSCULAR | Status: AC
Start: 2021-03-12 — End: 2021-03-12

## 2021-03-17 DIAGNOSIS — J449 Chronic obstructive pulmonary disease, unspecified: Secondary | ICD-10-CM | POA: Diagnosis not present

## 2021-03-17 DIAGNOSIS — E782 Mixed hyperlipidemia: Secondary | ICD-10-CM | POA: Diagnosis not present

## 2021-03-17 DIAGNOSIS — I10 Essential (primary) hypertension: Secondary | ICD-10-CM | POA: Diagnosis not present

## 2021-03-17 DIAGNOSIS — N184 Chronic kidney disease, stage 4 (severe): Secondary | ICD-10-CM | POA: Diagnosis not present

## 2021-03-17 DIAGNOSIS — D631 Anemia in chronic kidney disease: Secondary | ICD-10-CM | POA: Diagnosis not present

## 2021-03-17 DIAGNOSIS — I2510 Atherosclerotic heart disease of native coronary artery without angina pectoris: Secondary | ICD-10-CM | POA: Diagnosis not present

## 2021-03-26 ENCOUNTER — Other Ambulatory Visit: Payer: Self-pay

## 2021-03-26 ENCOUNTER — Encounter (HOSPITAL_COMMUNITY)
Admission: RE | Admit: 2021-03-26 | Discharge: 2021-03-26 | Disposition: A | Discharge status: Home / Self Care | Payer: Medicare Other | Source: Ambulatory Visit | Attending: Nephrology | Admitting: Nephrology

## 2021-03-26 ENCOUNTER — Encounter (HOSPITAL_COMMUNITY): Payer: Self-pay

## 2021-03-26 DIAGNOSIS — N184 Chronic kidney disease, stage 4 (severe): Secondary | ICD-10-CM | POA: Diagnosis not present

## 2021-03-26 LAB — POCT HEMOGLOBIN-HEMACUE: Hemoglobin: 10.2 g/dL — ABNORMAL LOW (ref 12.0–15.0)

## 2021-03-26 MED ORDER — EPOETIN ALFA-EPBX 3000 UNIT/ML IJ SOLN: 3000.0000 [IU] | Freq: Once | INTRAMUSCULAR | Status: DC

## 2021-04-09 ENCOUNTER — Other Ambulatory Visit: Payer: Self-pay

## 2021-04-09 ENCOUNTER — Encounter (HOSPITAL_COMMUNITY)
Admission: RE | Admit: 2021-04-09 | Discharge: 2021-04-09 | Disposition: A | Discharge status: Home / Self Care | Payer: Medicare Other | Source: Ambulatory Visit | Attending: Nephrology | Admitting: Nephrology

## 2021-04-09 DIAGNOSIS — N184 Chronic kidney disease, stage 4 (severe): Secondary | ICD-10-CM | POA: Diagnosis not present

## 2021-04-09 DIAGNOSIS — J449 Chronic obstructive pulmonary disease, unspecified: Secondary | ICD-10-CM | POA: Diagnosis not present

## 2021-04-09 DIAGNOSIS — I110 Hypertensive heart disease with heart failure: Secondary | ICD-10-CM | POA: Diagnosis not present

## 2021-04-09 DIAGNOSIS — I5032 Chronic diastolic (congestive) heart failure: Secondary | ICD-10-CM | POA: Diagnosis not present

## 2021-04-09 DIAGNOSIS — Z87891 Personal history of nicotine dependence: Secondary | ICD-10-CM | POA: Diagnosis not present

## 2021-04-09 LAB — POCT HEMOGLOBIN-HEMACUE: Hemoglobin: 10.1 g/dL — ABNORMAL LOW (ref 12.0–15.0)

## 2021-04-09 MED ORDER — EPOETIN ALFA-EPBX 3000 UNIT/ML IJ SOLN: 3000.0000 [IU] | Freq: Once | INTRAMUSCULAR | Status: DC

## 2021-04-23 ENCOUNTER — Encounter (HOSPITAL_COMMUNITY): Payer: Self-pay

## 2021-04-23 ENCOUNTER — Other Ambulatory Visit: Payer: Self-pay

## 2021-04-23 ENCOUNTER — Encounter (HOSPITAL_COMMUNITY)
Admission: RE | Admit: 2021-04-23 | Discharge: 2021-04-23 | Disposition: A | Discharge status: Home / Self Care | Payer: Medicare Other | Source: Ambulatory Visit | Attending: Nephrology | Admitting: Nephrology

## 2021-04-23 DIAGNOSIS — D631 Anemia in chronic kidney disease: Secondary | ICD-10-CM | POA: Diagnosis not present

## 2021-04-23 DIAGNOSIS — N184 Chronic kidney disease, stage 4 (severe): Secondary | ICD-10-CM | POA: Diagnosis not present

## 2021-04-23 LAB — POCT HEMOGLOBIN-HEMACUE: Hemoglobin: 9.4 g/dL — ABNORMAL LOW (ref 12.0–15.0)

## 2021-04-23 MED ORDER — EPOETIN ALFA-EPBX 3000 UNIT/ML IJ SOLN
3000.0000 [IU] | Freq: Once | INTRAMUSCULAR | Status: AC
  Administered 2021-04-23: 3000 [IU] via SUBCUTANEOUS
  Filled 2021-04-23: qty 1

## 2021-05-07 ENCOUNTER — Encounter (HOSPITAL_COMMUNITY)
Admission: RE | Admit: 2021-05-07 | Discharge: 2021-05-07 | Disposition: A | Discharge status: Home / Self Care | Payer: Medicare Other | Source: Ambulatory Visit | Attending: Nephrology | Admitting: Nephrology

## 2021-05-21 ENCOUNTER — Encounter (HOSPITAL_COMMUNITY)
Admission: RE | Admit: 2021-05-21 | Discharge: 2021-05-21 | Disposition: A | Discharge status: Home / Self Care | Payer: Medicare Other | Source: Ambulatory Visit | Attending: Nephrology | Admitting: Nephrology

## 2021-05-21 DIAGNOSIS — D631 Anemia in chronic kidney disease: Secondary | ICD-10-CM | POA: Insufficient documentation

## 2021-05-21 DIAGNOSIS — R809 Proteinuria, unspecified: Secondary | ICD-10-CM | POA: Insufficient documentation

## 2021-05-21 DIAGNOSIS — N184 Chronic kidney disease, stage 4 (severe): Secondary | ICD-10-CM | POA: Insufficient documentation

## 2021-05-21 DIAGNOSIS — I129 Hypertensive chronic kidney disease with stage 1 through stage 4 chronic kidney disease, or unspecified chronic kidney disease: Secondary | ICD-10-CM | POA: Insufficient documentation

## 2021-06-01 IMAGING — US US RENAL
1 series · 14 of 25 positions shown · non-contrast
Comparison: 09/26/2014

CLINICAL DATA: Stage III B chronic kidney disease

EXAM:
RENAL / URINARY TRACT ULTRASOUND COMPLETE

[Series 1: us renal · 14 of 55 slices shown]
[im 1/55]
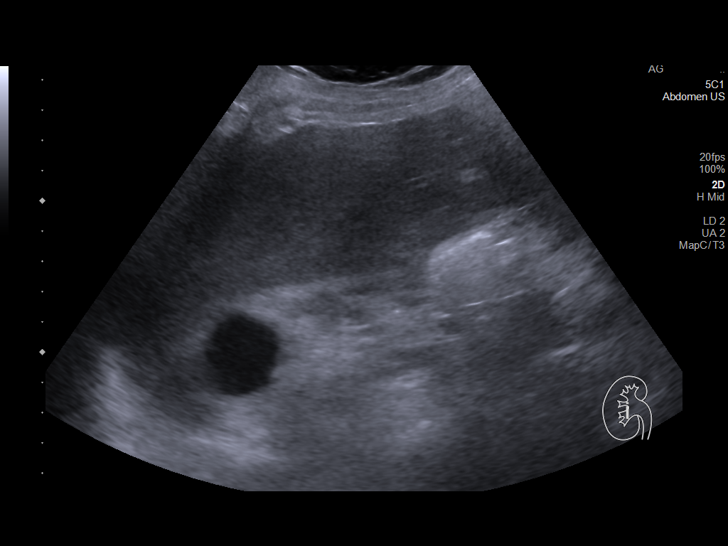
[im 5/55]
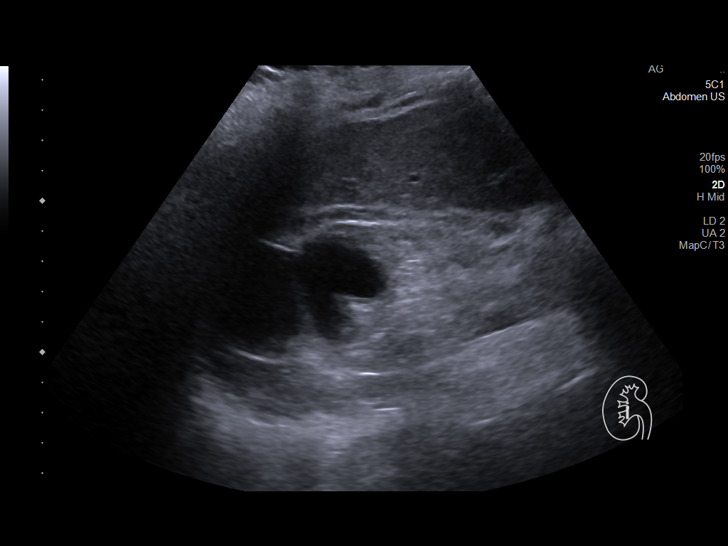
[im 10/55]
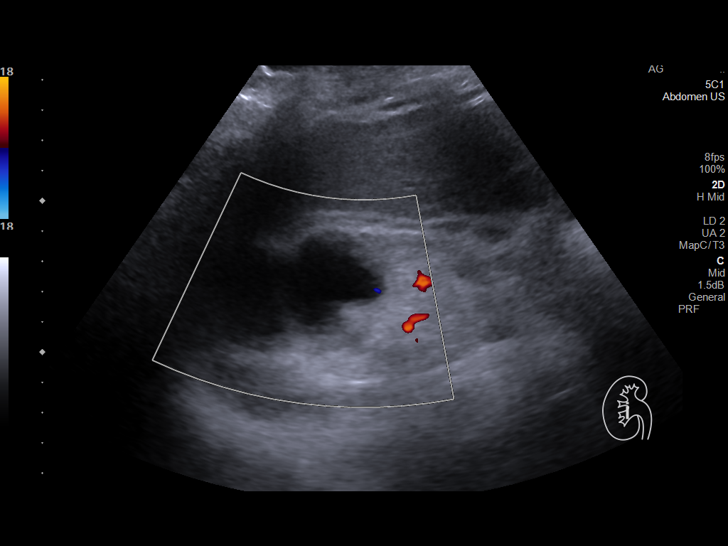
[im 14/55]
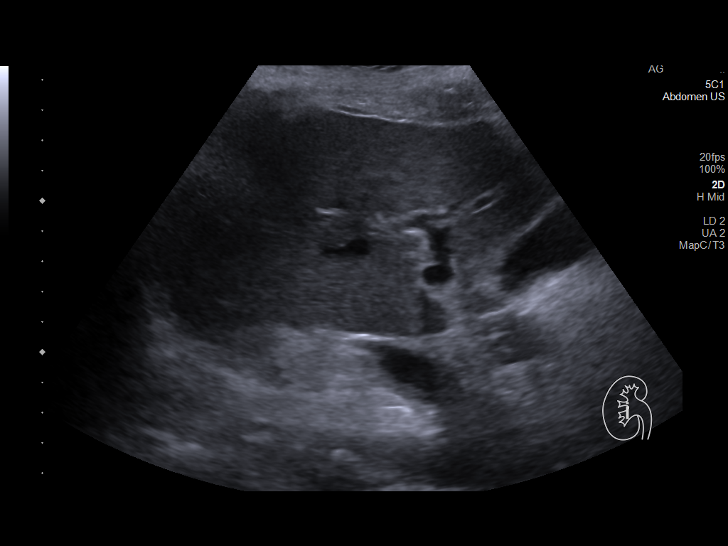
[im 19/55]
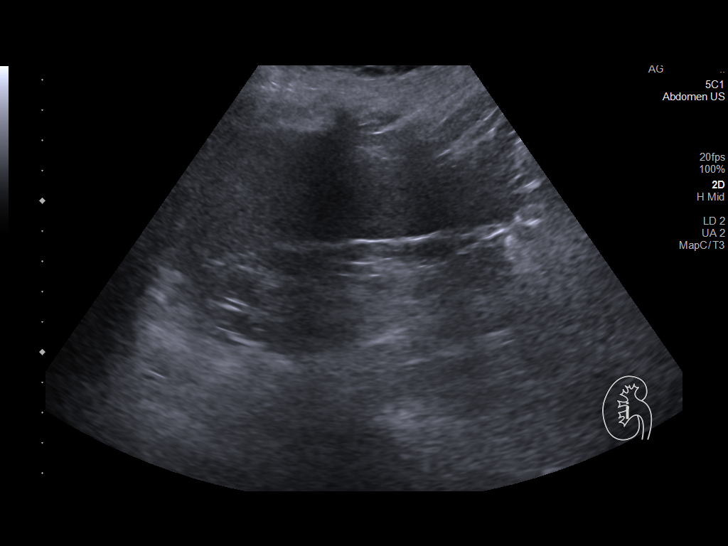
[im 21/55]
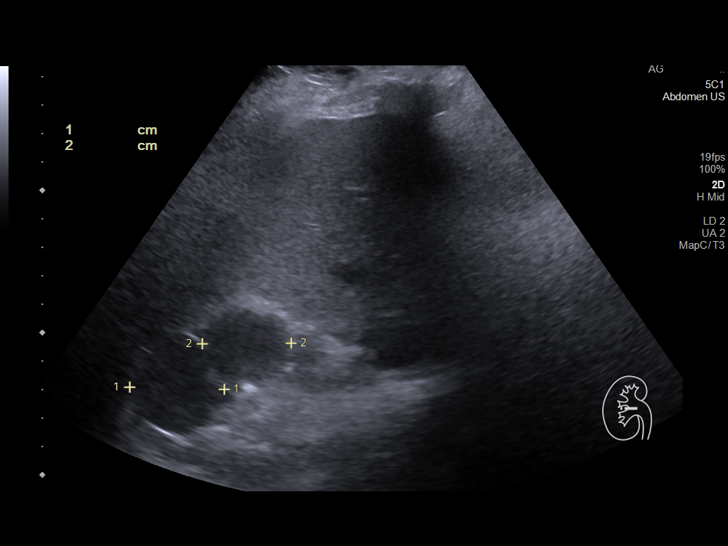
[im 25/55]
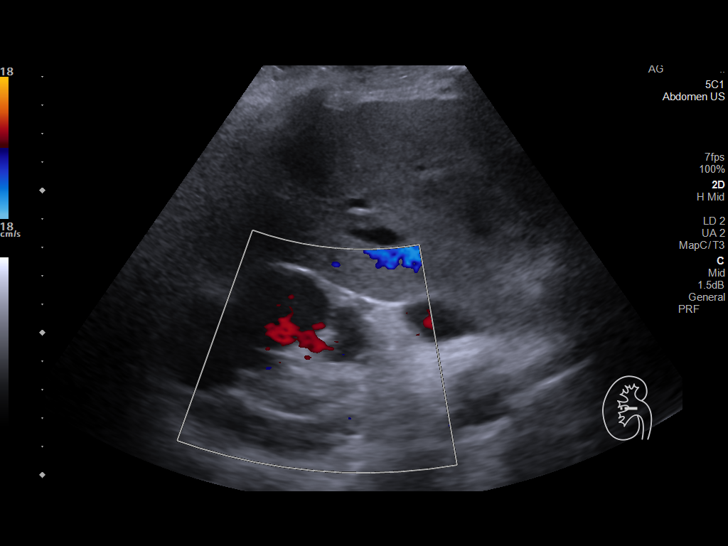
[im 30/55]
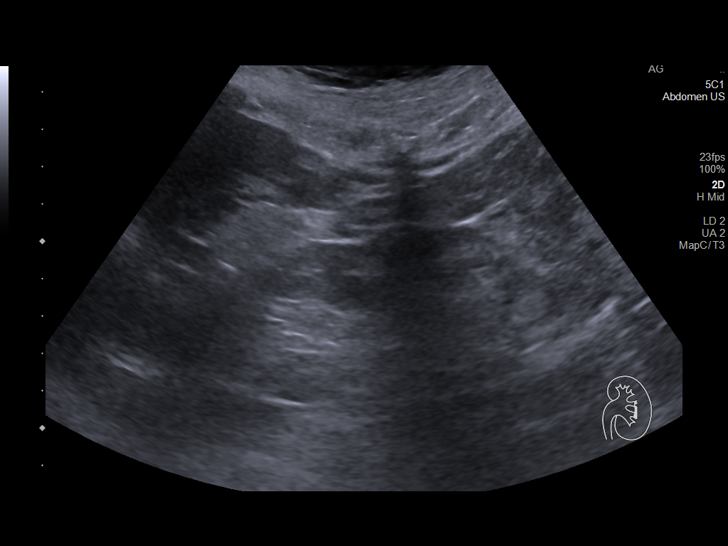
[im 34/55]
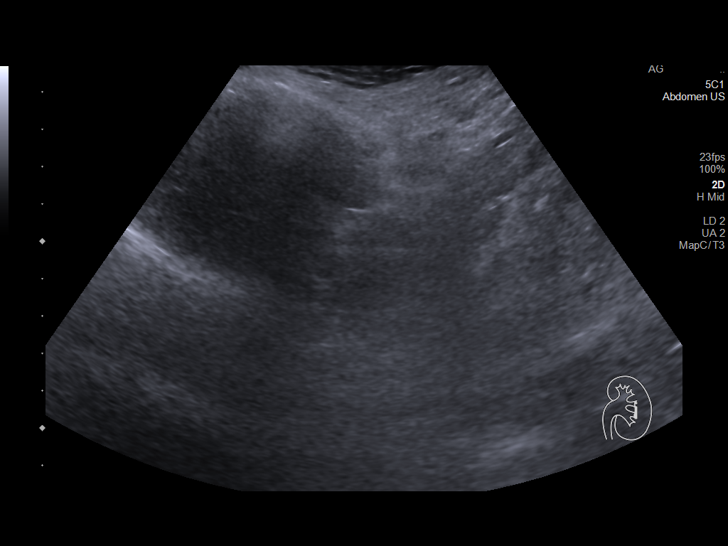
[im 37/55]
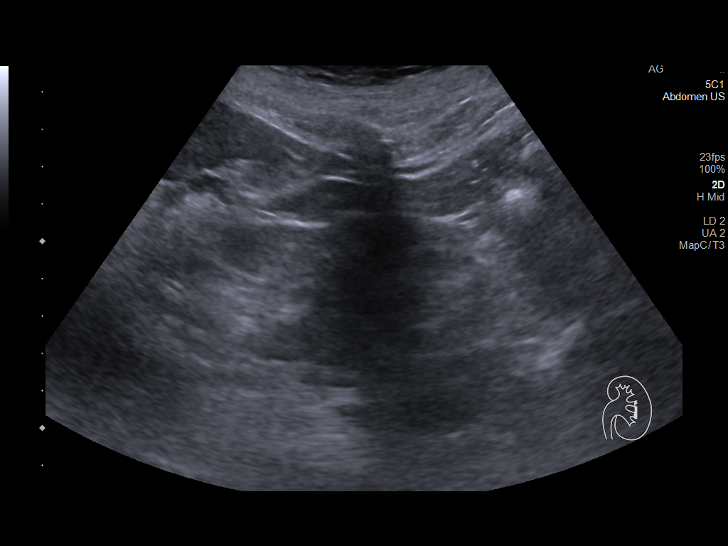
[im 41/55]
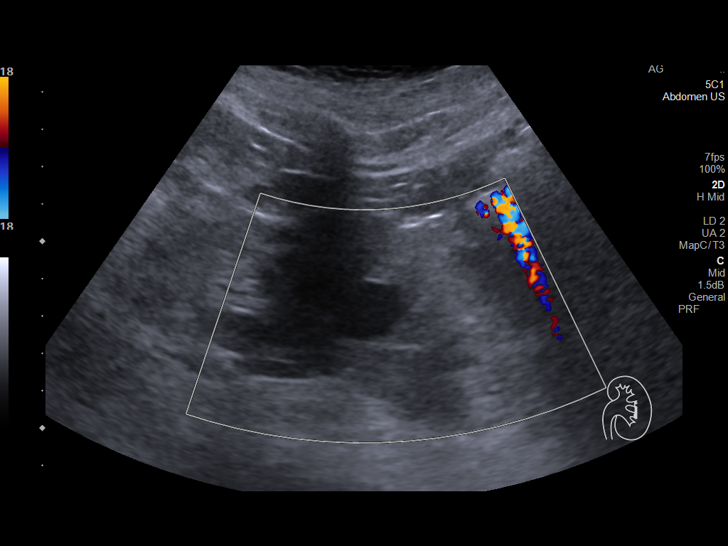
[im 46/55]
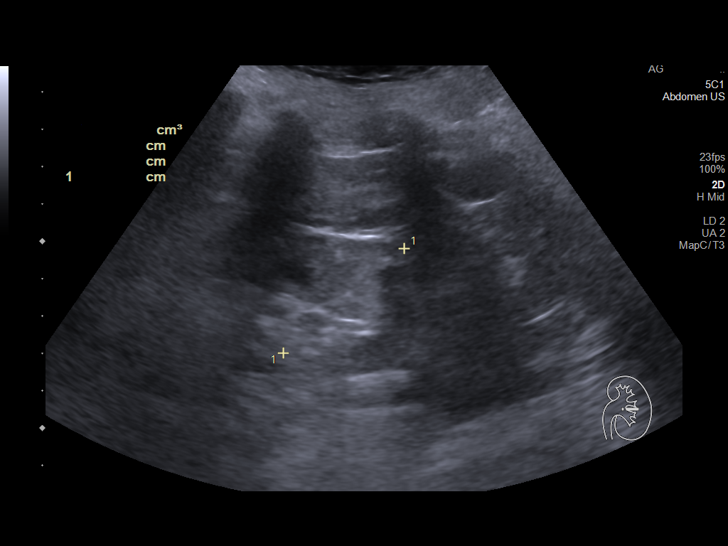
[im 50/55]
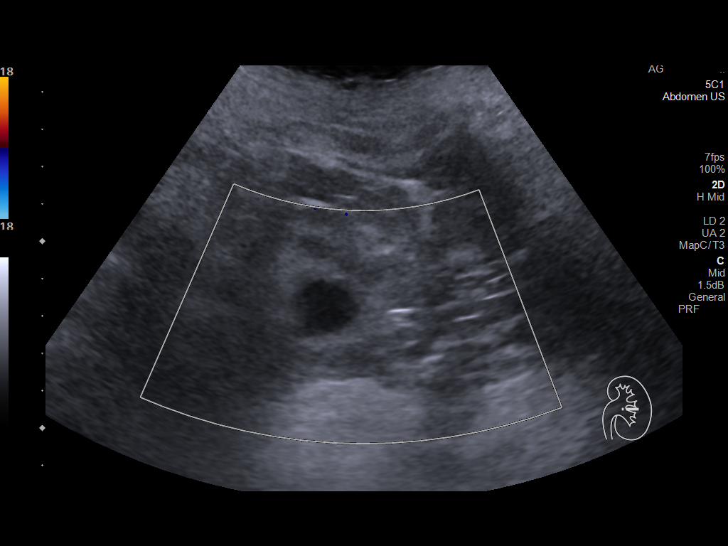
[im 55/55]
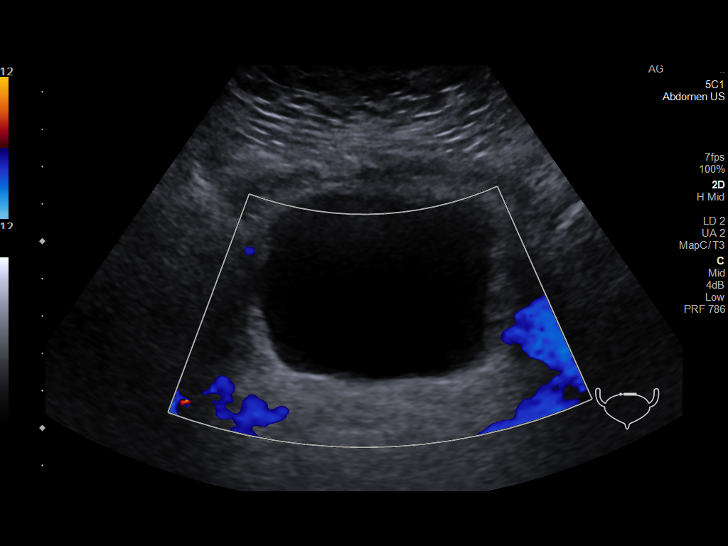

[14 of 25 positions shown; findings below may reference images not displayed]

FINDINGS: Right Kidney:

Renal measurements: 8.0 x 4.0 x 5.5 cm = volume: 92 mL. 2 cysts in
the upper pole of the right kidney, the largest measuring 4.0 cm.
These appear benign. Cortical thinning and increased echotexture. No
hydronephrosis.

Left Kidney:

Renal measurements: 8.5 x 4.0 x 4.3 cm = volume: 76 mL. 2 cm lower
pole cyst. Cortical thinning and increased echotexture. No
hydronephrosis.

Bladder:

Appears normal for degree of bladder distention.

Other:

None.
IMPRESSION: Cortical thinning and increased echotexture compatible with chronic
medical renal disease.

No hydronephrosis.

## 2021-06-04 ENCOUNTER — Other Ambulatory Visit: Payer: Self-pay

## 2021-06-04 ENCOUNTER — Encounter (HOSPITAL_COMMUNITY)
Admission: RE | Admit: 2021-06-04 | Discharge: 2021-06-04 | Disposition: A | Discharge status: Home / Self Care | Payer: Medicare Other | Source: Ambulatory Visit | Attending: Nephrology | Admitting: Nephrology

## 2021-06-04 DIAGNOSIS — R809 Proteinuria, unspecified: Secondary | ICD-10-CM | POA: Diagnosis not present

## 2021-06-04 DIAGNOSIS — E875 Hyperkalemia: Secondary | ICD-10-CM | POA: Diagnosis not present

## 2021-06-04 DIAGNOSIS — N184 Chronic kidney disease, stage 4 (severe): Secondary | ICD-10-CM | POA: Diagnosis not present

## 2021-06-04 DIAGNOSIS — I129 Hypertensive chronic kidney disease with stage 1 through stage 4 chronic kidney disease, or unspecified chronic kidney disease: Secondary | ICD-10-CM | POA: Diagnosis not present

## 2021-06-04 DIAGNOSIS — I5032 Chronic diastolic (congestive) heart failure: Secondary | ICD-10-CM | POA: Diagnosis not present

## 2021-06-04 DIAGNOSIS — D631 Anemia in chronic kidney disease: Secondary | ICD-10-CM | POA: Diagnosis not present

## 2021-06-04 DIAGNOSIS — N189 Chronic kidney disease, unspecified: Secondary | ICD-10-CM | POA: Diagnosis not present

## 2021-06-04 LAB — CBC WITH DIFFERENTIAL/PLATELET
Abs Immature Granulocytes: 0.1 10*3/uL — ABNORMAL HIGH (ref 0.00–0.07)
Basophils Absolute: 0.1 10*3/uL (ref 0.0–0.1)
Basophils Relative: 0 %
Eosinophils Absolute: 0.3 10*3/uL (ref 0.0–0.5)
Eosinophils Relative: 2 %
HCT: 33.8 % — ABNORMAL LOW (ref 36.0–46.0)
Hemoglobin: 10.3 g/dL — ABNORMAL LOW (ref 12.0–15.0)
Immature Granulocytes: 1 %
Lymphocytes Relative: 20 %
Lymphs Abs: 2.5 10*3/uL (ref 0.7–4.0)
MCH: 31.3 pg (ref 26.0–34.0)
MCHC: 30.5 g/dL (ref 30.0–36.0)
MCV: 102.7 fL — ABNORMAL HIGH (ref 80.0–100.0)
Monocytes Absolute: 0.7 10*3/uL (ref 0.1–1.0)
Monocytes Relative: 6 %
Neutro Abs: 8.7 10*3/uL — ABNORMAL HIGH (ref 1.7–7.7)
Neutrophils Relative %: 71 %
Platelets: 183 10*3/uL (ref 150–400)
RBC: 3.29 MIL/uL — ABNORMAL LOW (ref 3.87–5.11)
RDW: 15.7 % — ABNORMAL HIGH (ref 11.5–15.5)
WBC: 12.2 10*3/uL — ABNORMAL HIGH (ref 4.0–10.5)
nRBC: 0 % (ref 0.0–0.2)

## 2021-06-04 LAB — POCT HEMOGLOBIN-HEMACUE: Hemoglobin: 10.1 g/dL — ABNORMAL LOW (ref 12.0–15.0)

## 2021-06-04 LAB — RENAL FUNCTION PANEL
Albumin: 3.7 g/dL (ref 3.5–5.0)
Anion gap: 7 (ref 5–15)
BUN: 44 mg/dL — ABNORMAL HIGH (ref 8–23)
CO2: 24 mmol/L (ref 22–32)
Calcium: 8.9 mg/dL (ref 8.9–10.3)
Chloride: 106 mmol/L (ref 98–111)
Creatinine, Ser: 2.23 mg/dL — ABNORMAL HIGH (ref 0.44–1.00)
GFR, Estimated: 21 mL/min — ABNORMAL LOW (ref >60)
Glucose, Bld: 83 mg/dL (ref 70–99)
Phosphorus: 3.5 mg/dL (ref 2.5–4.6)
Potassium: 5.8 mmol/L — ABNORMAL HIGH (ref 3.5–5.1)
Sodium: 137 mmol/L (ref 135–145)

## 2021-06-04 LAB — IRON AND TIBC
Iron: 51 ug/dL (ref 28–170)
Saturation Ratios: 17 % (ref 10.4–31.8)
TIBC: 301 ug/dL (ref 250–450)
UIBC: 250 ug/dL

## 2021-06-04 MED ORDER — EPOETIN ALFA-EPBX 3000 UNIT/ML IJ SOLN: 3000.0000 [IU] | Freq: Once | INTRAMUSCULAR | Status: DC

## 2021-06-05 LAB — PTH, INTACT AND CALCIUM
Calcium, Total (PTH): 9.6 mg/dL (ref 8.7–10.3)
PTH: 56 pg/mL (ref 15–65)

## 2021-06-10 DIAGNOSIS — Z87891 Personal history of nicotine dependence: Secondary | ICD-10-CM | POA: Diagnosis not present

## 2021-06-10 DIAGNOSIS — I5032 Chronic diastolic (congestive) heart failure: Secondary | ICD-10-CM | POA: Diagnosis not present

## 2021-06-10 DIAGNOSIS — J449 Chronic obstructive pulmonary disease, unspecified: Secondary | ICD-10-CM | POA: Diagnosis not present

## 2021-06-10 DIAGNOSIS — I110 Hypertensive heart disease with heart failure: Secondary | ICD-10-CM | POA: Diagnosis not present

## 2021-06-18 ENCOUNTER — Other Ambulatory Visit: Payer: Self-pay

## 2021-06-18 ENCOUNTER — Encounter (HOSPITAL_COMMUNITY)
Admission: RE | Admit: 2021-06-18 | Discharge: 2021-06-18 | Disposition: A | Discharge status: Home / Self Care | Payer: Medicare Other | Source: Ambulatory Visit | Attending: Nephrology | Admitting: Nephrology

## 2021-06-18 DIAGNOSIS — N184 Chronic kidney disease, stage 4 (severe): Secondary | ICD-10-CM | POA: Insufficient documentation

## 2021-06-18 DIAGNOSIS — D631 Anemia in chronic kidney disease: Secondary | ICD-10-CM | POA: Diagnosis not present

## 2021-06-18 DIAGNOSIS — E875 Hyperkalemia: Secondary | ICD-10-CM | POA: Insufficient documentation

## 2021-06-18 DIAGNOSIS — I5032 Chronic diastolic (congestive) heart failure: Secondary | ICD-10-CM | POA: Diagnosis not present

## 2021-06-18 DIAGNOSIS — I129 Hypertensive chronic kidney disease with stage 1 through stage 4 chronic kidney disease, or unspecified chronic kidney disease: Secondary | ICD-10-CM | POA: Insufficient documentation

## 2021-06-18 LAB — POTASSIUM: Potassium: 4.3 mmol/L (ref 3.5–5.1)

## 2021-06-18 LAB — POCT HEMOGLOBIN-HEMACUE: Hemoglobin: 9.4 g/dL — ABNORMAL LOW (ref 12.0–15.0)

## 2021-06-18 MED ORDER — EPOETIN ALFA-EPBX 3000 UNIT/ML IJ SOLN
INTRAMUSCULAR | Status: AC
  Filled 2021-06-18: qty 1

## 2021-06-18 MED ORDER — EPOETIN ALFA-EPBX 3000 UNIT/ML IJ SOLN
3000.0000 [IU] | Freq: Once | INTRAMUSCULAR | Status: AC
  Administered 2021-06-18: 3000 [IU] via SUBCUTANEOUS

## 2021-06-23 DIAGNOSIS — N184 Chronic kidney disease, stage 4 (severe): Secondary | ICD-10-CM | POA: Diagnosis not present

## 2021-06-23 DIAGNOSIS — I129 Hypertensive chronic kidney disease with stage 1 through stage 4 chronic kidney disease, or unspecified chronic kidney disease: Secondary | ICD-10-CM | POA: Diagnosis not present

## 2021-06-23 DIAGNOSIS — E782 Mixed hyperlipidemia: Secondary | ICD-10-CM | POA: Diagnosis not present

## 2021-06-23 DIAGNOSIS — D631 Anemia in chronic kidney disease: Secondary | ICD-10-CM | POA: Diagnosis not present

## 2021-06-23 DIAGNOSIS — J449 Chronic obstructive pulmonary disease, unspecified: Secondary | ICD-10-CM | POA: Diagnosis not present

## 2021-06-23 DIAGNOSIS — I2510 Atherosclerotic heart disease of native coronary artery without angina pectoris: Secondary | ICD-10-CM | POA: Diagnosis not present

## 2021-06-24 NOTE — Progress Notes (Signed)
Cardiology Office Note  Date: 06/25/2021   ID: Alison [name], DOB 06-26-1931, MRN [id number]  PCP:  [name], MD  Cardiologist:  Rozann Lesches, MD Electrophysiologist:  None   Chief Complaint  Patient presents with   Cardiac follow-up    History of Present Illness: Alison [name] is an 87 y.o. female last seen in February.  She is here today with family member for a routine visit.  Weight has been relatively stable, she continues to have fluctuating leg edema but has been on a stable dose of Lasix.  She continues to see Dr. Theador Hawthorne, her most recent creatinine was 2.23, potassium normal.  She continues on Retacrit injections for treatment of anemia.  I went over her medications which are outlined below.  She reports compliance with therapy.  Blood pressure today is reasonably controlled.  I personally reviewed her ECG which shows sinus rhythm with poor R wave progression rule out old anteroseptal infarct.  She does not describe any active angina symptoms.  Past Medical History:  Diagnosis Date   Chronic anemia    Chronic back pain    Chronic kidney disease    COPD (chronic obstructive pulmonary disease) (HCC)    Coronary atherosclerosis of native coronary artery    Occluded circ with collaterals and 90% RCA (previous stents at [facility], details not clear), LVEF 48% 1/11 - inferior and inferolateral scar by Myoview 1/11,    Gout    History of blood transfusion    History of pneumonia    Hyperlipidemia    Hypertension    Myocardial infarction ([place]) 1997   Nocturia    Osteoarthritis    Pulmonary embolism ([place])    History of recurrent DVT and PE status post IVC filter, previously on Coumadin    Past Surgical History:  Procedure Laterality Date   ABDOMINAL HYSTERECTOMY  1965   partial   APPENDECTOMY     BACK SURGERY     cataract surgery Bilateral    CORONARY ANGIOPLASTY  1997   3 stents   cyst removed from back  2006   [employer] 1 N/A 09/10/2016   Procedure: Posterior Lateral Fusion - Lumbar four-Lumbar five, lumbar laminectomy and pedicle screw fixation  - Lumbar four-Lumbar five;  Surgeon: Eustace Moore, MD;  Location: [facility];  Service: Neurosurgery;  Laterality: N/A;   Left adrenalectomy  2009   Right breast biopsy  2008   Right knee replacement  2011    Current Outpatient Medications  Medication Sig Dispense Refill   acetaminophen (TYLENOL) 650 MG CR tablet Take 650 mg by mouth 2 (two) times daily.      ADVAIR DISKUS 250-50 MCG/DOSE AEPB Inhale 1 Inhaler into the lungs 2 (two) times daily.     allopurinol (ZYLOPRIM) 100 MG tablet Take 100 mg by mouth daily.     calcitRIOL (ROCALTROL) 0.25 MCG capsule Take 0.25 mcg by mouth daily.     Calcium Carb-Cholecalciferol (CALCIUM 600+D3 PO) Take 1 tablet by mouth daily.     clopidogrel (PLAVIX) 75 MG tablet TAKE 1 TABLET BY MOUTH  DAILY 90 tablet 3   epoetin alfa-epbx (RETACRIT) 3000 UNIT/ML injection Inject 3,000 Units into the skin every 14 (fourteen) days.     ferrous sulfate 325 (65 FE) MG tablet Take 325 mg by mouth daily.      furosemide (LASIX) 20 MG tablet TAKE 1 TABLET BY MOUTH  TWICE DAILY MAY TAKE 1  EXTRA TABLET DAILY  AS  NEEDED FOR SWELLING OR  SHORTNESS OF BREATH 270 tablet 3   isosorbide mononitrate (IMDUR) 30 MG 24 hr tablet TAKE 1 TABLET BY MOUTH  DAILY 90 tablet 3   losartan (COZAAR) 50 MG tablet Take 25 mg by mouth daily.     metoprolol succinate (TOPROL-XL) 25 MG 24 hr tablet TAKE 1 TABLET BY MOUTH  DAILY 90 tablet 3   rosuvastatin (CRESTOR) 10 MG tablet TAKE 1 TABLET BY MOUTH  DAILY 90 tablet 3   epoetin alfa (EPOGEN) 3000 UNIT/ML injection Inject 3,000 Units into the vein every 14 (fourteen) days. (Patient not taking: Reported on 06/25/2021)     epoetin alfa-epbx (RETACRIT) 3000 UNIT/ML injection Inject 3,000 Units into the skin every 14 (fourteen) days. (Patient not taking: Reported on 06/25/2021)     losartan (COZAAR) 50 MG tablet  Take 1 tablet (50 mg total) by mouth daily. 90 tablet 3   nitroGLYCERIN (NITROSTAT) 0.4 MG SL tablet Place 1 tablet (0.4 mg total) under the tongue every 5 (five) minutes as needed for chest pain. 25 tablet 3   potassium chloride (KLOR-CON) 10 MEQ tablet TAKE 1 TABLET BY MOUTH  DAILY 90 tablet 3   No current facility-administered medications for this visit.   Allergies:  Patient has no known allergies.   ROS: No palpitations.  Hearing loss.  Physical Exam: VS:  BP 138/86   Pulse 67   Ht 5\' 1"  (1.549 m)   Wt 220 lb (99.8 kg)   SpO2 94%   BMI 41.57 kg/m , BMI Body mass index is 41.57 kg/m.  Wt Readings from Last 3 Encounters:  06/25/21 220 lb (99.8 kg)  06/04/21 218 lb 14.7 oz (99.3 kg)  04/23/21 219 lb (99.3 kg)    General: Elderly woman.  Patient appears comfortable at rest. HEENT: Conjunctiva and lids normal, . Neck: Supple, no elevated JVP or carotid bruits, no thyromegaly. Lungs: Clear to auscultation, nonlabored breathing at rest. Cardiac: Regular rate and rhythm, no S3, 1/6 systolic murmur. Extremities: Stable, chronic appearing edema.  ECG:  An ECG dated 01/24/2020 was personally reviewed today and demonstrated:  Sinus rhythm with PAC, left anterior fascicular block, nonspecific T wave changes.  Recent Labwork: 06/04/2021: BUN 44; Creatinine, Ser 2.23; Platelets 183; Sodium 137 06/18/2021: Hemoglobin 9.4; Potassium 4.3   Other Studies Reviewed Today:  Echocardiogram 01/24/2020:  1. Left ventricular ejection fraction, by estimation, is 45 to 50%. The  left ventricle has normal function. The left ventricle has no regional  wall motion abnormalities. There is mild left ventricular hypertrophy.  Left ventricular diastolic parameters  are consistent with Grade I diastolic dysfunction (impaired relaxation).  Elevated left atrial pressure.   2. Right ventricular systolic function is normal. The right ventricular  size is normal. There is moderately elevated pulmonary artery  systolic  pressure.   3. Left atrial size was severely dilated.   4. Right atrial size was mildly dilated.   5. The mitral valve is normal in structure. Mild mitral valve  regurgitation. No evidence of mitral stenosis.   6. The aortic valve is tricuspid. Aortic valve regurgitation is not  visualized. No aortic stenosis is present.   7. Mild pulmonary HTN, PASP is 39 mmHg.   8. The inferior vena cava is normal in size with greater than 50%  respiratory variability, suggesting right atrial pressure of 3 mmHg.   Lexiscan Myoview 02/15/2020: No diagnostic ST segment changes to indicate ischemia. Medium sized, severe intensity, fixed defect involving the apical anteroseptal  wall, and more predominantly the entire inferolateral wall. These regions are consistent with scar, no large ischemic territory. This is an intermediate risk study. Nuclear stress EF: 40%  Assessment and Plan:  1.  Chronic combined heart failure with LVEF 45 to 50% range.  Weight is stable.  She is currently on Cozaar, Toprol-XL, and Lasix with medical therapy limited by chronic kidney disease.  No changes were made today.  2.  CKD stage IV, followed by Dr. Theador Hawthorne.  Recent creatinine 2.23 with normal potassium.  3.  CAD with known occlusion of the circumflex associated with collateral flow as well as previous RCA stent intervention.  She does not report any active angina at this time with low-level activity.  Currently on Plavix, Toprol-XL, Imdur, and Crestor.  Lexiscan Myoview in May of last year was overall intermediate risk showing moderate sized region of scar however no significant ischemia.  Continue medical therapy.  Medication Adjustments/Labs and Tests Ordered: Current medicines are reviewed at length with the patient today.  Concerns regarding medicines are outlined above.   Tests Ordered: No orders of the defined types were placed in this encounter.   Medication Changes: No orders of the defined types were  placed in this encounter.   Disposition:  Follow up  6 months.  Signed, Satira Sark, MD, [facility] 06/25/2021 2:13 PM    [facility] at [facility] [address], [place], [place] [address] Phone: [phone]; Fax: [phone]

## 2021-06-25 ENCOUNTER — Ambulatory Visit: Payer: Medicare Other | Admitting: Cardiology

## 2021-06-25 ENCOUNTER — Other Ambulatory Visit: Payer: Self-pay

## 2021-06-25 ENCOUNTER — Encounter: Payer: Self-pay | Admitting: Cardiology

## 2021-06-25 VITALS — BP 138/86 | HR 67 | Ht 61.0 in | Wt 220.0 lb

## 2021-06-25 DIAGNOSIS — I5042 Chronic combined systolic (congestive) and diastolic (congestive) heart failure: Secondary | ICD-10-CM

## 2021-06-25 DIAGNOSIS — N184 Chronic kidney disease, stage 4 (severe): Secondary | ICD-10-CM | POA: Diagnosis not present

## 2021-06-25 DIAGNOSIS — I25119 Atherosclerotic heart disease of native coronary artery with unspecified angina pectoris: Secondary | ICD-10-CM | POA: Diagnosis not present

## 2021-06-25 NOTE — Patient Instructions (Signed)

## 2021-06-25 NOTE — Addendum Note (Signed)
Addended by: Barbarann Ehlers A on: 06/25/2021 03:23 PM   Modules accepted: Orders

## 2021-07-02 ENCOUNTER — Other Ambulatory Visit: Payer: Self-pay

## 2021-07-02 ENCOUNTER — Encounter (HOSPITAL_COMMUNITY)
Admission: RE | Admit: 2021-07-02 | Discharge: 2021-07-02 | Disposition: A | Discharge status: Home / Self Care | Payer: Medicare Other | Source: Ambulatory Visit | Attending: Nephrology | Admitting: Nephrology

## 2021-07-02 ENCOUNTER — Encounter (HOSPITAL_COMMUNITY): Payer: Self-pay

## 2021-07-02 DIAGNOSIS — D631 Anemia in chronic kidney disease: Secondary | ICD-10-CM | POA: Diagnosis not present

## 2021-07-02 DIAGNOSIS — N184 Chronic kidney disease, stage 4 (severe): Secondary | ICD-10-CM | POA: Diagnosis not present

## 2021-07-02 DIAGNOSIS — I129 Hypertensive chronic kidney disease with stage 1 through stage 4 chronic kidney disease, or unspecified chronic kidney disease: Secondary | ICD-10-CM | POA: Diagnosis not present

## 2021-07-02 DIAGNOSIS — I5032 Chronic diastolic (congestive) heart failure: Secondary | ICD-10-CM | POA: Diagnosis not present

## 2021-07-02 DIAGNOSIS — E875 Hyperkalemia: Secondary | ICD-10-CM | POA: Diagnosis not present

## 2021-07-02 LAB — POCT HEMOGLOBIN-HEMACUE: Hemoglobin: 9.6 g/dL — ABNORMAL LOW (ref 12.0–15.0)

## 2021-07-02 MED ORDER — EPOETIN ALFA-EPBX 3000 UNIT/ML IJ SOLN
INTRAMUSCULAR | Status: AC
  Filled 2021-07-02: qty 1

## 2021-07-02 MED ORDER — EPOETIN ALFA-EPBX 3000 UNIT/ML IJ SOLN
3000.0000 [IU] | Freq: Once | INTRAMUSCULAR | Status: AC
Start: 2021-07-02 — End: 2021-07-02
  Administered 2021-07-02: 3000 [IU] via SUBCUTANEOUS

## 2021-07-10 DIAGNOSIS — I110 Hypertensive heart disease with heart failure: Secondary | ICD-10-CM | POA: Diagnosis not present

## 2021-07-10 DIAGNOSIS — Z87891 Personal history of nicotine dependence: Secondary | ICD-10-CM | POA: Diagnosis not present

## 2021-07-10 DIAGNOSIS — J449 Chronic obstructive pulmonary disease, unspecified: Secondary | ICD-10-CM | POA: Diagnosis not present

## 2021-07-10 DIAGNOSIS — I5032 Chronic diastolic (congestive) heart failure: Secondary | ICD-10-CM | POA: Diagnosis not present

## 2021-07-16 ENCOUNTER — Encounter (HOSPITAL_COMMUNITY): Payer: Self-pay

## 2021-07-16 ENCOUNTER — Encounter (HOSPITAL_COMMUNITY)
Admission: RE | Admit: 2021-07-16 | Discharge: 2021-07-16 | Disposition: A | Discharge status: Home / Self Care | Payer: Medicare Other | Source: Ambulatory Visit | Attending: Nephrology | Admitting: Nephrology

## 2021-07-16 DIAGNOSIS — N184 Chronic kidney disease, stage 4 (severe): Secondary | ICD-10-CM | POA: Insufficient documentation

## 2021-07-16 DIAGNOSIS — I129 Hypertensive chronic kidney disease with stage 1 through stage 4 chronic kidney disease, or unspecified chronic kidney disease: Secondary | ICD-10-CM | POA: Diagnosis not present

## 2021-07-16 DIAGNOSIS — D631 Anemia in chronic kidney disease: Secondary | ICD-10-CM | POA: Diagnosis not present

## 2021-07-16 DIAGNOSIS — I5032 Chronic diastolic (congestive) heart failure: Secondary | ICD-10-CM | POA: Insufficient documentation

## 2021-07-16 DIAGNOSIS — E875 Hyperkalemia: Secondary | ICD-10-CM | POA: Diagnosis not present

## 2021-07-16 LAB — RENAL FUNCTION PANEL
Albumin: 3.3 g/dL — ABNORMAL LOW (ref 3.5–5.0)
Anion gap: 8 (ref 5–15)
BUN: 27 mg/dL — ABNORMAL HIGH (ref 8–23)
CO2: 25 mmol/L (ref 22–32)
Calcium: 8.7 mg/dL — ABNORMAL LOW (ref 8.9–10.3)
Chloride: 105 mmol/L (ref 98–111)
Creatinine, Ser: 1.66 mg/dL — ABNORMAL HIGH (ref 0.44–1.00)
GFR, Estimated: 29 mL/min — ABNORMAL LOW (ref >60)
Glucose, Bld: 88 mg/dL (ref 70–99)
Phosphorus: 2.9 mg/dL (ref 2.5–4.6)
Potassium: 4.3 mmol/L (ref 3.5–5.1)
Sodium: 138 mmol/L (ref 135–145)

## 2021-07-16 LAB — POCT HEMOGLOBIN-HEMACUE: Hemoglobin: 9.5 g/dL — ABNORMAL LOW (ref 12.0–15.0)

## 2021-07-16 LAB — CBC
HCT: 29 % — ABNORMAL LOW (ref 36.0–46.0)
Hemoglobin: 9.2 g/dL — ABNORMAL LOW (ref 12.0–15.0)
MCH: 32.2 pg (ref 26.0–34.0)
MCHC: 31.7 g/dL (ref 30.0–36.0)
MCV: 101.4 fL — ABNORMAL HIGH (ref 80.0–100.0)
Platelets: 173 10*3/uL (ref 150–400)
RBC: 2.86 MIL/uL — ABNORMAL LOW (ref 3.87–5.11)
RDW: 15.3 % (ref 11.5–15.5)
WBC: 11.2 10*3/uL — ABNORMAL HIGH (ref 4.0–10.5)
nRBC: 0 % (ref 0.0–0.2)

## 2021-07-16 LAB — IRON AND TIBC
Iron: 43 ug/dL (ref 28–170)
Saturation Ratios: 16 % (ref 10.4–31.8)
TIBC: 273 ug/dL (ref 250–450)
UIBC: 230 ug/dL

## 2021-07-16 MED ORDER — EPOETIN ALFA-EPBX 3000 UNIT/ML IJ SOLN
INTRAMUSCULAR | Status: AC
  Filled 2021-07-16: qty 1

## 2021-07-16 MED ORDER — EPOETIN ALFA-EPBX 3000 UNIT/ML IJ SOLN
3000.0000 [IU] | Freq: Once | INTRAMUSCULAR | Status: AC
Start: 2021-07-16 — End: 2021-07-16
  Administered 2021-07-16: 3000 [IU] via SUBCUTANEOUS

## 2021-07-17 LAB — PTH, INTACT AND CALCIUM
Calcium, Total (PTH): 9.1 mg/dL (ref 8.7–10.3)
PTH: 100 pg/mL — ABNORMAL HIGH (ref 15–65)

## 2021-07-30 ENCOUNTER — Encounter (HOSPITAL_COMMUNITY)
Admission: RE | Admit: 2021-07-30 | Discharge: 2021-07-30 | Disposition: A | Discharge status: Home / Self Care | Payer: Medicare Other | Source: Ambulatory Visit | Attending: Nephrology | Admitting: Nephrology

## 2021-07-30 ENCOUNTER — Encounter (HOSPITAL_COMMUNITY): Payer: Self-pay

## 2021-07-30 DIAGNOSIS — I5032 Chronic diastolic (congestive) heart failure: Secondary | ICD-10-CM | POA: Diagnosis not present

## 2021-07-30 DIAGNOSIS — I129 Hypertensive chronic kidney disease with stage 1 through stage 4 chronic kidney disease, or unspecified chronic kidney disease: Secondary | ICD-10-CM | POA: Diagnosis not present

## 2021-07-30 DIAGNOSIS — D631 Anemia in chronic kidney disease: Secondary | ICD-10-CM | POA: Diagnosis not present

## 2021-07-30 DIAGNOSIS — E875 Hyperkalemia: Secondary | ICD-10-CM | POA: Diagnosis not present

## 2021-07-30 DIAGNOSIS — N184 Chronic kidney disease, stage 4 (severe): Secondary | ICD-10-CM | POA: Diagnosis not present

## 2021-07-30 LAB — POCT HEMOGLOBIN-HEMACUE: Hemoglobin: 9.8 g/dL — ABNORMAL LOW (ref 12.0–15.0)

## 2021-07-30 MED ORDER — EPOETIN ALFA-EPBX 3000 UNIT/ML IJ SOLN
INTRAMUSCULAR | Status: AC
  Filled 2021-07-30: qty 1

## 2021-07-30 MED ORDER — EPOETIN ALFA-EPBX 3000 UNIT/ML IJ SOLN
3000.0000 [IU] | Freq: Once | INTRAMUSCULAR | Status: AC
Start: 2021-07-30 — End: 2021-07-30
  Administered 2021-07-30: 3000 [IU] via SUBCUTANEOUS

## 2021-08-06 DIAGNOSIS — D631 Anemia in chronic kidney disease: Secondary | ICD-10-CM | POA: Diagnosis not present

## 2021-08-06 DIAGNOSIS — N184 Chronic kidney disease, stage 4 (severe): Secondary | ICD-10-CM | POA: Diagnosis not present

## 2021-08-06 DIAGNOSIS — E875 Hyperkalemia: Secondary | ICD-10-CM | POA: Diagnosis not present

## 2021-08-06 DIAGNOSIS — I129 Hypertensive chronic kidney disease with stage 1 through stage 4 chronic kidney disease, or unspecified chronic kidney disease: Secondary | ICD-10-CM | POA: Diagnosis not present

## 2021-08-06 DIAGNOSIS — I5032 Chronic diastolic (congestive) heart failure: Secondary | ICD-10-CM | POA: Diagnosis not present

## 2021-08-06 DIAGNOSIS — N189 Chronic kidney disease, unspecified: Secondary | ICD-10-CM | POA: Diagnosis not present

## 2021-08-10 DIAGNOSIS — I5032 Chronic diastolic (congestive) heart failure: Secondary | ICD-10-CM | POA: Diagnosis not present

## 2021-08-10 DIAGNOSIS — I110 Hypertensive heart disease with heart failure: Secondary | ICD-10-CM | POA: Diagnosis not present

## 2021-08-10 DIAGNOSIS — J449 Chronic obstructive pulmonary disease, unspecified: Secondary | ICD-10-CM | POA: Diagnosis not present

## 2021-08-13 ENCOUNTER — Encounter (HOSPITAL_COMMUNITY)
Admission: RE | Admit: 2021-08-13 | Discharge: 2021-08-13 | Disposition: A | Discharge status: Home / Self Care | Payer: Medicare Other | Source: Ambulatory Visit | Attending: Nephrology | Admitting: Nephrology

## 2021-08-13 ENCOUNTER — Other Ambulatory Visit: Payer: Self-pay

## 2021-08-13 DIAGNOSIS — N184 Chronic kidney disease, stage 4 (severe): Secondary | ICD-10-CM | POA: Insufficient documentation

## 2021-08-13 DIAGNOSIS — D631 Anemia in chronic kidney disease: Secondary | ICD-10-CM | POA: Diagnosis not present

## 2021-08-13 LAB — POCT HEMOGLOBIN-HEMACUE: Hemoglobin: 10 g/dL — ABNORMAL LOW (ref 12.0–15.0)

## 2021-08-13 MED ORDER — EPOETIN ALFA-EPBX 3000 UNIT/ML IJ SOLN
3000.0000 [IU] | Freq: Once | INTRAMUSCULAR | Status: DC
Start: 2021-08-13 — End: 2021-08-13

## 2021-08-18 ENCOUNTER — Emergency Department (HOSPITAL_COMMUNITY): Payer: Medicare Other

## 2021-08-18 ENCOUNTER — Inpatient Hospital Stay (HOSPITAL_COMMUNITY)
Admission: EM | Admit: 2021-08-18 | Discharge: 2021-08-25 | DRG: 280 | Disposition: A | Discharge status: Hospice | Payer: Medicare Other | Attending: Family Medicine | Admitting: Family Medicine

## 2021-08-18 DIAGNOSIS — E875 Hyperkalemia: Secondary | ICD-10-CM | POA: Diagnosis not present

## 2021-08-18 DIAGNOSIS — R7989 Other specified abnormal findings of blood chemistry: Secondary | ICD-10-CM | POA: Diagnosis present

## 2021-08-18 DIAGNOSIS — I130 Hypertensive heart and chronic kidney disease with heart failure and stage 1 through stage 4 chronic kidney disease, or unspecified chronic kidney disease: Secondary | ICD-10-CM | POA: Diagnosis present

## 2021-08-18 DIAGNOSIS — I2510 Atherosclerotic heart disease of native coronary artery without angina pectoris: Secondary | ICD-10-CM | POA: Diagnosis present

## 2021-08-18 DIAGNOSIS — I272 Pulmonary hypertension, unspecified: Secondary | ICD-10-CM | POA: Diagnosis not present

## 2021-08-18 DIAGNOSIS — I214 Non-ST elevation (NSTEMI) myocardial infarction: Secondary | ICD-10-CM | POA: Diagnosis not present

## 2021-08-18 DIAGNOSIS — J189 Pneumonia, unspecified organism: Secondary | ICD-10-CM | POA: Diagnosis not present

## 2021-08-18 DIAGNOSIS — I252 Old myocardial infarction: Secondary | ICD-10-CM | POA: Diagnosis not present

## 2021-08-18 DIAGNOSIS — I5042 Chronic combined systolic (congestive) and diastolic (congestive) heart failure: Secondary | ICD-10-CM | POA: Diagnosis not present

## 2021-08-18 DIAGNOSIS — N184 Chronic kidney disease, stage 4 (severe): Secondary | ICD-10-CM | POA: Diagnosis present

## 2021-08-18 DIAGNOSIS — Z66 Do not resuscitate: Secondary | ICD-10-CM | POA: Diagnosis present

## 2021-08-18 DIAGNOSIS — N189 Chronic kidney disease, unspecified: Secondary | ICD-10-CM | POA: Diagnosis not present

## 2021-08-18 DIAGNOSIS — Z86711 Personal history of pulmonary embolism: Secondary | ICD-10-CM | POA: Diagnosis not present

## 2021-08-18 DIAGNOSIS — Z7902 Long term (current) use of antithrombotics/antiplatelets: Secondary | ICD-10-CM | POA: Diagnosis not present

## 2021-08-18 DIAGNOSIS — J440 Chronic obstructive pulmonary disease with acute lower respiratory infection: Secondary | ICD-10-CM | POA: Diagnosis present

## 2021-08-18 DIAGNOSIS — Z87891 Personal history of nicotine dependence: Secondary | ICD-10-CM

## 2021-08-18 DIAGNOSIS — N179 Acute kidney failure, unspecified: Secondary | ICD-10-CM

## 2021-08-18 DIAGNOSIS — I472 Ventricular tachycardia, unspecified: Secondary | ICD-10-CM | POA: Diagnosis not present

## 2021-08-18 DIAGNOSIS — I5032 Chronic diastolic (congestive) heart failure: Secondary | ICD-10-CM | POA: Diagnosis not present

## 2021-08-18 DIAGNOSIS — R531 Weakness: Secondary | ICD-10-CM | POA: Diagnosis not present

## 2021-08-18 DIAGNOSIS — Z7401 Bed confinement status: Secondary | ICD-10-CM | POA: Diagnosis not present

## 2021-08-18 DIAGNOSIS — R0602 Shortness of breath: Secondary | ICD-10-CM | POA: Diagnosis not present

## 2021-08-18 DIAGNOSIS — J9691 Respiratory failure, unspecified with hypoxia: Secondary | ICD-10-CM | POA: Diagnosis present

## 2021-08-18 DIAGNOSIS — Z7189 Other specified counseling: Secondary | ICD-10-CM | POA: Diagnosis not present

## 2021-08-18 DIAGNOSIS — N2581 Secondary hyperparathyroidism of renal origin: Secondary | ICD-10-CM | POA: Diagnosis present

## 2021-08-18 DIAGNOSIS — Z95828 Presence of other vascular implants and grafts: Secondary | ICD-10-CM

## 2021-08-18 DIAGNOSIS — M2550 Pain in unspecified joint: Secondary | ICD-10-CM | POA: Diagnosis not present

## 2021-08-18 DIAGNOSIS — Z743 Need for continuous supervision: Secondary | ICD-10-CM | POA: Diagnosis not present

## 2021-08-18 DIAGNOSIS — E872 Acidosis, unspecified: Secondary | ICD-10-CM | POA: Diagnosis not present

## 2021-08-18 DIAGNOSIS — N170 Acute kidney failure with tubular necrosis: Secondary | ICD-10-CM | POA: Diagnosis present

## 2021-08-18 DIAGNOSIS — R6889 Other general symptoms and signs: Secondary | ICD-10-CM | POA: Diagnosis not present

## 2021-08-18 DIAGNOSIS — Z20822 Contact with and (suspected) exposure to covid-19: Secondary | ICD-10-CM | POA: Diagnosis present

## 2021-08-18 DIAGNOSIS — N281 Cyst of kidney, acquired: Secondary | ICD-10-CM | POA: Diagnosis not present

## 2021-08-18 DIAGNOSIS — D631 Anemia in chronic kidney disease: Secondary | ICD-10-CM | POA: Diagnosis present

## 2021-08-18 DIAGNOSIS — J449 Chronic obstructive pulmonary disease, unspecified: Secondary | ICD-10-CM | POA: Diagnosis present

## 2021-08-18 DIAGNOSIS — Z981 Arthrodesis status: Secondary | ICD-10-CM | POA: Diagnosis not present

## 2021-08-18 DIAGNOSIS — D72829 Elevated white blood cell count, unspecified: Secondary | ICD-10-CM | POA: Diagnosis not present

## 2021-08-18 DIAGNOSIS — I517 Cardiomegaly: Secondary | ICD-10-CM | POA: Diagnosis not present

## 2021-08-18 DIAGNOSIS — Z79899 Other long term (current) drug therapy: Secondary | ICD-10-CM | POA: Diagnosis not present

## 2021-08-18 DIAGNOSIS — Z823 Family history of stroke: Secondary | ICD-10-CM

## 2021-08-18 DIAGNOSIS — Z96651 Presence of right artificial knee joint: Secondary | ICD-10-CM | POA: Diagnosis present

## 2021-08-18 DIAGNOSIS — I499 Cardiac arrhythmia, unspecified: Secondary | ICD-10-CM | POA: Diagnosis not present

## 2021-08-18 DIAGNOSIS — E782 Mixed hyperlipidemia: Secondary | ICD-10-CM | POA: Diagnosis present

## 2021-08-18 DIAGNOSIS — I129 Hypertensive chronic kidney disease with stage 1 through stage 4 chronic kidney disease, or unspecified chronic kidney disease: Secondary | ICD-10-CM | POA: Diagnosis not present

## 2021-08-18 DIAGNOSIS — I213 ST elevation (STEMI) myocardial infarction of unspecified site: Secondary | ICD-10-CM | POA: Diagnosis not present

## 2021-08-18 DIAGNOSIS — R079 Chest pain, unspecified: Secondary | ICD-10-CM | POA: Diagnosis not present

## 2021-08-18 DIAGNOSIS — Z515 Encounter for palliative care: Secondary | ICD-10-CM | POA: Diagnosis not present

## 2021-08-18 LAB — LIPASE, BLOOD: Lipase: 30 U/L (ref 11–51)

## 2021-08-18 LAB — RESP PANEL BY RT-PCR (FLU A&B, COVID) ARPGX2
Influenza A by PCR: NEGATIVE
Influenza B by PCR: NEGATIVE
SARS Coronavirus 2 by RT PCR: NEGATIVE

## 2021-08-18 LAB — COMPREHENSIVE METABOLIC PANEL
ALT: 26 U/L (ref 0–44)
AST: 51 U/L — ABNORMAL HIGH (ref 15–41)
Albumin: 2.9 g/dL — ABNORMAL LOW (ref 3.5–5.0)
Alkaline Phosphatase: 66 U/L (ref 38–126)
Anion gap: 13 (ref 5–15)
BUN: 43 mg/dL — ABNORMAL HIGH (ref 8–23)
CO2: 19 mmol/L — ABNORMAL LOW (ref 22–32)
Calcium: 9 mg/dL (ref 8.9–10.3)
Chloride: 103 mmol/L (ref 98–111)
Creatinine, Ser: 2.12 mg/dL — ABNORMAL HIGH (ref 0.44–1.00)
GFR, Estimated: 22 mL/min — ABNORMAL LOW (ref >60)
Glucose, Bld: 124 mg/dL — ABNORMAL HIGH (ref 70–99)
Potassium: 4.3 mmol/L (ref 3.5–5.1)
Sodium: 135 mmol/L (ref 135–145)
Total Bilirubin: 0.8 mg/dL (ref 0.3–1.2)
Total Protein: 6.8 g/dL (ref 6.5–8.1)

## 2021-08-18 LAB — CBC
HCT: 28.5 % — ABNORMAL LOW (ref 36.0–46.0)
Hemoglobin: 9 g/dL — ABNORMAL LOW (ref 12.0–15.0)
MCH: 30.7 pg (ref 26.0–34.0)
MCHC: 31.6 g/dL (ref 30.0–36.0)
MCV: 97.3 fL (ref 80.0–100.0)
Platelets: 172 10*3/uL (ref 150–400)
RBC: 2.93 MIL/uL — ABNORMAL LOW (ref 3.87–5.11)
RDW: 14.9 % (ref 11.5–15.5)
WBC: 20.2 10*3/uL — ABNORMAL HIGH (ref 4.0–10.5)
nRBC: 0 % (ref 0.0–0.2)

## 2021-08-18 LAB — TROPONIN I (HIGH SENSITIVITY): Troponin I (High Sensitivity): 8194 ng/L (ref <18)

## 2021-08-18 LAB — D-DIMER, QUANTITATIVE: D-Dimer, Quant: 1.64 ug/mL-FEU — ABNORMAL HIGH (ref 0.00–0.50)

## 2021-08-18 LAB — CBG MONITORING, ED: Glucose-Capillary: 119 mg/dL — ABNORMAL HIGH (ref 70–99)

## 2021-08-18 MED ORDER — HEPARIN (PORCINE) 25000 UT/250ML-% IV SOLN
1350.0000 [IU]/h | INTRAVENOUS | Status: AC
  Administered 2021-08-18: 950 [IU]/h via INTRAVENOUS
  Administered 2021-08-19: 1250 [IU]/h via INTRAVENOUS
  Administered 2021-08-20: 1350 [IU]/h via INTRAVENOUS
  Filled 2021-08-18 (×3): qty 250

## 2021-08-18 MED ORDER — ASPIRIN EC 81 MG PO TBEC
81.0000 mg | DELAYED_RELEASE_TABLET | Freq: Every day | ORAL | Status: DC
  Administered 2021-08-19 – 2021-08-25 (×7): 81 mg via ORAL
  Filled 2021-08-18 (×7): qty 1

## 2021-08-18 MED ORDER — SODIUM CHLORIDE 0.9 % IV SOLN
1.0000 g | Freq: Once | INTRAVENOUS | Status: AC
  Administered 2021-08-18: 1 g via INTRAVENOUS
  Filled 2021-08-18: qty 10

## 2021-08-18 MED ORDER — HEPARIN BOLUS VIA INFUSION
4000.0000 [IU] | Freq: Once | INTRAVENOUS | Status: AC
  Administered 2021-08-18: 4000 [IU] via INTRAVENOUS
  Filled 2021-08-18: qty 4000

## 2021-08-18 MED ORDER — NITROGLYCERIN 2 % TD OINT
1.0000 [in_us] | TOPICAL_OINTMENT | Freq: Once | TRANSDERMAL | Status: AC
  Administered 2021-08-18: 1 [in_us] via TOPICAL
  Filled 2021-08-18: qty 1

## 2021-08-18 MED ORDER — ACETAMINOPHEN 325 MG PO TABS
650.0000 mg | ORAL_TABLET | ORAL | Status: DC | PRN
  Administered 2021-08-19 – 2021-08-24 (×3): 650 mg via ORAL
  Filled 2021-08-18 (×3): qty 2

## 2021-08-18 MED ORDER — NITROGLYCERIN 0.4 MG SL SUBL
0.4000 mg | SUBLINGUAL_TABLET | SUBLINGUAL | Status: DC | PRN
  Administered 2021-08-20: 0.4 mg via SUBLINGUAL
  Filled 2021-08-18: qty 1

## 2021-08-18 MED ORDER — AZITHROMYCIN 250 MG PO TABS
500.0000 mg | ORAL_TABLET | Freq: Once | ORAL | Status: AC
  Administered 2021-08-18: 500 mg via ORAL
  Filled 2021-08-18: qty 2

## 2021-08-18 MED ORDER — ASPIRIN 81 MG PO CHEW
324.0000 mg | CHEWABLE_TABLET | Freq: Once | ORAL | Status: AC
  Administered 2021-08-18: 324 mg via ORAL
  Filled 2021-08-18: qty 4

## 2021-08-18 MED ORDER — ROSUVASTATIN CALCIUM 20 MG PO TABS
20.0000 mg | ORAL_TABLET | Freq: Every day | ORAL | Status: DC
  Administered 2021-08-19 – 2021-08-21 (×4): 20 mg via ORAL
  Filled 2021-08-18 (×4): qty 1

## 2021-08-18 NOTE — Consult Note (Signed)
Cardiology Consultation:   Patient ID: JOPLIN CANTY MRN: [id number]; DOB: 1931/06/25  Admit date: 08/18/2021 Date of Consult: 08/19/2021  PCP:  Mirna Mires, MD   [facility] Providers Cardiologist:  Nona Dell, MD        Patient Profile:   Alison [name] is a 87 y.o. female with a hx of CAD s/p remote PCI (1970s), HTN, HLD, HFmrEF (EF = 45-50%), CKD 3 and prior PE who is being seen 08/19/2021 for the evaluation of elevated troponins at the request of Dr. Linwood Dibbles.  History of Present Illness:   The following history was obtained from the patient.  Alison [name] reports that 2 days ago she developed epigastric vs. substernal chest pain with associated neck pain, diaphoresis, and DOE.  Her pain is aching in quality, worse with inspiration and is severe.  Her symptoms improved so she did not present to the ED at that time.  This morning she was watching television and her symptoms recurred only this time more severe than previous prompting her to come to the ED for evaluation.  Of note, the patient endorses having DOE since April but her DOE has been worse over the past few days.  She further endorses having generalized fatigue and an episode of NBNB emesis x1 today.  She further reports having had an MI in the 1970s and having 2 stents placed although I do not see evidence of this in our chart.  She denies fevers, chills, SOB at rest, cough, rhinorrhea, sore throat, bleeding, urinary symptoms, weakness, numbness, palpitations, PND, orthopnea, or swelling.  She denies tobacco, alcohol, or illicit drug use.  In the ED her VS were T98.28F, HR 105, BP 115/70, RR 20, and satting 100% on RA.  Labs notable for WBC 20.2, hemoglobin 9, creatinine 2.12, troponin 8194 -> 8870, D-dimer 1.64.  There was initial concern for STEMI on EKG; however, STEMI criteria was not met by EKG.  CXR demonstrated cardiomegaly and airspace disease in the right lung base concerning for atelectasis versus  infiltrate.  Given the patient's elevated troponin she was loaded with aspirin and started on a heparin gtt. cardiology is consulted for evaluation.   Past Medical History:  Diagnosis Date   Chronic anemia    Chronic back pain    Chronic kidney disease    COPD (chronic obstructive pulmonary disease) (HCC)    Coronary atherosclerosis of native coronary artery    Occluded circ with collaterals and 90% RCA (previous stents at 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, previously on Coumadin    Past Surgical History:  Procedure Laterality Date   ABDOMINAL HYSTERECTOMY  1965   partial   APPENDECTOMY     BACK SURGERY     cataract surgery Bilateral    CORONARY ANGIOPLASTY  1997   3 stents   cyst removed from back  2006   LAMINECTOMY WITH POSTERIOR LATERAL ARTHRODESIS LEVEL 1 N/A 09/10/2016   Procedure: Posterior Lateral Fusion - Lumbar four-Lumbar five, lumbar laminectomy and pedicle screw fixation  - Lumbar four-Lumbar five;  Surgeon: Tia Alert, MD;  Location: [facility] OR;  Service: Neurosurgery;  Laterality: N/A;   Left adrenalectomy  2009   Right breast biopsy  2008   Right knee replacement  2011     Home Medications:  Prior to Admission medications   Medication Sig Start Date End Date Taking? Authorizing Provider  acetaminophen (TYLENOL) 650 MG CR tablet Take 650 mg by mouth 2 (two) times daily.     [provider]  ADVAIR DISKUS 250-50 MCG/DOSE AEPB Inhale 1 Inhaler into the lungs 2 (two) times daily. 09/10/12   [provider]  allopurinol (ZYLOPRIM) 100 MG tablet Take 100 mg by mouth daily.    [provider]  calcitRIOL (ROCALTROL) 0.25 MCG capsule Take  0.25 mcg by mouth daily.    [provider]  Calcium Carb-Cholecalciferol (CALCIUM 600+D3 PO) Take 1 tablet by mouth daily.    [provider]  clopidogrel (PLAVIX) 75 MG tablet TAKE 1 TABLET BY MOUTH  DAILY 12/01/20   Jonelle Sidle, MD  epoetin alfa (EPOGEN) 3000 UNIT/ML injection Inject 3,000 Units into the vein every 14 (fourteen) days. Patient not taking: Reported on 06/25/2021    Randa Lynn, MD  epoetin alfa-epbx (RETACRIT) 3000 UNIT/ML injection Inject 3,000 Units into the skin every 14 (fourteen) days. Patient not taking: Reported on 06/25/2021 10/17/20   Randa Lynn, MD  epoetin alfa-epbx (RETACRIT) 3000 UNIT/ML injection Inject 3,000 Units into the skin every 14 (fourteen) days.    Bhutani, Manpreet S, MD  ferrous sulfate 325 (65 FE) MG tablet Take 325 mg by mouth daily.     [provider]  furosemide (LASIX) 20 MG tablet TAKE 1 TABLET BY MOUTH  TWICE DAILY MAY TAKE 1  EXTRA TABLET DAILY AS  NEEDED FOR SWELLING OR  SHORTNESS OF BREATH 09/15/20   Netta Neat., NP  isosorbide mononitrate (IMDUR) 30 MG 24 hr tablet TAKE 1 TABLET BY MOUTH  DAILY 12/01/20   Jonelle Sidle, MD  losartan (COZAAR) 50 MG tablet Take 1 tablet (50 mg total) by mouth daily. 01/29/20   Netta Neat., NP  losartan (COZAAR) 50 MG tablet Take 25 mg by mouth daily.    [provider]  metoprolol succinate (TOPROL-XL) 25 MG 24 hr tablet TAKE 1 TABLET BY MOUTH  DAILY 12/01/20   Jonelle Sidle, MD  nitroGLYCERIN (NITROSTAT) 0.4 MG SL tablet Place 1 tablet (0.4 mg total) under the tongue every 5 (five) minutes as needed for chest pain. 12/04/20   Jonelle Sidle, MD  potassium chloride (KLOR-CON) 10 MEQ tablet TAKE 1 TABLET BY MOUTH  DAILY 12/01/20   Jonelle Sidle, MD  rosuvastatin (CRESTOR) 10 MG tablet TAKE 1 TABLET BY MOUTH  DAILY 12/18/20   Jonelle Sidle, MD    Inpatient Medications: Scheduled Meds:  aspirin EC  81 mg Oral Daily    rosuvastatin  20 mg Oral Daily   Continuous Infusions:  heparin 950 Units/hr (08/19/21 0100)   PRN Meds: acetaminophen, nitroGLYCERIN  Allergies:   No Known Allergies  Social History:   Social History   Socioeconomic History   Marital status: Widowed    Spouse name: Not on file   Number of children: Not on file   Years of education: Not on file   Highest education level: Not on file  Occupational History   Not on file  Tobacco Use   Smoking status: Former    Packs/day: 0.20    Types: Cigarettes    Quit date: 10/11/1977  Years since quitting: 43.8   Smokeless tobacco: Never  Vaping Use   Vaping Use: Never used  Substance and Sexual Activity   Alcohol use: No   Drug use: No   Sexual activity: Not on file  Other Topics Concern   Not on file  Social History Narrative   Not on file   Social Determinants of Health   Financial Resource Strain: Not on file  Food Insecurity: Not on file  Transportation Needs: Not on file  Physical Activity: Not on file  Stress: Not on file  Social Connections: Not on file  Intimate Partner Violence: Not on file    Family History:    Family History  Problem Relation Age of Onset   Stroke Father      ROS:  Please see the history of present illness.  All other ROS reviewed and negative.     Physical Exam/Data:   Vitals:   08/19/21 0046 08/19/21 0111 08/19/21 0114 08/19/21 0115  BP:  90/61 90/61 98/61   Pulse:  (!) 111 94 91  Resp:  20  20  Temp: 97.8 F (36.6 C) 97.6 F (36.4 C)    TempSrc: Oral Oral    SpO2:  100% 100% 99%  Weight:      Height:        Intake/Output Summary (Last 24 hours) at 08/19/2021 0119 Last data filed at 08/19/2021 0100 Gross per 24 hour  Intake 168.65 ml  Output --  Net 168.65 ml   Last 3 Weights 08/18/2021 07/30/2021 07/16/2021  Weight (lbs) 220 lb 220 lb 220 lb  Weight (kg) 99.791 kg 99.791 kg 99.791 kg     Body mass index is 41.57 kg/m.  General:  Well nourished, well developed, in no  acute distress, very pleasant HEENT: Atraumatic, normocephalic, MMM, hard of hearing Neck: no JVD Vascular: Distal pulses 2+ bilaterally Cardiac:  normal S1, S2; RRR; no murmur  Lungs:  clear to auscultation bilaterally, no wheezing, rhonchi or rales  Abd: soft, nontender, no hepatomegaly  Ext: no edema Musculoskeletal:  No deformities, BUE and BLE strength normal and equal Skin: warm and dry  Neuro:  CNs 2-12 intact, no focal abnormalities noted Psych:  Normal affect   EKG:        Telemetry:  Telemetry was personally reviewed and demonstrates: NSR  Relevant CV Studies:  TTE 01/24/20:  IMPRESSIONS     1. Left ventricular ejection fraction, by estimation, is 45 to 50%. The  left ventricle has normal function. The left ventricle has no regional  wall motion abnormalities. There is mild left ventricular hypertrophy.  Left ventricular diastolic parameters  are consistent with Grade I diastolic dysfunction (impaired relaxation).  Elevated left atrial pressure.   2. Right ventricular systolic function is normal. The right ventricular  size is normal. There is moderately elevated pulmonary artery systolic  pressure.   3. Left atrial size was severely dilated.   4. Right atrial size was mildly dilated.   5. The mitral valve is normal in structure. Mild mitral valve  regurgitation. No evidence of mitral stenosis.   6. The aortic valve is tricuspid. Aortic valve regurgitation is not  visualized. No aortic stenosis is present.   7. Mild pulmonary HTN, PASP is 39 mmHg.   8. The inferior vena cava is normal in size with greater than 50%  respiratory variability, suggesting right atrial pressure of 3 mmHg.  Laboratory Data:  High Sensitivity Troponin:   Recent Labs  Lab 08/18/21 1953 08/18/21 2116  TROPONINIHS 8,194* 8,870*     Chemistry Recent Labs  Lab 08/18/21 1953  NA 135  K 4.3  CL 103  CO2 19*  GLUCOSE 124*  BUN 43*  CREATININE 2.12*  CALCIUM 9.0  GFRNONAA  22*  ANIONGAP 13    Recent Labs  Lab 08/18/21 1953  PROT 6.8  ALBUMIN 2.9*  AST 51*  ALT 26  ALKPHOS 66  BILITOT 0.8   Lipids No results for input(s): CHOL, TRIG, HDL, LABVLDL, LDLCALC, CHOLHDL in the last 168 hours.  Hematology Recent Labs  Lab 08/13/21 1529 08/18/21 1953  WBC  --  20.2*  RBC  --  2.93*  HGB 10.0* 9.0*  HCT  --  28.5*  MCV  --  97.3  MCH  --  30.7  MCHC  --  31.6  RDW  --  14.9  PLT  --  172   Thyroid No results for input(s): TSH, FREET4 in the last 168 hours.  BNPNo results for input(s): BNP, PROBNP in the last 168 hours.  DDimer  Recent Labs  Lab 08/18/21 1953  DDIMER 1.64*     Radiology/Studies:  DG Chest Portable 1 View  Result Date: 08/18/2021 CLINICAL DATA:  Chest pain. EXAM: PORTABLE CHEST 1 VIEW COMPARISON:  08/29/2016. FINDINGS: The heart is enlarged. The mediastinal structures are stable. Atherosclerotic calcification of the aorta is noted. Patchy airspace disease is noted in the mid to lower lung field on the left and at the right lung base. There is no effusion or pneumothorax. No acute osseous abnormality. IMPRESSION: 1. Cardiomegaly. 2. Patchy airspace disease in the mid to lower lung field on the left at the right lung base, possible atelectasis or infiltrate. Electronically Signed   By: Brett Fairy M.D.   On: 08/18/2021 20:00     Assessment and Plan:   Alison [name] is a 87 y.o. female with a hx of CAD s/p remote PCI (1970s), HTN, HLD, HFmrEF (EF = 45-50%), CKD 3 and prior PE who is being seen 08/19/2021 for the evaluation of elevated troponins c/w NSTEMI.  #NSTEMI, Type 1 :: Patient presents with 48 hours of epigastric versus substernal chest and jaw pain with associated DOE in the setting of a markedly elevated troponin.  This constellation of symptoms is most concerning for ACS.  She reports having DOE since April and I wonder if this was an anginal equivalent for her.  With her troponin elevated to >8000, her known CAD,  and concerning presentation, I suspect that she likely has a type I NSTEMI from plaque rupture.  She does have this significantly elevated WBC and a possible infiltrate on CXR.  Although this is concerning for possible pneumonia, I would argue that clinically she does not appear to have pneumonia and thus this being demand ischemia in the setting of infection seems less likely in my opinion.  Ultimately I think that we should pursue catheterization to evaluate her coronary anatomy. -NPO at MN -Consider LHC in AM -Lipid panel, TSH, A1c -TTE -s/p ASA load -Continue ASA 81 mg daily -Continue heparin per pharmacy -Restart home metoprolol succinate 25 Mg daily -Hold home losartan pending LHC -Increase Crestor to 20 Mg daily from 10 Mg daily -Cardiac rehab at the time of discharge  #HTN #HLD -Statin and BP medications as described above  #HFmrEF -Continue home Lasix 20 mg twice daily  #Leukocytosis #Possible Pulmonary Infiltrate -Antibiotics and management per primary team    Risk Assessment/Risk Scores:     TIMI Risk  Score for Unstable Angina or Non-ST Elevation MI:   The patient's TIMI risk score is 6, which indicates a 41% risk of all cause mortality, new or recurrent myocardial infarction or need for urgent revascularization in the next 14 days.          For questions or updates, please contact [place] Please consult [email or web address] for contact info under    Signed, Hershal Coria, MD  08/19/2021 1:19 AM

## 2021-08-18 NOTE — ED Provider Notes (Signed)
[facility] EMERGENCY DEPARTMENT Provider Note   CSN: [id number] Arrival date & time:        History Chief Complaint  Patient presents with  . Chest Pain    Alison [name] is a 87 y.o. female.   Chest Pain Associated symptoms: cough   Associated symptoms: no fever    HPI: A 87 year old patient with a history of hypertension, hypercholesterolemia and obesity presents for evaluation of chest pain. Initial onset of pain was more than 6 hours ago. The patient's chest pain is described as heaviness/pressure/tightness and is not worse with exertion. The patient complains of nausea. The patient's chest pain is middle- or left-sided, is not well-localized, is not sharp and does radiate to the arms/jaw/neck. The patient denies diaphoresis. The patient has no history of stroke, has no history of peripheral artery disease, has not smoked in the past 90 days, denies any history of treated diabetes and has no relevant family history of coronary artery disease (first degree relative at less than age 69).  Patient states she started having symptoms on Sunday but today the pain was more intense and severe.  She felt that in the center and left side of her chest.  It went up into her neck.  It tends to increase when she is breathing.  She also felt some discomfort in her upper abdomen.  Patient was initially activated as a code STEMI.  She was seen by cardiology on arrival and code STEMI was deactivated Past Medical History:  Diagnosis Date  . Chronic anemia   . Chronic back pain   . Chronic kidney disease   . COPD (chronic obstructive pulmonary disease) ([place])   . Coronary atherosclerosis of native coronary artery    Occluded circ with collaterals and 90% RCA (previous stents at [facility], details not clear), LVEF 48% 1/11 - inferior and inferolateral scar by Myoview 1/11,   . Gout   . History of blood transfusion   . History of pneumonia   . Hyperlipidemia   . Hypertension   .  Myocardial infarction ([place]) 1997  . Nocturia   . Osteoarthritis   . 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 Chronic combined systolic and diastolic heart failure ([place]) 01/29/2020  . Essential hypertension 09/14/2016  . Stage 3 chronic kidney disease ([place]) 09/14/2016  . S/P lumbar spinal fusion 09/10/2016  . Hypocalcemia 09/03/2013  . Leg edema 04/06/2013  . History of pulmonary embolus (PE) 01/05/2011  . Anemia 06/26/2010  . Hyperlipidemia 06/24/2009  . CAD, NATIVE VESSEL 06/24/2009    Past Surgical History:  Procedure Laterality Date  . ABDOMINAL HYSTERECTOMY  1965   partial  . APPENDECTOMY    . BACK SURGERY    . cataract surgery Bilateral   . CORONARY ANGIOPLASTY  1997   3 stents  . cyst removed from back  2006  . LAMINECTOMY WITH POSTERIOR LATERAL ARTHRODESIS LEVEL 1 N/A 09/10/2016   Procedure: Posterior Lateral Fusion - Lumbar four-Lumbar five, lumbar laminectomy and pedicle screw fixation  - Lumbar four-Lumbar five;  Surgeon: Eustace Moore, MD;  Location: [facility];  Service: Neurosurgery;  Laterality: N/A;  . Left adrenalectomy  2009  . Right breast biopsy  2008  . Right knee replacement  2011     OB History   No obstetric history on file.  Family History  Problem Relation Age of Onset  . Stroke Father     Social History   Tobacco Use  . Smoking status: Former    Packs/day: 0.20    Types: Cigarettes    Quit date: 10/11/1977    Years since quitting: 43.8  . Smokeless tobacco: Never  Vaping Use  . Vaping Use: Never used  Substance Use Topics  . Alcohol use: No  . Drug use: No    Home Medications Prior to Admission medications   Medication Sig Start Date End Date Taking? Authorizing Provider  acetaminophen (TYLENOL) 650 MG CR tablet Take 650 mg by mouth 2 (two) times daily.     [provider]  ADVAIR DISKUS 250-50  MCG/DOSE AEPB Inhale 1 Inhaler into the lungs 2 (two) times daily. 09/10/12   [provider]  allopurinol (ZYLOPRIM) 100 MG tablet Take 100 mg by mouth daily.    [provider]  calcitRIOL (ROCALTROL) 0.25 MCG capsule Take 0.25 mcg by mouth daily.    [provider]  Calcium Carb-Cholecalciferol (CALCIUM 600+D3 PO) Take 1 tablet by mouth daily.    [provider]  clopidogrel (PLAVIX) 75 MG tablet TAKE 1 TABLET BY MOUTH  DAILY 12/01/20   Satira Sark, MD  epoetin alfa (EPOGEN) 3000 UNIT/ML injection Inject 3,000 Units into the vein every 14 (fourteen) days. Patient not taking: Reported on 06/25/2021    Liana Gerold, MD  epoetin alfa-epbx (RETACRIT) 3000 UNIT/ML injection Inject 3,000 Units into the skin every 14 (fourteen) days. Patient not taking: Reported on 06/25/2021 10/17/20   Liana Gerold, MD  epoetin alfa-epbx (RETACRIT) 3000 UNIT/ML injection Inject 3,000 Units into the skin every 14 (fourteen) days.    Bhutani, Manpreet S, MD  ferrous sulfate 325 (65 FE) MG tablet Take 325 mg by mouth daily.     [provider]  furosemide (LASIX) 20 MG tablet TAKE 1 TABLET BY MOUTH  TWICE DAILY MAY TAKE 1  EXTRA TABLET DAILY AS  NEEDED FOR SWELLING OR  SHORTNESS OF BREATH 09/15/20   Verta Ellen., NP  isosorbide mononitrate (IMDUR) 30 MG 24 hr tablet TAKE 1 TABLET BY MOUTH  DAILY 12/01/20   Satira Sark, MD  losartan (COZAAR) 50 MG tablet Take 1 tablet (50 mg total) by mouth daily. 01/29/20   Verta Ellen., NP  losartan (COZAAR) 50 MG tablet Take 25 mg by mouth daily.    [provider]  metoprolol succinate (TOPROL-XL) 25 MG 24 hr tablet TAKE 1 TABLET BY MOUTH  DAILY 12/01/20   Satira Sark, MD  nitroGLYCERIN (NITROSTAT) 0.4 MG SL tablet Place 1 tablet (0.4 mg total) under the tongue every 5 (five) minutes as needed for chest pain. 12/04/20   Satira Sark, MD  potassium chloride (KLOR-CON) 10 MEQ tablet TAKE 1  TABLET BY MOUTH  DAILY 12/01/20   Satira Sark, MD  rosuvastatin (CRESTOR) 10 MG tablet TAKE 1 TABLET BY MOUTH  DAILY 12/18/20   Satira Sark, MD    Allergies    Patient has no known allergies.  Review of Systems   Review of Systems  Constitutional:  Negative for fever.  Respiratory:  Positive for cough.   Cardiovascular:  Positive for chest pain.  All other systems reviewed and are negative.  Physical Exam Updated Vital Signs BP 113/68   Pulse 99   Temp 98.3 F (36.8 C) (Oral)   Resp 18   Ht 1.549  m (5\' 1" )   Wt 99.8 kg   SpO2 100%   BMI 41.57 kg/m   Physical Exam Vitals and nursing note reviewed.  Constitutional:      Appearance: She is well-developed. She is not diaphoretic.  HENT:     Head: Normocephalic and atraumatic.     Right Ear: External ear normal.     Left Ear: External ear normal.  Eyes:     General: No scleral icterus.       Right eye: No discharge.        Left eye: No discharge.     Conjunctiva/sclera: Conjunctivae normal.  Neck:     Trachea: No tracheal deviation.  Cardiovascular:     Rate and Rhythm: Normal rate and regular rhythm.  Pulmonary:     Effort: Pulmonary effort is normal. No respiratory distress.     Breath sounds: Normal breath sounds. No stridor. No wheezing or rales.  Abdominal:     General: Bowel sounds are normal. There is no distension.     Palpations: Abdomen is soft.     Tenderness: There is no abdominal tenderness. There is no guarding or rebound.  Musculoskeletal:        General: No tenderness or deformity.     Cervical back: Neck supple.     Comments: Trace edema bilateral lower extremities  Skin:    General: Skin is warm and dry.     Findings: No rash.  Neurological:     General: No focal deficit present.     Mental Status: She is alert.     Cranial Nerves: No cranial nerve deficit (no facial droop, extraocular movements intact, no slurred speech).     Sensory: No sensory deficit.     Motor: No abnormal  muscle tone or seizure activity.     Coordination: Coordination normal.  Psychiatric:        Mood and Affect: Mood normal.    ED Results / Procedures / Treatments   Labs (all labs ordered are listed, but only abnormal results are displayed) Labs Reviewed  CBC - Abnormal; Notable for the following components:      Result Value   WBC 20.2 (*)    RBC 2.93 (*)    Hemoglobin 9.0 (*)    HCT 28.5 (*)    All other components within normal limits  D-DIMER, QUANTITATIVE - Abnormal; Notable for the following components:   D-Dimer, Quant 1.64 (*)    All other components within normal limits  COMPREHENSIVE METABOLIC PANEL - Abnormal; Notable for the following components:   CO2 19 (*)    Glucose, Bld 124 (*)    BUN 43 (*)    Creatinine, Ser 2.12 (*)    Albumin 2.9 (*)    AST 51 (*)    GFR, Estimated 22 (*)    All other components within normal limits  CBG MONITORING, ED - Abnormal; Notable for the following components:   Glucose-Capillary 119 (*)    All other components within normal limits  TROPONIN I (HIGH SENSITIVITY) - Abnormal; Notable for the following components:   Troponin I (High Sensitivity) 8,194 (*)    All other components within normal limits  RESP PANEL BY RT-PCR (FLU A&B, COVID) ARPGX2  LIPASE, BLOOD  HEPARIN LEVEL (UNFRACTIONATED)  TROPONIN I (HIGH SENSITIVITY)    EKG EKG Interpretation  Date/Time:  Tuesday August 18 2021 19:14:27 EST Ventricular Rate:  104 PR Interval:  182 QRS Duration: 107 QT Interval:  351 QTC Calculation: 462  R Axis:   -44 Text Interpretation: Sinus tachycardia LAD, consider left anterior fascicular block Anterior infarct, old ST elevation suggests acute pericarditis , anterior st changes increased since prior Confirmed by Dorie Rank [phone]) on 08/18/2021 7:17:44 PM  Radiology DG Chest Portable 1 View  Result Date: 08/18/2021 CLINICAL DATA:  Chest pain. EXAM: PORTABLE CHEST 1 VIEW COMPARISON:  08/29/2016. FINDINGS: The heart is  enlarged. The mediastinal structures are stable. Atherosclerotic calcification of the aorta is noted. Patchy airspace disease is noted in the mid to lower lung field on the left and at the right lung base. There is no effusion or pneumothorax. No acute osseous abnormality. IMPRESSION: 1. Cardiomegaly. 2. Patchy airspace disease in the mid to lower lung field on the left at the right lung base, possible atelectasis or infiltrate. Electronically Signed   By: Brett Fairy M.D.   On: 08/18/2021 20:00    Procedures .Critical Care Performed by: Dorie Rank, MD Authorized by: Dorie Rank, MD   Critical care provider statement:    Critical care time (minutes):  40   Critical care was time spent personally by me on the following activities:  Discussions with consultants, evaluation of patient's response to treatment, examination of patient, ordering and review of laboratory studies, ordering and review of radiographic studies, pulse oximetry, re-evaluation of patient's condition, review of old charts and obtaining history from patient or surrogate   Medications Ordered in ED Medications  cefTRIAXone (ROCEPHIN) 1 g in sodium chloride 0.9 % 100 mL IVPB (1 g Intravenous New Bag/Given 08/18/21 2127)  heparin ADULT infusion 100 units/mL (25000 units/294mL) (has no administration in time range)  heparin bolus via infusion 4,000 Units (has no administration in time range)  aspirin chewable tablet 324 mg (324 mg Oral Given 08/18/21 1946)  nitroGLYCERIN (NITROGLYN) 2 % ointment 1 inch (1 inch Topical Given 08/18/21 1944)  azithromycin (ZITHROMAX) tablet 500 mg (500 mg Oral Given 08/18/21 2128)    ED Course  I have reviewed the triage vital signs and the nursing notes.  Pertinent labs & imaging results that were available during my care of the patient were reviewed by me and considered in my medical decision making (see chart for details).  Clinical Course as of 08/18/21 2247  Tue Aug 18, 2021  2053 White count  elevated at 20.  Hemoglobin stable at 9 [JK]  2053 D-dimer elevated, previous values also noted to be elevated [JK]  2053 Chest x-ray suggest possible infiltrate in the right lung [JK]  2134 Troponin significantly elevated at 8194.  Concerning for non-STEMI [JK]  2200 Case discussed with cardiology, Dr Wilnette Kales. [JK]    Clinical Course User Index [JK] Dorie Rank, MD   MDM Rules/Calculators/A&P HEAR Score: 7                         Patient presented to the ED for evaluation of chest pain as well as recent cough.  Initially activated as a STEMI.  Evaluated by cardiology on arrival.  Presentation not felt to be consistent with ST elevation MI.  Patient's ED work-up was notable for an elevated white blood cell count as well as chest x-ray suggesting possible pneumonia.  Patient also has elevated D-dimer although previous values have shown elevations in the past.  Patient however does have history of known PE.  Patient's troponin is significantly elevated suggesting myocardial ischemia.  Patient's creatinine elevated so will avoid CT angio.  I will order a VQ scan.  I have ordered antibiotics as well as heparin.  I have consulted with cardiology who will come see the patient in the ED.  Patient will require admission to the hospital for further treatment Final Clinical Impression(s) / ED Diagnoses Final diagnoses:  NSTEMI (non-ST elevated myocardial infarction) [facility])  Community acquired pneumonia, unspecified laterality  Chronic kidney disease, unspecified CKD stage     Dorie Rank, MD 08/18/21 2137

## 2021-08-18 NOTE — Treatment Plan (Signed)
STEMI activation note:  [facility] activated STEMI based on EKG.  EKG demonstrates left anterior fascicular block unchanged versus previous EKG.  On arrival here the patient has pleuritic chest pain which has been going on for months.  Denies any severe chest pain, nausea, or diaphoresis.  We will defer emergency coronary angiography at this time.  Discussed with the ED provider who agrees.

## 2021-08-18 NOTE — ED Notes (Signed)
Cardiology, Dr Hershal Coria, at bedside.

## 2021-08-18 NOTE — Progress Notes (Signed)
ANTICOAGULATION CONSULT NOTE - Initial Consult  Pharmacy Consult for IV heparin Indication: chest pain/ACS  No Known Allergies  Patient Measurements:   Heparin Dosing Weight: 71.8 kg  Vital Signs: Temp: 98.3 F (36.8 C) (11/08 1907) Temp Source: Oral (11/08 1907) BP: 113/68 (11/08 1930) Pulse Rate: 99 (11/08 1930)  Labs: Recent Labs    08/18/21 1953  HGB 9.0*  HCT 28.5*  PLT 172  CREATININE 2.12*  TROPONINIHS 8,194*    CrCl cannot be calculated (Unknown ideal weight.).   Medical History: Past Medical History:  Diagnosis Date   Chronic anemia    Chronic back pain    Chronic kidney disease    COPD (chronic obstructive pulmonary disease) (HCC)    Coronary atherosclerosis of native coronary artery    Occluded circ with collaterals and 90% RCA (previous stents at [facility], details not clear), LVEF 48% 1/11 - inferior and inferolateral scar by Myoview 1/11,    Gout    History of blood transfusion    History of pneumonia    Hyperlipidemia    Hypertension    Myocardial infarction ([place]) 1997   Nocturia    Osteoarthritis    Pulmonary embolism (HCC)    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 EKG unchanged with from previous. Previously on coumadin for DVT and PE, but no longer on anticoagulation. Pharmacy consulted to start IV heparin for ACS.    Goal of Therapy:  Heparin level 0.3-0.7 units/ml Monitor platelets by anticoagulation protocol: Yes   Plan:  Heparin IV bolus 4000 units  Start heparin IV infusion 950 units/hr 8 hour heparin level  Monitor CBC and Heparin level daily   Thank you for allowing pharmacy to participate in this patient's care.  Levonne Spiller, PharmD PGY1 Acute Care Resident  08/18/2021,9:41 PM

## 2021-08-18 NOTE — ED Triage Notes (Signed)
Arrives to ED via [facility]. Pt reports CP and diaphoretic took nitro 3x @ 1500. 175ml NS and 1 NTG given by EMS. Upon arrival to ED, CP only with respirations. ASA not given. HX CHF.

## 2021-08-19 ENCOUNTER — Other Ambulatory Visit: Payer: Self-pay

## 2021-08-19 ENCOUNTER — Inpatient Hospital Stay (HOSPITAL_COMMUNITY): Payer: Medicare Other

## 2021-08-19 ENCOUNTER — Encounter (HOSPITAL_COMMUNITY): Payer: Self-pay | Admitting: Family Medicine

## 2021-08-19 DIAGNOSIS — R7989 Other specified abnormal findings of blood chemistry: Secondary | ICD-10-CM | POA: Diagnosis present

## 2021-08-19 DIAGNOSIS — I214 Non-ST elevation (NSTEMI) myocardial infarction: Secondary | ICD-10-CM | POA: Diagnosis not present

## 2021-08-19 DIAGNOSIS — J449 Chronic obstructive pulmonary disease, unspecified: Secondary | ICD-10-CM | POA: Diagnosis present

## 2021-08-19 DIAGNOSIS — J189 Pneumonia, unspecified organism: Secondary | ICD-10-CM

## 2021-08-19 DIAGNOSIS — D72829 Elevated white blood cell count, unspecified: Secondary | ICD-10-CM | POA: Diagnosis present

## 2021-08-19 LAB — ECHOCARDIOGRAM COMPLETE
AR max vel: 2.69 cm2
AV Peak grad: 4.3 mmHg
Ao pk vel: 1.04 m/s
Area-P 1/2: 3.77 cm2
Calc EF: 37.4 %
Height: 61 in
S' Lateral: 3.3 cm
Single Plane A2C EF: 36.1 %
Single Plane A4C EF: 38 %
Weight: 3520 oz

## 2021-08-19 LAB — CBC WITH DIFFERENTIAL/PLATELET
Abs Immature Granulocytes: 0.18 10*3/uL — ABNORMAL HIGH (ref 0.00–0.07)
Basophils Absolute: 0 10*3/uL (ref 0.0–0.1)
Basophils Relative: 0 %
Eosinophils Absolute: 0 10*3/uL (ref 0.0–0.5)
Eosinophils Relative: 0 %
HCT: 27.9 % — ABNORMAL LOW (ref 36.0–46.0)
Hemoglobin: 8.7 g/dL — ABNORMAL LOW (ref 12.0–15.0)
Immature Granulocytes: 1 %
Lymphocytes Relative: 11 %
Lymphs Abs: 1.9 10*3/uL (ref 0.7–4.0)
MCH: 30.5 pg (ref 26.0–34.0)
MCHC: 31.2 g/dL (ref 30.0–36.0)
MCV: 97.9 fL (ref 80.0–100.0)
Monocytes Absolute: 1.6 10*3/uL — ABNORMAL HIGH (ref 0.1–1.0)
Monocytes Relative: 9 %
Neutro Abs: 13.4 10*3/uL — ABNORMAL HIGH (ref 1.7–7.7)
Neutrophils Relative %: 79 %
Platelets: 139 10*3/uL — ABNORMAL LOW (ref 150–400)
RBC: 2.85 MIL/uL — ABNORMAL LOW (ref 3.87–5.11)
RDW: 14.9 % (ref 11.5–15.5)
WBC: 17.1 10*3/uL — ABNORMAL HIGH (ref 4.0–10.5)
nRBC: 0 % (ref 0.0–0.2)

## 2021-08-19 LAB — LIPID PANEL
Cholesterol: 84 mg/dL (ref 0–200)
HDL: 51 mg/dL (ref >40)
LDL Cholesterol: 22 mg/dL (ref 0–99)
Total CHOL/HDL Ratio: 1.6 RATIO
Triglycerides: 53 mg/dL (ref <150)
VLDL: 11 mg/dL (ref 0–40)

## 2021-08-19 LAB — TSH: TSH: 3.553 u[IU]/mL (ref 0.350–4.500)

## 2021-08-19 LAB — TYPE AND SCREEN
ABO/RH(D): O POS
Antibody Screen: NEGATIVE

## 2021-08-19 LAB — BASIC METABOLIC PANEL
Anion gap: 12 (ref 5–15)
BUN: 45 mg/dL — ABNORMAL HIGH (ref 8–23)
CO2: 20 mmol/L — ABNORMAL LOW (ref 22–32)
Calcium: 9 mg/dL (ref 8.9–10.3)
Chloride: 104 mmol/L (ref 98–111)
Creatinine, Ser: 2.33 mg/dL — ABNORMAL HIGH (ref 0.44–1.00)
GFR, Estimated: 20 mL/min — ABNORMAL LOW (ref >60)
Glucose, Bld: 119 mg/dL — ABNORMAL HIGH (ref 70–99)
Potassium: 4.1 mmol/L (ref 3.5–5.1)
Sodium: 136 mmol/L (ref 135–145)

## 2021-08-19 LAB — HEPARIN LEVEL (UNFRACTIONATED)
Heparin Unfractionated: 0.24 IU/mL — ABNORMAL LOW (ref 0.30–0.70)
Heparin Unfractionated: 0.16 IU/mL — ABNORMAL LOW (ref 0.30–0.70)
Heparin Unfractionated: 0.28 IU/mL — ABNORMAL LOW (ref 0.30–0.70)

## 2021-08-19 LAB — HEMOGLOBIN A1C
Hgb A1c MFr Bld: 6 % — ABNORMAL HIGH (ref 4.8–5.6)
Mean Plasma Glucose: 125.5 mg/dL

## 2021-08-19 LAB — PROTIME-INR
INR: 1.3 — ABNORMAL HIGH (ref 0.8–1.2)
Prothrombin Time: 16.3 seconds — ABNORMAL HIGH (ref 11.4–15.2)

## 2021-08-19 LAB — APTT: aPTT: 54 seconds — ABNORMAL HIGH (ref 24–36)

## 2021-08-19 LAB — TROPONIN I (HIGH SENSITIVITY): Troponin I (High Sensitivity): 8870 ng/L (ref <18)

## 2021-08-19 LAB — PROCALCITONIN: Procalcitonin: 2.2 ng/mL

## 2021-08-19 MED ORDER — ISOSORBIDE MONONITRATE ER 30 MG PO TB24
15.0000 mg | ORAL_TABLET | Freq: Every day | ORAL | Status: DC
  Administered 2021-08-19 – 2021-08-21 (×3): 15 mg via ORAL
  Filled 2021-08-19 (×3): qty 1

## 2021-08-19 MED ORDER — CLOPIDOGREL BISULFATE 75 MG PO TABS
300.0000 mg | ORAL_TABLET | Freq: Once | ORAL | Status: AC
  Administered 2021-08-19: 300 mg via ORAL
  Filled 2021-08-19: qty 4

## 2021-08-19 MED ORDER — METOPROLOL SUCCINATE ER 25 MG PO TB24
25.0000 mg | ORAL_TABLET | Freq: Every day | ORAL | Status: DC
  Administered 2021-08-19 – 2021-08-22 (×4): 25 mg via ORAL
  Filled 2021-08-19 (×4): qty 1

## 2021-08-19 MED ORDER — FUROSEMIDE 20 MG PO TABS
20.0000 mg | ORAL_TABLET | Freq: Two times a day (BID) | ORAL | Status: DC
  Administered 2021-08-19 (×2): 20 mg via ORAL
  Filled 2021-08-19 (×2): qty 1

## 2021-08-19 MED ORDER — TECHNETIUM TO 99M ALBUMIN AGGREGATED
4.2000 | Freq: Once | INTRAVENOUS | Status: AC | PRN
  Administered 2021-08-19: 4.2 via INTRAVENOUS

## 2021-08-19 MED ORDER — SODIUM CHLORIDE 0.9 % IV SOLN
500.0000 mg | INTRAVENOUS | Status: DC
  Administered 2021-08-19 – 2021-08-20 (×2): 500 mg via INTRAVENOUS
  Filled 2021-08-19 (×2): qty 500

## 2021-08-19 MED ORDER — CLOPIDOGREL BISULFATE 75 MG PO TABS
75.0000 mg | ORAL_TABLET | Freq: Every day | ORAL | Status: DC
  Administered 2021-08-20 – 2021-08-25 (×6): 75 mg via ORAL
  Filled 2021-08-19 (×6): qty 1

## 2021-08-19 MED ORDER — MOMETASONE FURO-FORMOTEROL FUM 200-5 MCG/ACT IN AERO
2.0000 | INHALATION_SPRAY | Freq: Two times a day (BID) | RESPIRATORY_TRACT | Status: DC
  Administered 2021-08-19 – 2021-08-25 (×11): 2 via RESPIRATORY_TRACT
  Filled 2021-08-19: qty 8.8

## 2021-08-19 MED ORDER — SODIUM CHLORIDE 0.9 % IV SOLN
2.0000 g | INTRAVENOUS | Status: DC
  Administered 2021-08-19 – 2021-08-20 (×2): 2 g via INTRAVENOUS
  Filled 2021-08-19 (×2): qty 20

## 2021-08-19 NOTE — Progress Notes (Signed)
Mobility Specialist: Progress Note   08/19/21 1541  Mobility  Activity Ambulated in hall  Level of Assistance Contact guard assist, steadying assist  Assistive Device Front wheel walker  Distance Ambulated (ft) 60 ft  Mobility Ambulated with assistance in hallway  Mobility Response Tolerated well  Mobility performed by Mobility specialist  $Mobility charge 1 Mobility   Pre-Mobility: 87 HR, 100% SpO2 Post-Mobility: 105 HR, 100% SpO2  Pt able to sit EOB independently as well as stand. Pt states she normally uses a cane at home but felt the RW would be more beneficial this session. Pt c/o feeling SOB, otherwise no c/o. Pt is sitting EOB with family present in the room and call bell at her side.   [facility] Alison [name] Mobility Specialist Mobility Specialist Phone: [phone]

## 2021-08-19 NOTE — H&P (Signed)
History and Physical    Alison [name] GUR:[id number] DOB: 1930/12/29 DOA: 08/18/2021  PCP: [name], MD   Patient coming from: Home   Chief Complaint: Chest pain, SOB   HPI: Alison [name] is a pleasant 87 y.o. female with medical history significant for coronary artery disease with remote stents, COPD, CKD stage IV, hypertension, and history of current DVT and PE with IVC filter previously on Coumadin, now presenting to the emergency department with 2 days of pain in her mid and lower chest radiating to her back and associated with shortness of breath and diaphoresis.  She describes a dull ache that is worse with deep breath and severe at times.  Symptoms improved after initial onset 2 days ago but returned this evening, more severe than before, and associated with diaphoresis and one episode of vomiting.  She has had more fatigue and shortness of breath for the past couple days but no change in her mild chronic cough which is mostly at night.  She reports intermittent leg swelling that is longstanding and unchanged.  She denies hemoptysis.  She denies fevers or chills.  She called EMS, there was concern for STEMI, and the patient was brought into the ED.  She took 3 nitroglycerin this evening and was given 1 nitroglycerin and 100 cc of IV fluid with EMS.  ED Course: Upon arrival to the ED, patient is found to be afebrile, saturating well on room air, slightly tachycardic, and with stable blood pressure.  EKG features sinus tachycardia with rate 104 and LAFB.  Chest x-ray with cardiomegaly and patchy airspace disease in the bilateral lower lungs reflecting atelectasis or infiltrate.  Chemistry panel notable for BUN 43, creatinine 2.12, bicarbonate 19, and albumin of 2.9.  CBC with leukocytosis to 20,200 and stable normocytic anemia with hemoglobin 9.0.  D-dimer is 1.64.  Troponin 8194.  Patient was given 324 mg of aspirin, nitroglycerin ointment, started on IV heparin, and treated with  Rocephin and azithromycin in the ED.  Cardiology was consulted and medical admission recommended.  Review of Systems:  All other systems reviewed and apart from HPI, are negative.  Past Medical History:  Diagnosis Date   Chronic anemia    Chronic back pain    Chronic kidney disease    COPD (chronic obstructive pulmonary disease) (HCC)    Coronary atherosclerosis of native coronary artery    Occluded circ with collaterals and 90% RCA (previous stents at [facility], details not clear), LVEF 48% 1/11 - inferior and inferolateral scar by Myoview 1/11,    Gout    History of blood transfusion    History of pneumonia    Hyperlipidemia    Hypertension    Myocardial infarction ([place]) 1997   Nocturia    Osteoarthritis    Pulmonary embolism ([place])    History of recurrent DVT and PE status post IVC filter, previously on Coumadin    Past Surgical History:  Procedure Laterality Date   ABDOMINAL HYSTERECTOMY  1965   partial   APPENDECTOMY     BACK SURGERY     cataract surgery Bilateral    CORONARY ANGIOPLASTY  1997   3 stents   cyst removed from back  2006   [place] 1 N/A 09/10/2016   Procedure: Posterior Lateral Fusion - Lumbar four-Lumbar five, lumbar laminectomy and pedicle screw fixation  - Lumbar four-Lumbar five;  Surgeon: Eustace Moore, MD;  Location: [place];  Service: Neurosurgery;  Laterality: N/A;  Left adrenalectomy  2009   Right breast biopsy  2008   Right knee replacement  2011    Social History:   reports that she quit smoking about 43 years ago. Her smoking use included cigarettes. She smoked an average of .2 packs per day. She has never used smokeless tobacco. She reports that she does not drink alcohol and does not use drugs.  No Known Allergies  Family History  Problem Relation Age of Onset   Stroke Father      Prior to Admission medications   Medication Sig Start Date End Date Taking? Authorizing Provider  acetaminophen  (TYLENOL) 650 MG CR tablet Take 650 mg by mouth 2 (two) times daily.     [provider]  ADVAIR DISKUS 250-50 MCG/DOSE AEPB Inhale 1 Inhaler into the lungs 2 (two) times daily. 09/10/12   [provider]  allopurinol (ZYLOPRIM) 100 MG tablet Take 100 mg by mouth daily.    [provider]  calcitRIOL (ROCALTROL) 0.25 MCG capsule Take 0.25 mcg by mouth daily.    [provider]  Calcium Carb-Cholecalciferol (CALCIUM 600+D3 PO) Take 1 tablet by mouth daily.    [provider]  clopidogrel (PLAVIX) 75 MG tablet TAKE 1 TABLET BY MOUTH  DAILY 12/01/20   Satira Sark, MD  epoetin alfa (EPOGEN) 3000 UNIT/ML injection Inject 3,000 Units into the vein every 14 (fourteen) days. Patient not taking: Reported on 06/25/2021    Liana Gerold, MD  epoetin alfa-epbx (RETACRIT) 3000 UNIT/ML injection Inject 3,000 Units into the skin every 14 (fourteen) days. Patient not taking: Reported on 06/25/2021 10/17/20   Liana Gerold, MD  epoetin alfa-epbx (RETACRIT) 3000 UNIT/ML injection Inject 3,000 Units into the skin every 14 (fourteen) days.    Bhutani, Manpreet S, MD  ferrous sulfate 325 (65 FE) MG tablet Take 325 mg by mouth daily.     [provider]  furosemide (LASIX) 20 MG tablet TAKE 1 TABLET BY MOUTH  TWICE DAILY MAY TAKE 1  EXTRA TABLET DAILY AS  NEEDED FOR SWELLING OR  SHORTNESS OF BREATH 09/15/20   Verta Ellen., NP  isosorbide mononitrate (IMDUR) 30 MG 24 hr tablet TAKE 1 TABLET BY MOUTH  DAILY 12/01/20   Satira Sark, MD  losartan (COZAAR) 50 MG tablet Take 1 tablet (50 mg total) by mouth daily. 01/29/20   Verta Ellen., NP  losartan (COZAAR) 50 MG tablet Take 25 mg by mouth daily.    [provider]  metoprolol succinate (TOPROL-XL) 25 MG 24 hr tablet TAKE 1 TABLET BY MOUTH  DAILY 12/01/20   Satira Sark, MD  nitroGLYCERIN (NITROSTAT) 0.4 MG SL tablet Place 1 tablet (0.4 mg total) under the tongue every 5 (five)  minutes as needed for chest pain. 12/04/20   Satira Sark, MD  potassium chloride (KLOR-CON) 10 MEQ tablet TAKE 1 TABLET BY MOUTH  DAILY 12/01/20   Satira Sark, MD  rosuvastatin (CRESTOR) 10 MG tablet TAKE 1 TABLET BY MOUTH  DAILY 12/18/20   Satira Sark, MD    Physical Exam: Vitals:   08/19/21 0115 08/19/21 0343 08/19/21 0345 08/19/21 0346  BP: 98/61 (!) 90/56 (!) 86/57 (!) 92/59  Pulse: 91 87 86 88  Resp: 20 20 18 20   Temp:   98.3 F (36.8 C)   TempSrc:   Oral   SpO2: 99% 100% 100% 100%  Weight:      Height:  Constitutional: NAD, calm  Eyes: PERTLA, lids and conjunctivae normal ENMT: Mucous membranes are moist. Posterior pharynx clear of any exudate or lesions.   Neck: supple, no masses  Respiratory:  no wheezing, no rales. No accessory muscle use.  Cardiovascular: S1 & S2 heard, regular rate and rhythm. Mild lower leg edema b/l.  Abdomen: No distension, no tenderness, soft. Bowel sounds active.  Musculoskeletal: no clubbing / cyanosis. No joint deformity upper and lower extremities.   Skin: no significant rashes, lesions, ulcers. Warm, dry, well-perfused. Neurologic: Gross hearing deficit, CN 2-12 grossly intact otherwise. Moving all extremities. Alert and oriented.  Psychiatric: Very pleasant. Cooperative.    Labs and Imaging on Admission: I have personally reviewed following labs and imaging studies  CBC: Recent Labs  Lab 08/13/21 1529 08/18/21 1953 08/19/21 0322  WBC  --  20.2* 17.1*  NEUTROABS  --   --  13.4*  HGB 10.0* 9.0* 8.7*  HCT  --  28.5* 27.9*  MCV  --  97.3 97.9  PLT  --  172 627*   Basic Metabolic Panel: Recent Labs  Lab 08/18/21 1953  NA 135  K 4.3  CL 103  CO2 19*  GLUCOSE 124*  BUN 43*  CREATININE 2.12*  CALCIUM 9.0   GFR: Estimated Creatinine Clearance: 19.9 mL/min (A) (by C-G formula based on SCr of 2.12 mg/dL (H)). Liver Function Tests: Recent Labs  Lab 08/18/21 1953  AST 51*  ALT 26  ALKPHOS 66  BILITOT  0.8  PROT 6.8  ALBUMIN 2.9*   Recent Labs  Lab 08/18/21 1953  LIPASE 30   No results for input(s): AMMONIA in the last 168 hours. Coagulation Profile: Recent Labs  Lab 08/19/21 0211  INR 1.3*   Cardiac Enzymes: No results for input(s): CKTOTAL, CKMB, CKMBINDEX, TROPONINI in the last 168 hours. BNP (last 3 results) No results for input(s): PROBNP in the last 8760 hours. HbA1C: Recent Labs    08/19/21 0322  HGBA1C 6.0*   CBG: Recent Labs  Lab 08/18/21 1920  GLUCAP 119*   Lipid Profile: No results for input(s): CHOL, HDL, LDLCALC, TRIG, CHOLHDL, LDLDIRECT in the last 72 hours. Thyroid Function Tests: Recent Labs    08/19/21 0231  TSH 3.553   Anemia Panel: No results for input(s): VITAMINB12, FOLATE, FERRITIN, TIBC, IRON, RETICCTPCT in the last 72 hours. Urine analysis:    Component Value Date/Time   COLORURINE YELLOW 03/03/2010 [place] 03/03/2010 0955   LABSPEC 1.017 03/03/2010 0955   PHURINE 5.5 03/03/2010 0955   GLUCOSEU NEGATIVE 03/03/2010 0955   HGBUR NEGATIVE 03/03/2010 0955   BILIRUBINUR NEGATIVE 03/03/2010 0955   KETONESUR NEGATIVE 03/03/2010 0955   PROTEINUR NEGATIVE 03/03/2010 0955   UROBILINOGEN 0.2 03/03/2010 0955   NITRITE NEGATIVE 03/03/2010 0955   LEUKOCYTESUR  03/03/2010 0955    NEGATIVE MICROSCOPIC NOT DONE ON URINES WITH NEGATIVE PROTEIN, BLOOD, LEUKOCYTES, NITRITE, OR GLUCOSE <1000 mg/dL.   Sepsis Labs: @LABRCNTIP (procalcitonin:4,lacticidven:4) ) Recent Results (from the past 240 hour(s))  Resp Panel by RT-PCR (Flu A&B, Covid) Nasopharyngeal Swab     Status: None   Collection Time: 08/18/21  7:16 PM   Specimen: Nasopharyngeal Swab; Nasopharyngeal(NP) swabs in vial transport medium  Result Value Ref Range Status   SARS Coronavirus 2 by RT PCR NEGATIVE NEGATIVE Final    Comment: (NOTE) SARS-CoV-2 target nucleic acids are NOT DETECTED.  The SARS-CoV-2 RNA is generally detectable in upper respiratory specimens during  the acute phase of infection. The lowest concentration of SARS-CoV-2  viral copies this assay can detect is 138 copies/mL. A negative result does not preclude SARS-Cov-2 infection and should not be used as the sole basis for treatment or other patient management decisions. A negative result may occur with  improper specimen collection/handling, submission of specimen other than nasopharyngeal swab, presence of viral mutation(s) within the areas targeted by this assay, and inadequate number of viral copies(<138 copies/mL). A negative result must be combined with clinical observations, patient history, and epidemiological information. The expected result is Negative.  Fact Sheet for Patients:  EntrepreneurPulse.com.au  Fact Sheet for Healthcare Providers:  IncredibleEmployment.be  This test is no t yet approved or cleared by the [place] FDA and  has been authorized for detection and/or diagnosis of SARS-CoV-2 by FDA under an Emergency Use Authorization (EUA). This EUA will remain  in effect (meaning this test can be used) for the duration of the COVID-19 declaration under Section 564(b)(1) of the Act, 21 U.S.C.section 360bbb-3(b)(1), unless the authorization is terminated  or revoked sooner.       Influenza A by PCR NEGATIVE NEGATIVE Final   Influenza B by PCR NEGATIVE NEGATIVE Final    Comment: (NOTE) The Xpert Xpress SARS-CoV-2/FLU/RSV plus assay is intended as an aid in the diagnosis of influenza from Nasopharyngeal swab specimens and should not be used as a sole basis for treatment. Nasal washings and aspirates are unacceptable for Xpert Xpress SARS-CoV-2/FLU/RSV testing.  Fact Sheet for Patients: EntrepreneurPulse.com.au  Fact Sheet for Healthcare Providers: IncredibleEmployment.be  This test is not yet approved or cleared by the [place] FDA and has been authorized for detection and/or  diagnosis of SARS-CoV-2 by FDA under an Emergency Use Authorization (EUA). This EUA will remain in effect (meaning this test can be used) for the duration of the COVID-19 declaration under Section 564(b)(1) of the Act, 21 U.S.C. section 360bbb-3(b)(1), unless the authorization is terminated or revoked.  Performed at [facility], [place] [address] [place], [place] [address]      Radiological Exams on Admission: DG Chest Portable 1 View  Result Date: 08/18/2021 CLINICAL DATA:  Chest pain. EXAM: PORTABLE CHEST 1 VIEW COMPARISON:  08/29/2016. FINDINGS: The heart is enlarged. The mediastinal structures are stable. Atherosclerotic calcification of the aorta is noted. Patchy airspace disease is noted in the mid to lower lung field on the left and at the right lung base. There is no effusion or pneumothorax. No acute osseous abnormality. IMPRESSION: 1. Cardiomegaly. 2. Patchy airspace disease in the mid to lower lung field on the left at the right lung base, possible atelectasis or infiltrate. Electronically Signed   By: Brett Fairy M.D.   On: 08/18/2021 20:00    EKG: Independently reviewed. Sinus tachycardia (rate 104), LAFB.   Assessment/Plan   1. NSTEMI  - Presents with 2 days of chest pain, SOB, and fatigue and found to have troponin 8194 concerning for MI  - She came in as code STEMI initially, evaluated by cardiology on arrival, EKG felt to be unchanged, and code STEMI cancelled  - Appreciate cardiology consultant, considering LHC in am and recommending NPO, continue IV heparin, continue ASA 81, Crestor, and Toprol   2. Leukocytosis; ?PNA  - WBC is 20,200 in ED and there was question of atelectasis vs PNA in lower lungs b/l  - She has been more dyspneic recently in setting of MI but chronic cough unchanged and she is not tachypneic or hypoxic  - She was given Rocephin and azithromycin in ED  -  Leukocytosis could be reactive to #1 - Check procalcitonin, continue antibiotics for now     3. Elevated d-dimer; hx of recurrent DVT and PE  - She has hx of recurrent DVT and PE with IVC filter, previously on coumadin  - D-dimer is 1.64 in ED and V/Q scan was ordered  - Follow-up V/Q results    4. CKD IV  - SCr is 2.12 on admission; was 1.66 in October 2022 but that appears to be outlier  - Renally-dose medications, hold losartan initially per cardiology    5. Chronic systolic and diastolic CHF  - Appears compensated  - Had mid-range EF and grade 1 diastolic dysfunction on TTE in April 2021  - Continue Lasix, monitor volume status    6. COPD  - No wheezing on admission  - Continue ICS/LABA    DVT prophylaxis: IV heparin  Code Status: DNR, confirmed on admission  Level of Care: Level of care: Telemetry Cardiac Family Communication: Daughter updated at bedside  Disposition Plan:  Patient is from: home  Anticipated d/c is to: TBD Anticipated d/c date is: 08/21/21  Patient currently: pending echo, likely cardiac cath  Consults called: Cardiology  Admission status: Inpatient     Vianne Bulls, MD [facility]  08/19/2021, 4:20 AM

## 2021-08-19 NOTE — Progress Notes (Signed)
ANTICOAGULATION CONSULT NOTE   Pharmacy Consult for IV heparin Indication: chest pain/ACS  No Known Allergies  Patient Measurements: Height: 5\' 1"  (154.9 cm) Weight: 99.8 kg (220 lb) IBW/kg (Calculated) : 47.8 Heparin Dosing Weight: 71.8 kg  Vital Signs: Temp: 98.1 F (36.7 C) (11/09 0842) Temp Source: Oral (11/09 0842) BP: 109/72 (11/09 1217) Pulse Rate: 86 (11/09 1217)  Labs: Recent Labs    08/18/21 1953 08/18/21 2116 08/19/21 0211 08/19/21 0231 08/19/21 0322 08/19/21 1142  HGB 9.0*  --   --   --  8.7*  --   HCT 28.5*  --   --   --  27.9*  --   PLT 172  --   --   --  139*  --   APTT  --   --  54*  --   --   --   LABPROT  --   --  16.3*  --   --   --   INR  --   --  1.3*  --   --   --   HEPARINUNFRC  --   --   --  0.24*  --  0.16*  CREATININE 2.12*  --   --   --  2.33*  --   TROPONINIHS 8,194* 8,870*  --   --   --   --      Estimated Creatinine Clearance: 18.1 mL/min (A) (by C-G formula based on SCr of 2.33 mg/dL (H)).   Medical History: Past Medical History:  Diagnosis Date   Chronic anemia    Chronic back pain    Chronic kidney disease    COPD (chronic obstructive pulmonary disease) (HCC)    Coronary atherosclerosis of native coronary artery    Occluded circ with collaterals and 90% RCA (previous stents at [facility], details not clear), LVEF 48% 1/11 - inferior and inferolateral scar by Myoview 1/11,    Gout    History of blood transfusion    History of pneumonia    Hyperlipidemia    Hypertension    Myocardial infarction ([place]) 1997   Nocturia    Osteoarthritis    Pulmonary embolism (HCC)    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 EKG unchanged with from previous. Previously on coumadin for DVT and PE, but no longer on anticoagulation. Pharmacy consulted to start IV heparin for ACS.   Heparin level subtherapeutic at 0.16, CBC stable this am.     Goal of Therapy:  Heparin level 0.3-0.7 units/ml Monitor platelets by anticoagulation protocol: Yes   Plan:  Increase heparin to 1250 units/h Recheck heparin level in 8h  Arrie Senate, PharmD, [place], [place] Pharmacist [phone] Please check AMION for all [place] numbers 08/19/2021

## 2021-08-19 NOTE — Progress Notes (Signed)
Pt transferred from ED to 4E10. Alert and fully oriented x 4. Denied chest pain on arrival. Sinus rhythm on the monitor. When she ambulated from stretcher to her bed, EKG showed tachycardia on the monitor, HR 140-150, Her HR went back to 80s at rest. BP 98/61 mmHg. SPO2 100% on room air. Denied shortness of breath. Pt daughter, Mr. Moshe Cipro is staying at bedside tonight.   CHG bath given, call bell within reach. Room oriented. No acute distress. We will continue to monitor.  Kennyth Lose, RN

## 2021-08-19 NOTE — Progress Notes (Signed)
PROGRESS NOTE  Alison [name] LFY:[id number] DOB: 05-27-1931 DOA: 08/18/2021 PCP: [name], MD   LOS: 1 day   Brief narrative:  Alison [name] is a 87 years old female with past medical history of coronary artery disease status post stents, COPD, CKD stage IV, hypertension, history of  DVT and PE with IVC filter presented to hospital with mid and lower chest pain shortness of breath and diaphoresis.  Patient also had increasing fatigue but has mild chronic cough.  He does have intermittent lower extremity swelling which is longstanding.  EMS was called and patient was brought into the hospital.  EKG showed sinus tachycardia.  Chest x-ray showed cardiomegaly with patchy airspace opacity in bilateral lower lungs reflective of atelectasis or infiltrate.  CBC showed leukocytosis.  D-dimer was elevated.  Troponin was 8.94.  Patient was given aspirin,nitroglycerin ointment IV heparin, Rocephin and Zithromax in the ED and was admitted to hospital with cardiology consultation   Assessment/Plan:  Principal Problem:   NSTEMI (non-ST elevated myocardial infarction) ([place]) Active Problems:   CAD, NATIVE VESSEL   History of pulmonary embolus (PE)   CKD (chronic kidney disease), stage IV (HCC)   Chronic combined systolic and diastolic heart failure (HCC)   Elevated d-dimer   Leukocytosis   COPD (chronic obstructive pulmonary disease) ([place])   Community acquired pneumonia  NSTEMI  Complains of shortness of breath and fatigue.  Patient had initial troponin 8194 concerning for nonST elevation MI.  Cardiology on board.  On IV heparin.  Check 2D echocardiogram.  Continue aspirin and Plavix.    Possible bacterial pneumonia. On Rocephin and Zithromax.  Had significant leukocytosis on presentation.  Procalcitonin was elevated at 2.2.  Chest x-ray showed basilar infiltrates.  T-max of 98.3 F.   Elevated d-dimer; hx of recurrent DVT and PE  Patient has IVC filter.  Currently on Coumadin.  VQ scan  showed very low probability.    Acute kidney injury CKD IV  Serum creatinine of 2.3 at this time.  Was 1.6 in October 2022.  Continue to hold losartan.  Continue to monitor closely.  Monitor BMP closely   Chronic systolic and diastolic CHF  Appears to be compensated.  Continue Lasix twice a day  COPD  Continue nebulizers.  Ambulatory difficulty at baseline.  Patient has a walker and cane at home.  Patient lives with her son.  DVT prophylaxis:    On heparin drip.   Code Status: DNR  Family Communication: Spoke with the patient's daughter at bedside.  Status is: Inpatient  Remains inpatient appropriate because: Non-ST elevation MI, pneumonia, IV antibiotic, IV heparin drip  Consultants: Cardiology  Procedures: None so far  Anti-infectives:  Azithromycin and Rocephin  Anti-infectives (From admission, onward)    Start     Dose/Rate Route Frequency Ordered Stop   08/19/21 2130  azithromycin (ZITHROMAX) 500 mg in sodium chloride 0.9 % 250 mL IVPB        500 mg 250 mL/hr over 60 Minutes Intravenous Every 24 hours 08/19/21 0419 08/23/21 2129   08/19/21 2100  cefTRIAXone (ROCEPHIN) 2 g in sodium chloride 0.9 % 100 mL IVPB        2 g 200 mL/hr over 30 Minutes Intravenous Every 24 hours 08/19/21 0419 08/23/21 2059   08/18/21 2100  cefTRIAXone (ROCEPHIN) 1 g in sodium chloride 0.9 % 100 mL IVPB        1 g 200 mL/hr over 30 Minutes Intravenous  Once 08/18/21 2054 08/18/21 2157   08/18/21  2100  azithromycin (ZITHROMAX) tablet 500 mg        500 mg Oral  Once 08/18/21 2054 08/18/21 2128      Subjective: Today, patient was seen and examined at bedside.  Patient still complains of mild chronic cough and has shortness of breath.  Has been having some productive sputum.  Denies fever.  Denies chest pain at this time.  Patient's daughter at bedside.  Objective: Vitals:   08/19/21 0842 08/19/21 1030  BP: 97/61 108/67  Pulse: 83 85  Resp: 19 18  Temp: 98.1 F (36.7 C)   SpO2:  100% 95%    Intake/Output Summary (Last 24 hours) at 08/19/2021 1044 Last data filed at 08/19/2021 0100 Gross per 24 hour  Intake 168.65 ml  Output --  Net 168.65 ml   Filed Weights   08/18/21 2100  Weight: 99.8 kg   Body mass index is 41.57 kg/m.   Physical Exam: GENERAL: Patient is alert awake and oriented. Not in obvious distress, morbidly obese, hard of hearing. HENT: No scleral pallor or icterus. Pupils equally reactive to light. Oral mucosa is moist NECK: is supple, no gross swelling noted. CHEST: No crackles or wheezes.  Diminished breath sounds bilaterally. CVS: S1 and S2 heard, no murmur. Regular rate and rhythm.  ABDOMEN: Soft, non-tender, bowel sounds are present. EXTREMITIES: No edema. CNS: Cranial nerves are intact. No focal motor deficits. SKIN: warm and dry without rashes.  Data Review: I have personally reviewed the following laboratory data and studies,  CBC: Recent Labs  Lab 08/13/21 1529 08/18/21 1953 08/19/21 0322  WBC  --  20.2* 17.1*  NEUTROABS  --   --  13.4*  HGB 10.0* 9.0* 8.7*  HCT  --  28.5* 27.9*  MCV  --  97.3 97.9  PLT  --  172 154*   Basic Metabolic Panel: Recent Labs  Lab 08/18/21 1953 08/19/21 0322  NA 135 136  K 4.3 4.1  CL 103 104  CO2 19* 20*  GLUCOSE 124* 119*  BUN 43* 45*  CREATININE 2.12* 2.33*  CALCIUM 9.0 9.0   Liver Function Tests: Recent Labs  Lab 08/18/21 1953  AST 51*  ALT 26  ALKPHOS 66  BILITOT 0.8  PROT 6.8  ALBUMIN 2.9*   Recent Labs  Lab 08/18/21 1953  LIPASE 30   No results for input(s): AMMONIA in the last 168 hours. Cardiac Enzymes: No results for input(s): CKTOTAL, CKMB, CKMBINDEX, TROPONINI in the last 168 hours. BNP (last 3 results) No results for input(s): BNP in the last 8760 hours.  ProBNP (last 3 results) No results for input(s): PROBNP in the last 8760 hours.  CBG: Recent Labs  Lab 08/18/21 1920  GLUCAP 119*   Recent Results (from the past 240 hour(s))  Resp Panel by  RT-PCR (Flu A&B, Covid) Nasopharyngeal Swab     Status: None   Collection Time: 08/18/21  7:16 PM   Specimen: Nasopharyngeal Swab; Nasopharyngeal(NP) swabs in vial transport medium  Result Value Ref Range Status   SARS Coronavirus 2 by RT PCR NEGATIVE NEGATIVE Final    Comment: (NOTE) SARS-CoV-2 target nucleic acids are NOT DETECTED.  The SARS-CoV-2 RNA is generally detectable in upper respiratory specimens during the acute phase of infection. The lowest concentration of SARS-CoV-2 viral copies this assay can detect is 138 copies/mL. A negative result does not preclude SARS-Cov-2 infection and should not be used as the sole basis for treatment or other patient management decisions. A negative result may occur with  improper specimen collection/handling, submission of specimen other than nasopharyngeal swab, presence of viral mutation(s) within the areas targeted by this assay, and inadequate number of viral copies(<138 copies/mL). A negative result must be combined with clinical observations, patient history, and epidemiological information. The expected result is Negative.  Fact Sheet for Patients:  EntrepreneurPulse.com.au  Fact Sheet for Healthcare Providers:  IncredibleEmployment.be  This test is no t yet approved or cleared by the [place] FDA and  has been authorized for detection and/or diagnosis of SARS-CoV-2 by FDA under an Emergency Use Authorization (EUA). This EUA will remain  in effect (meaning this test can be used) for the duration of the COVID-19 declaration under Section 564(b)(1) of the Act, 21 U.S.C.section 360bbb-3(b)(1), unless the authorization is terminated  or revoked sooner.       Influenza A by PCR NEGATIVE NEGATIVE Final   Influenza B by PCR NEGATIVE NEGATIVE Final    Comment: (NOTE) The Xpert Xpress SARS-CoV-2/FLU/RSV plus assay is intended as an aid in the diagnosis of influenza from Nasopharyngeal swab  specimens and should not be used as a sole basis for treatment. Nasal washings and aspirates are unacceptable for Xpert Xpress SARS-CoV-2/FLU/RSV testing.  Fact Sheet for Patients: EntrepreneurPulse.com.au  Fact Sheet for Healthcare Providers: IncredibleEmployment.be  This test is not yet approved or cleared by the [place] FDA and has been authorized for detection and/or diagnosis of SARS-CoV-2 by FDA under an Emergency Use Authorization (EUA). This EUA will remain in effect (meaning this test can be used) for the duration of the COVID-19 declaration under Section 564(b)(1) of the Act, 21 U.S.C. section 360bbb-3(b)(1), unless the authorization is terminated or revoked.  Performed at [facility], [place] [address] [place], [place] [address]      Studies: NM Pulmonary Perfusion  Result Date: 08/19/2021 CLINICAL DATA:  PE suspected, intermediate probability, positive D-dimer EXAM: NUCLEAR MEDICINE PERFUSION LUNG SCAN TECHNIQUE: Perfusion images were obtained in multiple projections after intravenous injection of radiopharmaceutical. Ventilation scans intentionally deferred if perfusion scan and chest x-ray adequate for interpretation during COVID 19 epidemic. RADIOPHARMACEUTICALS:  4.2 mCi Tc-54m MAA IV COMPARISON:  Chest radiograph, 08/18/2021 FINDINGS: Homogeneous pulmonary perfusion bilaterally without suspicious perfusion defect. Cardiomegaly. IMPRESSION: 1. Very low probability for pulmonary embolism by modified perfusion only PIOPED criteria) PE absent). 2.  Cardiomegaly. Electronically Signed   By: Delanna Ahmadi M.D.   On: 08/19/2021 08:13   DG Chest Portable 1 View  Result Date: 08/18/2021 CLINICAL DATA:  Chest pain. EXAM: PORTABLE CHEST 1 VIEW COMPARISON:  08/29/2016. FINDINGS: The heart is enlarged. The mediastinal structures are stable. Atherosclerotic calcification of the aorta is noted. Patchy airspace disease is noted in the mid  to lower lung field on the left and at the right lung base. There is no effusion or pneumothorax. No acute osseous abnormality. IMPRESSION: 1. Cardiomegaly. 2. Patchy airspace disease in the mid to lower lung field on the left at the right lung base, possible atelectasis or infiltrate. Electronically Signed   By: Brett Fairy M.D.   On: 08/18/2021 20:00      Flora Lipps, MD  [facility] 08/19/2021  If 7PM-7AM, please contact night-coverage

## 2021-08-19 NOTE — Progress Notes (Signed)
ANTICOAGULATION CONSULT NOTE   Pharmacy Consult for IV heparin Indication: chest pain/ACS  No Known Allergies  Patient Measurements: Height: 5\' 1"  (154.9 cm) Weight: 99.8 kg (220 lb) IBW/kg (Calculated) : 47.8 Heparin Dosing Weight: 71.8 kg  Vital Signs: Temp: 98.3 F (36.8 C) (11/09 0345) Temp Source: Oral (11/09 0345) BP: 92/59 (11/09 0346) Pulse Rate: 88 (11/09 0346)  Labs: Recent Labs    08/18/21 1953 08/18/21 2116 08/19/21 0211 08/19/21 0231 08/19/21 0322  HGB 9.0*  --   --   --  8.7*  HCT 28.5*  --   --   --  27.9*  PLT 172  --   --   --  139*  APTT  --   --  54*  --   --   LABPROT  --   --  16.3*  --   --   INR  --   --  1.3*  --   --   HEPARINUNFRC  --   --   --  0.24*  --   CREATININE 2.12*  --   --   --   --   TROPONINIHS 8,194* 8,675*  --   --   --      Estimated Creatinine Clearance: 19.9 mL/min (A) (by C-G formula based on SCr of 2.12 mg/dL (H)).   Medical History: Past Medical History:  Diagnosis Date   Chronic anemia    Chronic back pain    Chronic kidney disease    COPD (chronic obstructive pulmonary disease) (HCC)    Coronary atherosclerosis of native coronary artery    Occluded circ with collaterals and 90% RCA (previous stents at [facility], details not clear), LVEF 48% 1/11 - inferior and inferolateral scar by Myoview 1/11,    Gout    History of blood transfusion    History of pneumonia    Hyperlipidemia    Hypertension    Myocardial infarction ([place]) 1997   Nocturia    Osteoarthritis    Pulmonary embolism (HCC)    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 EKG unchanged with from previous. Previously on coumadin for DVT and PE, but no longer on anticoagulation. Pharmacy consulted to start IV heparin for ACS.   11/9 AM update:  Heparin level just below goal   Goal of Therapy:  Heparin level 0.3-0.7 units/ml Monitor platelets by  anticoagulation protocol: Yes   Plan:  Inc heparin to 1050 units/hr 1200 heparin level  Narda Bonds, PharmD, BCPS Clinical Pharmacist Phone: [phone]

## 2021-08-19 NOTE — Progress Notes (Signed)
Progress Note  Patient Name: Alison [name] Date of Encounter: 08/19/2021  [facility] Cardiologist: Rozann Lesches, MD   Subjective   No further chest pain since admission.  Patient notes that she has been short of breath with activity since April 2021.  She lives at home with her son and daughter-in-law.  She is physically limited because of advanced age and shortness of breath.  She had severe chest pain yesterday now resolved.  Inpatient Medications    Scheduled Meds:  aspirin EC  81 mg Oral Daily   furosemide  20 mg Oral BID   metoprolol succinate  25 mg Oral Daily   mometasone-formoterol  2 puff Inhalation BID   rosuvastatin  20 mg Oral Daily   Continuous Infusions:  azithromycin     cefTRIAXone (ROCEPHIN)  IV     heparin 1,050 Units/hr (08/19/21 0400)   PRN Meds: acetaminophen, nitroGLYCERIN   Vital Signs    Vitals:   08/19/21 0343 08/19/21 0345 08/19/21 0346 08/19/21 0842  BP: (!) 90/56 (!) 86/57 (!) 92/59 97/61  Pulse: 87 86 88 83  Resp: 20 18 20 19   Temp:  98.3 F (36.8 C)  98.1 F (36.7 C)  TempSrc:  Oral  Oral  SpO2: 100% 100% 100% 100%  Weight:      Height:        Intake/Output Summary (Last 24 hours) at 08/19/2021 0939 Last data filed at 08/19/2021 0100 Gross per 24 hour  Intake 168.65 ml  Output --  Net 168.65 ml   Last 3 Weights 08/18/2021 07/30/2021 07/16/2021  Weight (lbs) 220 lb 220 lb 220 lb  Weight (kg) 99.791 kg 99.791 kg 99.791 kg      Telemetry    Sinus rhythm without significant arrhythmia- Personally Reviewed  ECG    Normal sinus rhythm with age-indeterminate anteroseptal infarct, left axis deviation - Personally Reviewed  Physical Exam  Alert, oriented, elderly woman in no distress GEN: No acute distress.   Neck: No JVD Cardiac: RRR, no murmurs, rubs, or gallops.  Respiratory: Clear to auscultation bilaterally. GI: Soft, nontender, non-distended  MS: No edema; No deformity. Neuro:  Nonfocal  Psych: Normal  affect   Labs    High Sensitivity Troponin:   Recent Labs  Lab 08/18/21 1953 08/18/21 2116  TROPONINIHS 8,194* 8,870*     Chemistry Recent Labs  Lab 08/18/21 1953 08/19/21 0322  NA 135 136  K 4.3 4.1  CL 103 104  CO2 19* 20*  GLUCOSE 124* 119*  BUN 43* 45*  CREATININE 2.12* 2.33*  CALCIUM 9.0 9.0  PROT 6.8  --   ALBUMIN 2.9*  --   AST 51*  --   ALT 26  --   ALKPHOS 66  --   BILITOT 0.8  --   GFRNONAA 22* 20*  ANIONGAP 13 12    Lipids  Recent Labs  Lab 08/19/21 0322  CHOL 84  TRIG 53  HDL 51  LDLCALC 22  CHOLHDL 1.6    Hematology Recent Labs  Lab 08/13/21 1529 08/18/21 1953 08/19/21 0322  WBC  --  20.2* 17.1*  RBC  --  2.93* 2.85*  HGB 10.0* 9.0* 8.7*  HCT  --  28.5* 27.9*  MCV  --  97.3 97.9  MCH  --  30.7 30.5  MCHC  --  31.6 31.2  RDW  --  14.9 14.9  PLT  --  172 139*   Thyroid  Recent Labs  Lab 08/19/21 0231  TSH 3.553  BNPNo results for input(s): BNP, PROBNP in the last 168 hours.  DDimer  Recent Labs  Lab 08/18/21 1953  DDIMER 1.64*     Radiology    NM Pulmonary Perfusion  Result Date: 08/19/2021 CLINICAL DATA:  PE suspected, intermediate probability, positive D-dimer EXAM: NUCLEAR MEDICINE PERFUSION LUNG SCAN TECHNIQUE: Perfusion images were obtained in multiple projections after intravenous injection of radiopharmaceutical. Ventilation scans intentionally deferred if perfusion scan and chest x-ray adequate for interpretation during COVID 19 epidemic. RADIOPHARMACEUTICALS:  4.2 mCi Tc-25m MAA IV COMPARISON:  Chest radiograph, 08/18/2021 FINDINGS: Homogeneous pulmonary perfusion bilaterally without suspicious perfusion defect. Cardiomegaly. IMPRESSION: 1. Very low probability for pulmonary embolism by modified perfusion only PIOPED criteria) PE absent). 2.  Cardiomegaly. Electronically Signed   By: Delanna Ahmadi M.D.   On: 08/19/2021 08:13   DG Chest Portable 1 View  Result Date: 08/18/2021 CLINICAL DATA:  Chest pain. EXAM:  PORTABLE CHEST 1 VIEW COMPARISON:  08/29/2016. FINDINGS: The heart is enlarged. The mediastinal structures are stable. Atherosclerotic calcification of the aorta is noted. Patchy airspace disease is noted in the mid to lower lung field on the left and at the right lung base. There is no effusion or pneumothorax. No acute osseous abnormality. IMPRESSION: 1. Cardiomegaly. 2. Patchy airspace disease in the mid to lower lung field on the left at the right lung base, possible atelectasis or infiltrate. Electronically Signed   By: Brett Fairy M.D.   On: 08/18/2021 20:00    Cardiac Studies   Pending  Patient Profile     87 y.o. female with history of remote PCI, hypertension, mixed hyperlipidemia, stage 4 chronic kidney disease, prior PE status post IVC filter placement, and heart failure with reduced ejection fraction, presents with non-STEMI.  Assessment & Plan    1.  Non-STEMI: Continue IV heparin, check 2D echocardiogram, treat with aspirin and clopidogrel.  Difficult decision regarding cardiac catheterization.  I think this patient is at high risk of adverse outcomes at age 87 with stage IV chronic kidney disease.  Creatinine is 2.33 this morning up from 2.1 on admission.  1 month ago her creatinine was 1.66 but reviewing labs over the past year she ranges between 1.66 and 2.5.  Lengthy discussion with the patient and her daughter.  Favor conservative management if possible.  Will monitor for recurrent chest pain.  We will update plan based on 2D echo results.  Patient is okay to have a cardiac diet today and we will reassess things tomorrow.  I would continue IV heparin for 48 hours. 2.  AKI on background of chronic kidney disease stage IV.  Creatinine trend over past month is 1.66 up to 2.33 today.  Hold ARB.  Follow chemistry profile again tomorrow.  Avoid nephrotoxins. 3.  Pulmonary infiltrate: Management per primary team, patient currently on antibiotics.  Disposition: Continue IV heparin, check  2D echocardiogram, repeat metabolic panel tomorrow.  Again, lengthy discussion regarding considerations around cardiac catheterization and PCI.  In this 87 year old woman with poor functional capacity and CKD 4, I think it is best to avoid cardiac catheterization if possible.  We will attempt to treat her with medical therapy.    For questions or updates, please contact [place] Please consult [email or web address] for contact info under        Signed, Sherren Mocha, MD  08/19/2021, 9:39 AM

## 2021-08-20 DIAGNOSIS — I214 Non-ST elevation (NSTEMI) myocardial infarction: Secondary | ICD-10-CM | POA: Diagnosis not present

## 2021-08-20 DIAGNOSIS — N184 Chronic kidney disease, stage 4 (severe): Secondary | ICD-10-CM | POA: Diagnosis not present

## 2021-08-20 DIAGNOSIS — I5042 Chronic combined systolic (congestive) and diastolic (congestive) heart failure: Secondary | ICD-10-CM | POA: Diagnosis not present

## 2021-08-20 LAB — BASIC METABOLIC PANEL
Anion gap: 13 (ref 5–15)
BUN: 56 mg/dL — ABNORMAL HIGH (ref 8–23)
CO2: 16 mmol/L — ABNORMAL LOW (ref 22–32)
Calcium: 8.6 mg/dL — ABNORMAL LOW (ref 8.9–10.3)
Chloride: 107 mmol/L (ref 98–111)
Creatinine, Ser: 2.8 mg/dL — ABNORMAL HIGH (ref 0.44–1.00)
GFR, Estimated: 16 mL/min — ABNORMAL LOW (ref >60)
Glucose, Bld: 143 mg/dL — ABNORMAL HIGH (ref 70–99)
Potassium: 4.4 mmol/L (ref 3.5–5.1)
Sodium: 136 mmol/L (ref 135–145)

## 2021-08-20 LAB — CBC
HCT: 27.9 % — ABNORMAL LOW (ref 36.0–46.0)
Hemoglobin: 8.5 g/dL — ABNORMAL LOW (ref 12.0–15.0)
MCH: 30.4 pg (ref 26.0–34.0)
MCHC: 30.5 g/dL (ref 30.0–36.0)
MCV: 99.6 fL (ref 80.0–100.0)
Platelets: 159 10*3/uL (ref 150–400)
RBC: 2.8 MIL/uL — ABNORMAL LOW (ref 3.87–5.11)
RDW: 15.2 % (ref 11.5–15.5)
WBC: 13.8 10*3/uL — ABNORMAL HIGH (ref 4.0–10.5)
nRBC: 0.3 % — ABNORMAL HIGH (ref 0.0–0.2)

## 2021-08-20 LAB — PHOSPHORUS: Phosphorus: 5.3 mg/dL — ABNORMAL HIGH (ref 2.5–4.6)

## 2021-08-20 LAB — PROCALCITONIN: Procalcitonin: 3.24 ng/mL

## 2021-08-20 LAB — HEPARIN LEVEL (UNFRACTIONATED): Heparin Unfractionated: 0.37 IU/mL (ref 0.30–0.70)

## 2021-08-20 LAB — MAGNESIUM: Magnesium: 1.8 mg/dL (ref 1.7–2.4)

## 2021-08-20 MED ORDER — METOPROLOL TARTRATE 12.5 MG HALF TABLET
12.5000 mg | ORAL_TABLET | Freq: Once | ORAL | Status: AC
  Administered 2021-08-20: 12.5 mg via ORAL
  Filled 2021-08-20: qty 1

## 2021-08-20 MED ORDER — SODIUM CHLORIDE 0.9 % IV SOLN: INTRAVENOUS | Status: DC

## 2021-08-20 NOTE — Progress Notes (Signed)
Called to bedside by RN for bursts of narrow complex tachycardia. Telemetry reviewed and appears atrial tachycardia. Pt is asymptomatic. Heparin gtt running. She received 12.5 mg toprol this morning. Will administer an additional 25 mg metoprolol tartrate.

## 2021-08-20 NOTE — Progress Notes (Signed)
ANTICOAGULATION CONSULT NOTE   Pharmacy Consult for IV heparin Indication: chest pain/ACS  No Known Allergies  Patient Measurements: Height: 5\' 1"  (154.9 cm) Weight: 99.8 kg (220 lb) IBW/kg (Calculated) : 47.8 Heparin Dosing Weight: 71.8 kg  Vital Signs: Temp: 98.1 F (36.7 C) (11/10 0740) Temp Source: Oral (11/10 0740) BP: 108/71 (11/10 0740) Pulse Rate: 78 (11/10 0740)  Labs: Recent Labs    08/18/21 1953 08/18/21 2116 08/19/21 0211 08/19/21 0231 08/19/21 0322 08/19/21 1142 08/19/21 2326 08/20/21 0109 08/20/21 1005  HGB 9.0*  --   --   --  8.7*  --   --  8.5*  --   HCT 28.5*  --   --   --  27.9*  --   --  27.9*  --   PLT 172  --   --   --  139*  --   --  159  --   APTT  --   --  54*  --   --   --   --   --   --   LABPROT  --   --  16.3*  --   --   --   --   --   --   INR  --   --  1.3*  --   --   --   --   --   --   HEPARINUNFRC  --   --   --    < >  --  0.16* 0.28*  --  0.37  CREATININE 2.12*  --   --   --  2.33*  --   --  2.80*  --   TROPONINIHS 8,194* 8,870*  --   --   --   --   --   --   --    < > = values in this interval not displayed.     Estimated Creatinine Clearance: 15 mL/min (A) (by C-G formula based on SCr of 2.8 mg/dL (H)).   Medical History: Past Medical History:  Diagnosis Date   Chronic anemia    Chronic back pain    Chronic kidney disease    COPD (chronic obstructive pulmonary disease) (HCC)    Coronary atherosclerosis of native coronary artery    Occluded circ with collaterals and 90% RCA (previous stents at [facility], details not clear), LVEF 48% 1/11 - inferior and inferolateral scar by Myoview 1/11,    Gout    History of blood transfusion    History of pneumonia    Hyperlipidemia    Hypertension    Myocardial infarction ([place]) 1997   Nocturia    Osteoarthritis    Pulmonary embolism (HCC)    History of recurrent DVT and PE status post IVC filter, previously on Coumadin    Assessment: 87 yo F presented with chest pain ongoing for  months. EKG unchanged with from previous. Pharmacy consulted to start IV heparin for ACS x48h.   Heparin level therapeutic at 0.37 on 1350 units/hr. H/H low but stable. No s/sx of bleeding noted.   Goal of Therapy:  Heparin level 0.3-0.7 units/ml Monitor platelets by anticoagulation protocol: Yes   Plan:  Cont. heparin at 1350 units/hr until 11/10 @2200  Monitor CBC, HL, and s/sx bleeding daily while on heparin  Joseph Art, Pharm.D. PGY-1 Pharmacy Resident [phone] 08/20/2021 12:17 PM

## 2021-08-20 NOTE — Progress Notes (Signed)
RN notified cardiology regarding elevated heart rate  Edwena Blow, RN

## 2021-08-20 NOTE — Progress Notes (Signed)
Progress Note  Patient Name: Alison [name] Date of Encounter: 08/20/2021  [facility] Cardiologist: Rozann Lesches, MD   Subjective   No chest pain funny feeling in head with headache   Inpatient Medications    Scheduled Meds:  aspirin EC  81 mg Oral Daily   clopidogrel  75 mg Oral Daily   isosorbide mononitrate  15 mg Oral Daily   metoprolol succinate  25 mg Oral Daily   mometasone-formoterol  2 puff Inhalation BID   rosuvastatin  20 mg Oral Daily   Continuous Infusions:  sodium chloride     azithromycin 500 mg (08/19/21 2259)   cefTRIAXone (ROCEPHIN)  IV Stopped (08/19/21 2213)   heparin 1,350 Units/hr (08/20/21 0150)   PRN Meds: acetaminophen, nitroGLYCERIN   Vital Signs    Vitals:   08/19/21 2340 08/20/21 0351 08/20/21 0410 08/20/21 0740  BP: 134/87 (!) 87/55 101/73 108/71  Pulse: 88 89 89 78  Resp: 20 16  20   Temp: 98.5 F (36.9 C) 98.1 F (36.7 C)  98.1 F (36.7 C)  TempSrc: Oral Oral  Oral  SpO2: 100% 96% 94% 96%  Weight:      Height:        Intake/Output Summary (Last 24 hours) at 08/20/2021 0802 Last data filed at 08/20/2021 0400 Gross per 24 hour  Intake 733.18 ml  Output 0 ml  Net 733.18 ml   Last 3 Weights 08/18/2021 07/30/2021 07/16/2021  Weight (lbs) 220 lb 220 lb 220 lb  Weight (kg) 99.791 kg 99.791 kg 99.791 kg      Telemetry    Sinus rhythm without significant arrhythmia- Personally Reviewed  ECG    NSR anteroseptal infarct   Physical Exam   Affect appropriate Frail elderly black female HEENT: edentulous  Neck supple with no adenopathy JVP normal no bruits no thyromegaly Lungs clear with no wheezing and good diaphragmatic motion Heart:  S1/S2 no murmur, no rub, gallop or click PMI normal Abdomen: benighn, BS positve, no tenderness, no AAA no bruit.  No HSM or HJR Distal pulses intact with no bruits No edema Neuro non-focal Skin warm and dry No muscular weakness   Labs    High Sensitivity Troponin:    Recent Labs  Lab 08/18/21 1953 08/18/21 2116  TROPONINIHS 8,194* 8,870*     Chemistry Recent Labs  Lab 08/18/21 1953 08/19/21 0322 08/20/21 0109  NA 135 136 136  K 4.3 4.1 4.4  CL 103 104 107  CO2 19* 20* 16*  GLUCOSE 124* 119* 143*  BUN 43* 45* 56*  CREATININE 2.12* 2.33* 2.80*  CALCIUM 9.0 9.0 8.6*  MG  --   --  1.8  PROT 6.8  --   --   ALBUMIN 2.9*  --   --   AST 51*  --   --   ALT 26  --   --   ALKPHOS 66  --   --   BILITOT 0.8  --   --   GFRNONAA 22* 20* 16*  ANIONGAP 13 12 13     Lipids  Recent Labs  Lab 08/19/21 0322  CHOL 84  TRIG 53  HDL 51  LDLCALC 22  CHOLHDL 1.6    Hematology Recent Labs  Lab 08/18/21 1953 08/19/21 0322 08/20/21 0109  WBC 20.2* 17.1* 13.8*  RBC 2.93* 2.85* 2.80*  HGB 9.0* 8.7* 8.5*  HCT 28.5* 27.9* 27.9*  MCV 97.3 97.9 99.6  MCH 30.7 30.5 30.4  MCHC 31.6 31.2 30.5  RDW 14.9 14.9  15.2  PLT 172 139* 159   Thyroid  Recent Labs  Lab 08/19/21 0231  TSH 3.553    BNPNo results for input(s): BNP, PROBNP in the last 168 hours.  DDimer  Recent Labs  Lab 08/18/21 1953  DDIMER 1.64*     Radiology    NM Pulmonary Perfusion  Result Date: 08/19/2021 CLINICAL DATA:  PE suspected, intermediate probability, positive D-dimer EXAM: NUCLEAR MEDICINE PERFUSION LUNG SCAN TECHNIQUE: Perfusion images were obtained in multiple projections after intravenous injection of radiopharmaceutical. Ventilation scans intentionally deferred if perfusion scan and chest x-ray adequate for interpretation during COVID 19 epidemic. RADIOPHARMACEUTICALS:  4.2 mCi Tc-61m MAA IV COMPARISON:  Chest radiograph, 08/18/2021 FINDINGS: Homogeneous pulmonary perfusion bilaterally without suspicious perfusion defect. Cardiomegaly. IMPRESSION: 1. Very low probability for pulmonary embolism by modified perfusion only PIOPED criteria) PE absent). 2.  Cardiomegaly. Electronically Signed   By: Delanna Ahmadi M.D.   On: 08/19/2021 08:13   DG Chest Portable 1  View  Result Date: 08/18/2021 CLINICAL DATA:  Chest pain. EXAM: PORTABLE CHEST 1 VIEW COMPARISON:  08/29/2016. FINDINGS: The heart is enlarged. The mediastinal structures are stable. Atherosclerotic calcification of the aorta is noted. Patchy airspace disease is noted in the mid to lower lung field on the left and at the right lung base. There is no effusion or pneumothorax. No acute osseous abnormality. IMPRESSION: 1. Cardiomegaly. 2. Patchy airspace disease in the mid to lower lung field on the left at the right lung base, possible atelectasis or infiltrate. Electronically Signed   By: Brett Fairy M.D.   On: 08/18/2021 20:00   ECHOCARDIOGRAM COMPLETE  Result Date: 08/19/2021    ECHOCARDIOGRAM REPORT   Patient Name:   Alison [name] Date of Exam: 08/19/2021 Medical Rec #:  [id number]           Height:       61.0 in Accession #:    [id number]          Weight:       220.0 lb Date of Birth:  12/01/1930           BSA:          1.967 m Patient Age:    87 years            BP:           90/61 mmHg Patient Gender: F                   HR:           83 bpm. Exam Location:  Inpatient Procedure: 2D Echo, Cardiac Doppler and Color Doppler Indications:    NSTEMI  History:        Patient has prior history of Echocardiogram examinations. CAD,                 COPD; Risk Factors:Hypertension.  Sonographer:    Jyl Heinz Referring Phys: [id number] [place]  1. Left ventricular ejection fraction, by estimation, is 35%. The left ventricle has moderately decreased function. The left ventricle demonstrates regional wall motion abnormalities with basal to mid anterolateral, inferolateral, and inferior akinesis.  Left ventricular diastolic parameters are consistent with Grade I diastolic dysfunction (impaired relaxation).  2. D-shaped interventricular septum suggestive of RV pressure/volume overload. Right ventricular systolic function is moderately reduced. The right ventricular size is mildly enlarged. There  is normal pulmonary artery systolic pressure. The estimated right ventricular systolic pressure is 95.6 mmHg.  3. Left atrial size  was mildly dilated.  4. Right atrial size was mildly dilated.  5. The mitral valve is normal in structure. No evidence of mitral valve regurgitation. No evidence of mitral stenosis.  6. Tricuspid valve regurgitation is moderate.  7. The aortic valve is tricuspid. Aortic valve regurgitation is not visualized. No aortic stenosis is present.  8. The inferior vena cava is normal in size with <50% respiratory variability, suggesting right atrial pressure of 8 mmHg. FINDINGS  Left Ventricle: Left ventricular ejection fraction, by estimation, is 35%. The left ventricle has moderately decreased function. The left ventricle demonstrates regional wall motion abnormalities. The left ventricular internal cavity size was normal in size. There is no left ventricular hypertrophy. Left ventricular diastolic parameters are consistent with Grade I diastolic dysfunction (impaired relaxation). Right Ventricle: D-shaped interventricular septum suggestive of RV pressure/volume overload. The right ventricular size is mildly enlarged. No increase in right ventricular wall thickness. Right ventricular systolic function is moderately reduced. There is normal pulmonary artery systolic pressure. The tricuspid regurgitant velocity is 2.58 m/s, and with an assumed right atrial pressure of 8 mmHg, the estimated right ventricular systolic pressure is 78.6 mmHg. Left Atrium: Left atrial size was mildly dilated. Right Atrium: Right atrial size was mildly dilated. Pericardium: There is no evidence of pericardial effusion. Mitral Valve: The mitral valve is normal in structure. There is mild calcification of the mitral valve leaflet(s). Mild mitral annular calcification. No evidence of mitral valve regurgitation. No evidence of mitral valve stenosis. Tricuspid Valve: The tricuspid valve is normal in structure. Tricuspid  valve regurgitation is moderate. Aortic Valve: The aortic valve is tricuspid. Aortic valve regurgitation is not visualized. No aortic stenosis is present. Aortic valve peak gradient measures 4.3 mmHg. Pulmonic Valve: The pulmonic valve was normal in structure. Pulmonic valve regurgitation is trivial. Aorta: The aortic root is normal in size and structure. Venous: The inferior vena cava is normal in size with less than 50% respiratory variability, suggesting right atrial pressure of 8 mmHg. IAS/Shunts: No atrial level shunt detected by color flow Doppler.  LEFT VENTRICLE PLAX 2D LVIDd:         4.10 cm     Diastology LVIDs:         3.30 cm     LV e' medial:    5.66 cm/s LV PW:         1.30 cm     LV E/e' medial:  13.2 LV IVS:        1.10 cm     LV e' lateral:   5.87 cm/s LVOT diam:     2.10 cm     LV E/e' lateral: 12.8 LV SV:         44 LV SV Index:   22 LVOT Area:     3.46 cm  LV Volumes (MOD) LV vol d, MOD A2C: 85.5 ml LV vol d, MOD A4C: 59.8 ml LV vol s, MOD A2C: 54.6 ml LV vol s, MOD A4C: 37.1 ml LV SV MOD A2C:     30.9 ml LV SV MOD A4C:     59.8 ml LV SV MOD BP:      26.8 ml RIGHT VENTRICLE            IVC RV Basal diam:  3.80 cm    IVC diam: 2.00 cm RV Mid diam:    2.50 cm RV S prime:     4.91 cm/s TAPSE (M-mode): 0.8 cm LEFT ATRIUM  Index        RIGHT ATRIUM           Index LA diam:        3.30 cm 1.68 cm/m   RA Area:     21.00 cm LA Vol (A2C):   76.1 ml 38.69 ml/m  RA Volume:   60.10 ml  30.56 ml/m LA Vol (A4C):   41.3 ml 21.00 ml/m LA Biplane Vol: 59.2 ml 30.10 ml/m  AORTIC VALVE AV Area (Vmax): 2.69 cm AV Vmax:        104.00 cm/s AV Peak Grad:   4.3 mmHg LVOT Vmax:      80.80 cm/s LVOT Vmean:     56.500 cm/s LVOT VTI:       0.127 m  AORTA Ao Root diam: 3.20 cm Ao Asc diam:  3.40 cm MITRAL VALVE               TRICUSPID VALVE MV Area (PHT): 3.77 cm    TR Peak grad:   26.6 mmHg MV Decel Time: 201 msec    TR Vmax:        258.00 cm/s MV E velocity: 74.90 cm/s MV A velocity: 94.60 cm/s  SHUNTS  MV E/A ratio:  0.79        Systemic VTI:  0.13 m                            Systemic Diam: 2.10 cm Dalton McleanMD Electronically signed by Franki Monte Signature Date/Time: 08/19/2021/3:53:49 PM    Final     Cardiac Studies   IMPRESSIONS     1. Left ventricular ejection fraction, by estimation, is 35%. The left  ventricle has moderately decreased function. The left ventricle  demonstrates regional wall motion abnormalities with basal to mid  anterolateral, inferolateral, and inferior akinesis.   Left ventricular diastolic parameters are consistent with Grade I  diastolic dysfunction (impaired relaxation).   2. D-shaped interventricular septum suggestive of RV pressure/volume  overload. Right ventricular systolic function is moderately reduced. The  right ventricular size is mildly enlarged. There is normal pulmonary  artery systolic pressure. The estimated  right ventricular systolic pressure is 67.3 mmHg.   3. Left atrial size was mildly dilated.   4. Right atrial size was mildly dilated.   5. The mitral valve is normal in structure. No evidence of mitral valve  regurgitation. No evidence of mitral stenosis.   6. Tricuspid valve regurgitation is moderate.   7. The aortic valve is tricuspid. Aortic valve regurgitation is not  visualized. No aortic stenosis is present.   8. The inferior vena cava is normal in size with <50% respiratory  variability, suggesting right atrial pressure of 8 mmHg.   Patient Profile     87 y.o. female with history of remote PCI, hypertension, mixed hyperlipidemia, stage 4 chronic kidney disease, prior PE status post IVC filter placement, and heart failure with reduced ejection fraction, presents with non-STEMI.  Assessment & Plan    1.  Non-STEMI: Troponin came down x 1 no chest pain Discussed with daughter she is not a candidate for cath with worsening renal function, prior to admission no functional status what so ever. No chest pain this am. Medical  Rx and palliative care for any complications EF 41-93% severe pulmonary HTN.  V/Q negative for PE  2.  AKI :  Cr up to 2.88 hold lasix today  3.  Pulmonary infiltrate: Management per  primary team, patient currently on antibiotics.     For questions or updates, please contact [place] Please consult [email or web address] for contact info under        Signed, Jenkins Rouge, MD  08/20/2021, 8:02 AM

## 2021-08-20 NOTE — Progress Notes (Signed)
Patient's heart rate has been intermittently going as high as 140s. RN messaged MD. Patient asymptomatic and resting comfortably. Edwena Blow, RN

## 2021-08-20 NOTE — Progress Notes (Signed)
PROGRESS NOTE   Alison [name]  CNO:[id number] DOB: 06/20/1931 DOA: 08/18/2021 PCP: [name], MD  Brief Narrative:  87 year old black female Alison [name] status post surgery 2009, DVT pulmonary embolism 2009 with IVC filter previously on Coumadin,MI 1997 ,HTN, prior lumbar spondylolisthesis status post fusion 2017, CKD 4, HTN Came to emergency room 11/8 with radiating chest pain aggravated by deep breath-increasing fatigue-intermittent leg swelling-EKG showed sinus tach some concern for NSTEMI troponin 88,194 Rx nitroglycerin aspirin White count 20,000 dimer 1.6 CXR =?  Pneumonia Rx Rocephin azithromycin additionally  Hospital-Problem based course  Pneumonia Hypoxic respiratory failure on admission Likely precipitant for other issues continue azithromycin and ceftriaxone fluid 75 cc/H De-escalate antibiotics rapidly in about 24 to 48 hours depending on response NSTEMI Not cath candidate given advanced age and poor functional status Medical management with Imdur 15, Toprol-XL 25 and Crestor 20--not a good candidate for ACE inhibitor secondary to rising creatinine Continue heparin as directed by cardiology Continue Plavix 75 and aspirin 81 AKI probably secondary to cardiorenal syndrome-probably 1.6 is baseline History of use has not passed good stool and is not passing urine until today Bladder scan showed only 28 cc-if worsens will need to gte Urine studies and involve nephrology Shortness of breath VQ scan 11/9 rules out PE Adrenal mass 2009 with pulmonary embolism and IVC filter previously on Coumadin Monitor  DVT prophylaxis: Is on heparin Code Status: Full Family Communication: Updated daughter Alison [name] [phone] Disposition:  Status is: Inpatient  Remains inpatient appropriate because: Pneumonia and heart issues  Consultants:  Cardiology  Procedures:   Antimicrobials: Ceftriaxone azithromycin   Subjective: Awake alert coherent no distress  seems  comfortable   Objective: Vitals:   08/19/21 2000 08/19/21 2340 08/20/21 0351 08/20/21 0410  BP: 93/64 134/87 (!) 87/55 101/73  Pulse: 83 88 89 89  Resp: 20 20 16    Temp: 98.7 F (37.1 C) 98.5 F (36.9 C) 98.1 F (36.7 C)   TempSrc: Oral Oral Oral   SpO2: 94% 100% 96% 94%  Weight:      Height:        Intake/Output Summary (Last 24 hours) at 08/20/2021 0734 Last data filed at 08/20/2021 0400 Gross per 24 hour  Intake 733.18 ml  Output 0 ml  Net 733.18 ml   Filed Weights   08/18/21 2100  Weight: 99.8 kg    Examination:    Data Reviewed: personally reviewed   CBC    Component Value Date/Time   WBC 13.8 (H) 08/20/2021 0109   RBC 2.80 (L) 08/20/2021 0109   HGB 8.5 (L) 08/20/2021 0109   HGB 10.4 (L) 11/12/2009 1016   HCT 27.9 (L) 08/20/2021 0109   HCT 31.5 (L) 11/12/2009 1016   PLT 159 08/20/2021 0109   PLT 201 11/12/2009 1016   MCV 99.6 08/20/2021 0109   MCV 92.4 11/12/2009 1016   MCH 30.4 08/20/2021 0109   MCHC 30.5 08/20/2021 0109   RDW 15.2 08/20/2021 0109   RDW 14.7 (H) 11/12/2009 1016   LYMPHSABS 1.9 08/19/2021 0322   LYMPHSABS 2.2 11/12/2009 1016   MONOABS 1.6 (H) 08/19/2021 0322   MONOABS 0.6 11/12/2009 1016   EOSABS 0.0 08/19/2021 0322   EOSABS 0.2 11/12/2009 1016   BASOSABS 0.0 08/19/2021 0322   BASOSABS 0.0 11/12/2009 1016   CMP Latest Ref Rng & Units 08/20/2021 08/19/2021 08/18/2021  Glucose 70 - 99 mg/dL 143(H) 119(H) 124(H)  BUN 8 - 23 mg/dL 56(H) 45(H) 43(H)  Creatinine 0.44 - 1.00 mg/dL 2.80(H) 2.33(H) 2.12(H)  Sodium 135 - 145 mmol/L 136 136 135  Potassium 3.5 - 5.1 mmol/L 4.4 4.1 4.3  Chloride 98 - 111 mmol/L 107 104 103  CO2 22 - 32 mmol/L 16(L) 20(L) 19(L)  Calcium 8.9 - 10.3 mg/dL 8.6(L) 9.0 9.0  Total Protein 6.5 - 8.1 g/dL - - 6.8  Total Bilirubin 0.3 - 1.2 mg/dL - - 0.8  Alkaline Phos 38 - 126 U/L - - 66  AST 15 - 41 U/L - - 51(H)  ALT 0 - 44 U/L - - 26     Radiology Studies: NM Pulmonary Perfusion  Result Date:  08/19/2021 CLINICAL DATA:  PE suspected, intermediate probability, positive D-dimer EXAM: NUCLEAR MEDICINE PERFUSION LUNG SCAN TECHNIQUE: Perfusion images were obtained in multiple projections after intravenous injection of radiopharmaceutical. Ventilation scans intentionally deferred if perfusion scan and chest x-ray adequate for interpretation during COVID 19 epidemic. RADIOPHARMACEUTICALS:  4.2 mCi Tc-42m MAA IV COMPARISON:  Chest radiograph, 08/18/2021 FINDINGS: Homogeneous pulmonary perfusion bilaterally without suspicious perfusion defect. Cardiomegaly. IMPRESSION: 1. Very low probability for pulmonary embolism by modified perfusion only PIOPED criteria) PE absent). 2.  Cardiomegaly. Electronically Signed   By: Delanna Ahmadi M.D.   On: 08/19/2021 08:13   DG Chest Portable 1 View  Result Date: 08/18/2021 CLINICAL DATA:  Chest pain. EXAM: PORTABLE CHEST 1 VIEW COMPARISON:  08/29/2016. FINDINGS: The heart is enlarged. The mediastinal structures are stable. Atherosclerotic calcification of the aorta is noted. Patchy airspace disease is noted in the mid to lower lung field on the left and at the right lung base. There is no effusion or pneumothorax. No acute osseous abnormality. IMPRESSION: 1. Cardiomegaly. 2. Patchy airspace disease in the mid to lower lung field on the left at the right lung base, possible atelectasis or infiltrate. Electronically Signed   By: Brett Fairy M.D.   On: 08/18/2021 20:00   ECHOCARDIOGRAM COMPLETE  Result Date: 08/19/2021    ECHOCARDIOGRAM REPORT   Patient Name:   Alison [name] Date of Exam: 08/19/2021 Medical Rec #:  [id number]           Height:       61.0 in Accession #:    [id number]          Weight:       220.0 lb Date of Birth:  08/25/1931           BSA:          1.967 m Patient Age:    31 years            BP:           90/61 mmHg Patient Gender: F                   HR:           83 bpm. Exam Location:  Inpatient Procedure: 2D Echo, Cardiac Doppler and Color Doppler  Indications:    NSTEMI  History:        Patient has prior history of Echocardiogram examinations. CAD,                 COPD; Risk Factors:Hypertension.  Sonographer:    Jyl Heinz Referring Phys: [id number] [place]  1. Left ventricular ejection fraction, by estimation, is 35%. The left ventricle has moderately decreased function. The left ventricle demonstrates regional wall motion abnormalities with basal to mid anterolateral, inferolateral, and inferior akinesis.  Left ventricular diastolic parameters are consistent with Grade I diastolic dysfunction (impaired relaxation).  2. D-shaped interventricular septum suggestive of RV pressure/volume overload. Right ventricular systolic function is moderately reduced. The right ventricular size is mildly enlarged. There is normal pulmonary artery systolic pressure. The estimated right ventricular systolic pressure is 81.4 mmHg.  3. Left atrial size was mildly dilated.  4. Right atrial size was mildly dilated.  5. The mitral valve is normal in structure. No evidence of mitral valve regurgitation. No evidence of mitral stenosis.  6. Tricuspid valve regurgitation is moderate.  7. The aortic valve is tricuspid. Aortic valve regurgitation is not visualized. No aortic stenosis is present.  8. The inferior vena cava is normal in size with <50% respiratory variability, suggesting right atrial pressure of 8 mmHg. FINDINGS  Left Ventricle: Left ventricular ejection fraction, by estimation, is 35%. The left ventricle has moderately decreased function. The left ventricle demonstrates regional wall motion abnormalities. The left ventricular internal cavity size was normal in size. There is no left ventricular hypertrophy. Left ventricular diastolic parameters are consistent with Grade I diastolic dysfunction (impaired relaxation). Right Ventricle: D-shaped interventricular septum suggestive of RV pressure/volume overload. The right ventricular size is mildly enlarged.  No increase in right ventricular wall thickness. Right ventricular systolic function is moderately reduced. There is normal pulmonary artery systolic pressure. The tricuspid regurgitant velocity is 2.58 m/s, and with an assumed right atrial pressure of 8 mmHg, the estimated right ventricular systolic pressure is 48.1 mmHg. Left Atrium: Left atrial size was mildly dilated. Right Atrium: Right atrial size was mildly dilated. Pericardium: There is no evidence of pericardial effusion. Mitral Valve: The mitral valve is normal in structure. There is mild calcification of the mitral valve leaflet(s). Mild mitral annular calcification. No evidence of mitral valve regurgitation. No evidence of mitral valve stenosis. Tricuspid Valve: The tricuspid valve is normal in structure. Tricuspid valve regurgitation is moderate. Aortic Valve: The aortic valve is tricuspid. Aortic valve regurgitation is not visualized. No aortic stenosis is present. Aortic valve peak gradient measures 4.3 mmHg. Pulmonic Valve: The pulmonic valve was normal in structure. Pulmonic valve regurgitation is trivial. Aorta: The aortic root is normal in size and structure. Venous: The inferior vena cava is normal in size with less than 50% respiratory variability, suggesting right atrial pressure of 8 mmHg. IAS/Shunts: No atrial level shunt detected by color flow Doppler.  LEFT VENTRICLE PLAX 2D LVIDd:         4.10 cm     Diastology LVIDs:         3.30 cm     LV e' medial:    5.66 cm/s LV PW:         1.30 cm     LV E/e' medial:  13.2 LV IVS:        1.10 cm     LV e' lateral:   5.87 cm/s LVOT diam:     2.10 cm     LV E/e' lateral: 12.8 LV SV:         44 LV SV Index:   22 LVOT Area:     3.46 cm  LV Volumes (MOD) LV vol d, MOD A2C: 85.5 ml LV vol d, MOD A4C: 59.8 ml LV vol s, MOD A2C: 54.6 ml LV vol s, MOD A4C: 37.1 ml LV SV MOD A2C:     30.9 ml LV SV MOD A4C:     59.8 ml LV SV MOD BP:      26.8 ml RIGHT VENTRICLE            IVC  RV Basal diam:  3.80 cm    IVC  diam: 2.00 cm RV Mid diam:    2.50 cm RV S prime:     4.91 cm/s TAPSE (M-mode): 0.8 cm LEFT ATRIUM             Index        RIGHT ATRIUM           Index LA diam:        3.30 cm 1.68 cm/m   RA Area:     21.00 cm LA Vol (A2C):   76.1 ml 38.69 ml/m  RA Volume:   60.10 ml  30.56 ml/m LA Vol (A4C):   41.3 ml 21.00 ml/m LA Biplane Vol: 59.2 ml 30.10 ml/m  AORTIC VALVE AV Area (Vmax): 2.69 cm AV Vmax:        104.00 cm/s AV Peak Grad:   4.3 mmHg LVOT Vmax:      80.80 cm/s LVOT Vmean:     56.500 cm/s LVOT VTI:       0.127 m  AORTA Ao Root diam: 3.20 cm Ao Asc diam:  3.40 cm MITRAL VALVE               TRICUSPID VALVE MV Area (PHT): 3.77 cm    TR Peak grad:   26.6 mmHg MV Decel Time: 201 msec    TR Vmax:        258.00 cm/s MV E velocity: 74.90 cm/s MV A velocity: 94.60 cm/s  SHUNTS MV E/A ratio:  0.79        Systemic VTI:  0.13 m                            Systemic Diam: 2.10 cm Dalton McleanMD Electronically signed by Franki Monte Signature Date/Time: 08/19/2021/3:53:49 PM    Final      Scheduled Meds:  aspirin EC  81 mg Oral Daily   clopidogrel  75 mg Oral Daily   isosorbide mononitrate  15 mg Oral Daily   metoprolol succinate  25 mg Oral Daily   mometasone-formoterol  2 puff Inhalation BID   rosuvastatin  20 mg Oral Daily   Continuous Infusions:  azithromycin 500 mg (08/19/21 2259)   cefTRIAXone (ROCEPHIN)  IV Stopped (08/19/21 2213)   heparin 1,350 Units/hr (08/20/21 0150)     LOS: 2 days   Time spent: [place], MD [facility] To contact the attending provider between 7A-7P or the covering provider during after hours 7P-7A, please log into the web site [email or web address] and access using universal [place] password for that web site. If you do not have the password, please call the hospital operator.  08/20/2021, 7:34 AM

## 2021-08-20 NOTE — Progress Notes (Signed)
ANTICOAGULATION CONSULT NOTE   Pharmacy Consult for IV heparin Indication: chest pain/ACS  No Known Allergies  Patient Measurements: Height: 5\' 1"  (154.9 cm) Weight: 99.8 kg (220 lb) IBW/kg (Calculated) : 47.8 Heparin Dosing Weight: 71.8 kg  Vital Signs: Temp: 98.5 F (36.9 C) (11/09 2340) Temp Source: Oral (11/09 2340) BP: 134/87 (11/09 2340) Pulse Rate: 88 (11/09 2340)  Labs: Recent Labs    08/18/21 1953 08/18/21 2116 08/19/21 0211 08/19/21 0231 08/19/21 0322 08/19/21 1142 08/19/21 2326  HGB 9.0*  --   --   --  8.7*  --   --   HCT 28.5*  --   --   --  27.9*  --   --   PLT 172  --   --   --  139*  --   --   APTT  --   --  54*  --   --   --   --   LABPROT  --   --  16.3*  --   --   --   --   INR  --   --  1.3*  --   --   --   --   HEPARINUNFRC  --   --   --  0.24*  --  0.16* 0.28*  CREATININE 2.12*  --   --   --  2.33*  --   --   TROPONINIHS 8,194* 3,762*  --   --   --   --   --      Estimated Creatinine Clearance: 18.1 mL/min (A) (by C-G formula based on SCr of 2.33 mg/dL (H)).   Medical History: Past Medical History:  Diagnosis Date   Chronic anemia    Chronic back pain    Chronic kidney disease    COPD (chronic obstructive pulmonary disease) (HCC)    Coronary atherosclerosis of native coronary artery    Occluded circ with collaterals and 90% RCA (previous stents at [facility], details not clear), LVEF 48% 1/11 - inferior and inferolateral scar by Myoview 1/11,    Gout    History of blood transfusion    History of pneumonia    Hyperlipidemia    Hypertension    Myocardial infarction ([place]) 1997   Nocturia    Osteoarthritis    Pulmonary embolism (HCC)    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 EKG unchanged with from previous. Previously on coumadin for DVT and PE, but no longer on anticoagulation. Pharmacy consulted to start IV heparin for ACS.    11/10 AM update:  Heparin level just below goal   Goal of Therapy:  Heparin level 0.3-0.7 units/ml Monitor platelets by anticoagulation protocol: Yes   Plan:  Inc heparin to 1350 units/hr 1000 heparin level  Narda Bonds, PharmD, BCPS Clinical Pharmacist Phone: [phone]

## 2021-08-20 NOTE — Plan of Care (Signed)

## 2021-08-21 ENCOUNTER — Inpatient Hospital Stay (HOSPITAL_COMMUNITY): Payer: Medicare Other

## 2021-08-21 DIAGNOSIS — I2510 Atherosclerotic heart disease of native coronary artery without angina pectoris: Secondary | ICD-10-CM

## 2021-08-21 DIAGNOSIS — I5042 Chronic combined systolic (congestive) and diastolic (congestive) heart failure: Secondary | ICD-10-CM

## 2021-08-21 DIAGNOSIS — Z86711 Personal history of pulmonary embolism: Secondary | ICD-10-CM

## 2021-08-21 DIAGNOSIS — J449 Chronic obstructive pulmonary disease, unspecified: Secondary | ICD-10-CM

## 2021-08-21 DIAGNOSIS — N184 Chronic kidney disease, stage 4 (severe): Secondary | ICD-10-CM

## 2021-08-21 DIAGNOSIS — I214 Non-ST elevation (NSTEMI) myocardial infarction: Secondary | ICD-10-CM | POA: Diagnosis not present

## 2021-08-21 DIAGNOSIS — J189 Pneumonia, unspecified organism: Secondary | ICD-10-CM

## 2021-08-21 DIAGNOSIS — R7989 Other specified abnormal findings of blood chemistry: Secondary | ICD-10-CM

## 2021-08-21 DIAGNOSIS — D72829 Elevated white blood cell count, unspecified: Secondary | ICD-10-CM

## 2021-08-21 LAB — CBC WITH DIFFERENTIAL/PLATELET
Abs Immature Granulocytes: 0.36 10*3/uL — ABNORMAL HIGH (ref 0.00–0.07)
Basophils Absolute: 0 10*3/uL (ref 0.0–0.1)
Basophils Relative: 0 %
Eosinophils Absolute: 0 10*3/uL (ref 0.0–0.5)
Eosinophils Relative: 0 %
HCT: 25.6 % — ABNORMAL LOW (ref 36.0–46.0)
Hemoglobin: 7.8 g/dL — ABNORMAL LOW (ref 12.0–15.0)
Immature Granulocytes: 3 %
Lymphocytes Relative: 9 %
Lymphs Abs: 1.3 10*3/uL (ref 0.7–4.0)
MCH: 30.4 pg (ref 26.0–34.0)
MCHC: 30.5 g/dL (ref 30.0–36.0)
MCV: 99.6 fL (ref 80.0–100.0)
Monocytes Absolute: 0.9 10*3/uL (ref 0.1–1.0)
Monocytes Relative: 6 %
Neutro Abs: 11.9 10*3/uL — ABNORMAL HIGH (ref 1.7–7.7)
Neutrophils Relative %: 82 %
Platelets: 174 10*3/uL (ref 150–400)
RBC: 2.57 MIL/uL — ABNORMAL LOW (ref 3.87–5.11)
RDW: 15.1 % (ref 11.5–15.5)
WBC: 14.5 10*3/uL — ABNORMAL HIGH (ref 4.0–10.5)
nRBC: 0.4 % — ABNORMAL HIGH (ref 0.0–0.2)

## 2021-08-21 LAB — BASIC METABOLIC PANEL
Anion gap: 13 (ref 5–15)
BUN: 65 mg/dL — ABNORMAL HIGH (ref 8–23)
CO2: 17 mmol/L — ABNORMAL LOW (ref 22–32)
Calcium: 8.3 mg/dL — ABNORMAL LOW (ref 8.9–10.3)
Chloride: 107 mmol/L (ref 98–111)
Creatinine, Ser: 3.27 mg/dL — ABNORMAL HIGH (ref 0.44–1.00)
GFR, Estimated: 13 mL/min — ABNORMAL LOW (ref >60)
Glucose, Bld: 115 mg/dL — ABNORMAL HIGH (ref 70–99)
Potassium: 5.2 mmol/L — ABNORMAL HIGH (ref 3.5–5.1)
Sodium: 137 mmol/L (ref 135–145)

## 2021-08-21 LAB — FERRITIN: Ferritin: 1656 ng/mL — ABNORMAL HIGH (ref 11–307)

## 2021-08-21 LAB — URINALYSIS, ROUTINE W REFLEX MICROSCOPIC
Bilirubin Urine: NEGATIVE
Glucose, UA: NEGATIVE mg/dL
Hgb urine dipstick: NEGATIVE
Ketones, ur: NEGATIVE mg/dL
Leukocytes,Ua: NEGATIVE
Nitrite: NEGATIVE
Protein, ur: NEGATIVE mg/dL
Specific Gravity, Urine: 1.019 (ref 1.005–1.030)
pH: 5 (ref 5.0–8.0)

## 2021-08-21 LAB — IRON AND TIBC
Iron: 99 ug/dL (ref 28–170)
Saturation Ratios: 41 % — ABNORMAL HIGH (ref 10.4–31.8)
TIBC: 242 ug/dL — ABNORMAL LOW (ref 250–450)
UIBC: 143 ug/dL

## 2021-08-21 LAB — CREATININE, URINE, RANDOM: Creatinine, Urine: 246.36 mg/dL

## 2021-08-21 LAB — SODIUM, URINE, RANDOM: Sodium, Ur: 17 mmol/L

## 2021-08-21 MED ORDER — ATORVASTATIN CALCIUM 40 MG PO TABS
40.0000 mg | ORAL_TABLET | Freq: Every day | ORAL | Status: DC
  Administered 2021-08-22 – 2021-08-25 (×4): 40 mg via ORAL
  Filled 2021-08-21 (×4): qty 1

## 2021-08-21 MED ORDER — SODIUM ZIRCONIUM CYCLOSILICATE 5 G PO PACK
5.0000 g | PACK | Freq: Once | ORAL | Status: AC
  Administered 2021-08-21: 5 g via ORAL
  Filled 2021-08-21: qty 1

## 2021-08-21 MED ORDER — LEVOFLOXACIN 500 MG PO TABS: 500.0000 mg | ORAL_TABLET | ORAL | Status: DC

## 2021-08-21 MED ORDER — SODIUM BICARBONATE 650 MG PO TABS
650.0000 mg | ORAL_TABLET | Freq: Two times a day (BID) | ORAL | Status: DC
  Administered 2021-08-21 (×2): 650 mg via ORAL
  Filled 2021-08-21 (×2): qty 1

## 2021-08-21 MED ORDER — CEFDINIR 300 MG PO CAPS
300.0000 mg | ORAL_CAPSULE | Freq: Every day | ORAL | Status: AC
  Administered 2021-08-21 – 2021-08-25 (×5): 300 mg via ORAL
  Filled 2021-08-21 (×5): qty 1

## 2021-08-21 NOTE — Progress Notes (Signed)
Pt continuing to have episodes of ST with activity, up to 130-140s, resolves with rest.  Mobility asked to hold off on ambulating at this time.  Cardiology NP notified, at this time, no new orders received.

## 2021-08-21 NOTE — Progress Notes (Signed)
PROGRESS NOTE   Alison [name]  NLZ:[id number] DOB: 03/15/1931 DOA: 08/18/2021 PCP: Alison Beard, MD  Brief Narrative:  87 year old black female Alison [name] status post surgery 2009, DVT pulmonary embolism 2009 with IVC filter previously on Coumadin,MI 1997 ,HTN, prior lumbar spondylolisthesis status post fusion 2017, CKD 4, HTN Came to emergency room 11/8 with radiating chest pain aggravated by deep breath-increasing fatigue-intermittent leg swelling-EKG showed sinus tach some concern for NSTEMI troponin 88,194 Rx nitroglycerin aspirin White count 20,000 dimer 1.6 CXR =?  Pneumonia Rx Rocephin azithromycin additionally  Hospitalization has been complicated by worsening kidney function  Hospital-Problem based course  Pneumonia Hypoxic respiratory failure on admission Transition azithromycin and ceftriaxone to levaquin renally dosed Continue IVF for now De-escalate oxygen in the next several days NSTEMI Not cath candidate given advanced age and poor functional status Medical management Toprol-XL 25 and Crestor 20--not a good candidate for ACE inhibitor secondary to rising creatinine I stopped imdur today ensure adequate renal perfusion Heparin off since 11/11 Continue Plavix 75 and aspirin 81 NSVT Probably irritability from depressed EF Check am mag as seems to be more persistent Control as able with BB AKI probably secondary to cardiorenal syndrome-probably 1.6 is baseline Bladder scan showed only 28 cc- Await urine studies ordered Get US Renal today as she had some cysts in the past She is clear she doesn't wish HD and if her creatinine worsens will have to d/w renal next steps Shortness of breath VQ scan 11/9 rules out PE Adrenal mass 2009 with pulmonary embolism and IVC filter previously on Coumadin Monitor  DVT prophylaxis: Is on heparin Code Status: Full Family Communication: Updated daughter Alison [name] [phone] 10/20/20 Disposition:  Status is:  Inpatient  Remains inpatient appropriate because: Pneumonia and heart issues  Consultants:  Cardiology  Procedures:   Antimicrobials: Ceftriaxone azithromycin   Subjective: Awake alert coherent no distress  seems comfortable Laughing joking Pretty clear in her decisions about not wanting aggressive care   Objective: Vitals:   08/20/21 2317 08/21/21 0317 08/21/21 0700 08/21/21 0840  BP: 107/67 (!) 92/59  122/71  Pulse: 85 72  84  Resp: 16 18  19   Temp: 98 F (36.7 C) 97.9 F (36.6 C)  (!) 97.4 F (36.3 C)  TempSrc: Oral Oral  Oral  SpO2: 91% 100% 100% 98%  Weight:      Height:        Intake/Output Summary (Last 24 hours) at 08/21/2021 1042 Last data filed at 08/21/2021 0648 Gross per 24 hour  Intake --  Output 1100 ml  Net -1100 ml    Filed Weights   08/18/21 2100  Weight: 99.8 kg    Examination:  Awake pleasant in and no focal deficit No ict pallor S1 S2 tachycardic seems sinus--no jvd, trace LE edema  Cta b no added sound no rales rhonchi Abd soft nt nd no rebound no guard Neuro grossly intact   Data Reviewed: personally reviewed   CBC    Component Value Date/Time   WBC 14.5 (H) 08/21/2021 0214   RBC 2.57 (L) 08/21/2021 0214   HGB 7.8 (L) 08/21/2021 0214   HGB 10.4 (L) 11/12/2009 1016   HCT 25.6 (L) 08/21/2021 0214   HCT 31.5 (L) 11/12/2009 1016   PLT 174 08/21/2021 0214   PLT 201 11/12/2009 1016   MCV 99.6 08/21/2021 0214   MCV 92.4 11/12/2009 1016   MCH 30.4 08/21/2021 0214   MCHC 30.5 08/21/2021 0214   RDW 15.1 08/21/2021 0214   RDW 14.7 (H)  11/12/2009 1016   LYMPHSABS 1.3 08/21/2021 0214   LYMPHSABS 2.2 11/12/2009 1016   MONOABS 0.9 08/21/2021 0214   MONOABS 0.6 11/12/2009 1016   EOSABS 0.0 08/21/2021 0214   EOSABS 0.2 11/12/2009 1016   BASOSABS 0.0 08/21/2021 0214   BASOSABS 0.0 11/12/2009 1016   CMP Latest Ref Rng & Units 08/21/2021 08/20/2021 08/19/2021  Glucose 70 - 99 mg/dL 115(H) 143(H) 119(H)  BUN 8 - 23 mg/dL 65(H)  56(H) 45(H)  Creatinine 0.44 - 1.00 mg/dL 3.27(H) 2.80(H) 2.33(H)  Sodium 135 - 145 mmol/L 137 136 136  Potassium 3.5 - 5.1 mmol/L 5.2(H) 4.4 4.1  Chloride 98 - 111 mmol/L 107 107 104  CO2 22 - 32 mmol/L 17(L) 16(L) 20(L)  Calcium 8.9 - 10.3 mg/dL 8.3(L) 8.6(L) 9.0  Total Protein 6.5 - 8.1 g/dL - - -  Total Bilirubin 0.3 - 1.2 mg/dL - - -  Alkaline Phos 38 - 126 U/L - - -  AST 15 - 41 U/L - - -  ALT 0 - 44 U/L - - -     Radiology Studies: No results found.   Scheduled Meds:  aspirin EC  81 mg Oral Daily   clopidogrel  75 mg Oral Daily   levofloxacin  500 mg Oral Q48H   metoprolol succinate  25 mg Oral Daily   mometasone-formoterol  2 puff Inhalation BID   rosuvastatin  20 mg Oral Daily   Continuous Infusions:  sodium chloride 75 mL/hr at 08/21/21 0323     LOS: 3 days   Time spent: [place], MD [facility] To contact the attending provider between 7A-7P or the covering provider during after hours 7P-7A, please log into the web site [email or web address] and access using universal [place] password for that web site. If you do not have the password, please call the hospital operator.  08/21/2021, 10:42 AM

## 2021-08-21 NOTE — Progress Notes (Signed)
NT in to bladder scan, found that purewick had been dislodged, two pads soaked with urine changed.  Bladder scan revealed no urine.

## 2021-08-21 NOTE — Care Management Important Message (Signed)
Important Message  Patient Details  Name: Alison [name] MRN: [id number] Date of Birth: 04-14-1931   Medicare Important Message Given:  Yes     [name] 08/21/2021, 11:51 AM

## 2021-08-21 NOTE — Consult Note (Signed)
[facility] Renal Consultation Note  Requesting MD: Mayer Camel MD  Indication for Consultation:  AKI  Chief complaint: chest pain and shortness of breath   HPI:  Alison [name] is a 87 y.o. female with a history of coronary artery disease, COPD, CKD stage IV, hypertension, and hx of PE/DVT who presented to the hospital with chest pain accompanied by shortness of breath.  Found to have NSTEMI.  Cardiology has recommended medical therapy and did not feel that she was a candidate for cardiac catheterization or PCI given her age and renal function.  Her creatinine trends are listed below.  Creatinine on admission 2.12 and has risen to 3.27 on the date of consult.  Nephrology is consulted for assistance with management of AKI on CKD.  She usually follows with [facility]. [facility].  Last seen 08/06/21.   Per his last note Cr trend for 2022 is 1.7 - 2.5. The patient's CKD is attributed to HTN in his note.  She also states that "all my life I ate Goody powders" until about 15 years ago.  She took essentially daily for several years for headaches per her recollection.  She had 1.1 liters UOP over 11/10 and had one unmeasured void as well.  States was recently given a medication for swelling per cardiology - not sure of doses.   Creatinine, Ser  Date/Time Value Ref Range Status  08/21/2021 02:14 AM 3.27 (H) 0.44 - 1.00 mg/dL Final  08/20/2021 01:09 AM 2.80 (H) 0.44 - 1.00 mg/dL Final  08/19/2021 03:22 AM 2.33 (H) 0.44 - 1.00 mg/dL Final  08/18/2021 07:53 PM 2.12 (H) 0.44 - 1.00 mg/dL Final  07/16/2021 01:46 PM 1.66 (H) 0.44 - 1.00 mg/dL Final  06/04/2021 01:13 PM 2.23 (H) 0.44 - 1.00 mg/dL Final  02/26/2021 10:44 AM 2.14 (H) 0.44 - 1.00 mg/dL Final  01/29/2021 11:48 AM 2.50 (H) 0.44 - 1.00 mg/dL Final  11/19/2020 11:16 AM 1.98 (H) 0.44 - 1.00 mg/dL Final  09/17/2020 11:34 AM 1.94 (H) 0.44 - 1.00 mg/dL Final  09/03/2020 09:40 AM 2.16 (H) 0.44 -  1.00 mg/dL Final  08/20/2020 11:37 AM 1.97 (H) 0.44 - 1.00 mg/dL Final  08/06/2020 10:15 AM 1.92 (H) 0.44 - 1.00 mg/dL Final  07/23/2020 01:07 PM 2.28 (H) 0.44 - 1.00 mg/dL Final  09/24/2016 07:00 AM 1.45 (H) 0.44 - 1.00 mg/dL Final  09/13/2016 05:26 AM 1.66 (H) 0.44 - 1.00 mg/dL Final  09/12/2016 09:26 AM 2.03 (H) 0.44 - 1.00 mg/dL Final  09/12/2016 02:15 AM 2.27 (H) 0.44 - 1.00 mg/dL Final  09/08/2016 10:56 AM 1.46 (H) 0.44 - 1.00 mg/dL Final  08/22/2016 03:51 PM 1.32 (H) 0.44 - 1.00 mg/dL Final  07/08/2016 11:09 AM 1.43 (H) 0.44 - 1.00 mg/dL Final  03/06/2010 05:35 AM 1.48 (H) 0.4 - 1.2 mg/dL Final  03/05/2010 06:12 AM 2.19 (H) 0.4 - 1.2 mg/dL Final  03/04/2010 08:15 PM 2.57 (H) 0.4 - 1.2 mg/dL Final  03/04/2010 03:43 PM 2.84 (H) 0.4 - 1.2 mg/dL Final  03/04/2010 03:55 AM 2.26 (H) 0.4 - 1.2 mg/dL Final  02/27/2010 10:51 AM 1.22 (H) 0.4 - 1.2 mg/dL Final  11/12/2009 10:16 AM 1.11 0.40 - 1.20 mg/dL Final  08/20/2009 01:17 PM 1.16 0.40 - 1.20 mg/dL Final  11/24/2007 03:50 AM 0.94  Final  11/20/2007 02:00 PM 1.02  Final  10/27/2007 09:42 AM 1.00  Final  07/10/2007 10:12 AM 0.94  Final     PMHx:   Past Medical History:  Diagnosis Date   Chronic anemia    Chronic back pain    Chronic kidney disease    COPD (chronic obstructive pulmonary disease) (HCC)    Coronary atherosclerosis of native coronary artery    Occluded circ with collaterals and 90% RCA (previous stents at [facility], details not clear), LVEF 48% 1/11 - inferior and inferolateral scar by Myoview 1/11,    Gout    History of blood transfusion    History of pneumonia    Hyperlipidemia    Hypertension    Myocardial infarction ([place]) 1997   Nocturia    Osteoarthritis    Pulmonary embolism ([place])    History of recurrent DVT and PE status post IVC filter, previously on Coumadin    Past Surgical History:  Procedure Laterality Date   ABDOMINAL HYSTERECTOMY  1965   partial   APPENDECTOMY     BACK SURGERY     cataract  surgery Bilateral    CORONARY ANGIOPLASTY  1997   3 stents   cyst removed from back  2006   [place] 1 N/A 09/10/2016   Procedure: Posterior Lateral Fusion - Lumbar four-Lumbar five, lumbar laminectomy and pedicle screw fixation  - Lumbar four-Lumbar five;  Surgeon: Eustace Moore, MD;  Location: [place];  Service: Neurosurgery;  Laterality: N/A;   Left adrenalectomy  2009   Right breast biopsy  2008   Right knee replacement  2011    Family Hx:  Family History  Problem Relation Age of Onset   Stroke Father   Denies any known family history of CKD  Social History:  reports that she quit smoking about 43 years ago. Her smoking use included cigarettes. She smoked an average of .2 packs per day. She has never used smokeless tobacco. She reports that she does not drink alcohol and does not use drugs.  Allergies: No Known Allergies  Medications: Prior to Admission medications   Medication Sig Start Date End Date Taking? Authorizing Provider  acetaminophen (TYLENOL) 650 MG CR tablet Take 650 mg by mouth 2 (two) times daily.     [provider]  ADVAIR DISKUS 250-50 MCG/DOSE AEPB Inhale 1 Inhaler into the lungs 2 (two) times daily. 09/10/12   [provider]  albuterol (VENTOLIN HFA) 108 (90 Base) MCG/ACT inhaler Inhale 2 puffs into the lungs every 6 (six) hours as needed for wheezing.    [provider]  allopurinol (ZYLOPRIM) 100 MG tablet Take 100 mg by mouth daily.    [provider]  calcitRIOL (ROCALTROL) 0.25 MCG capsule Take 0.25 mcg by mouth daily.    [provider]  Calcium Carb-Cholecalciferol (CALCIUM 600+D3 PO) Take 1 tablet by mouth daily.    [provider]  clopidogrel (PLAVIX) 75 MG tablet TAKE 1 TABLET BY MOUTH  DAILY Patient taking differently: Take 75 mg by mouth daily. 12/01/20   Satira Sark, MD  epoetin alfa-epbx (RETACRIT) 3000 UNIT/ML injection Inject 3,000 Units into  the skin every 14 (fourteen) days.    Bhutani, Manpreet S, MD  ferrous sulfate 325 (65 FE) MG tablet Take 325 mg by mouth daily.     [provider]  furosemide (LASIX) 20 MG tablet TAKE 1 TABLET BY MOUTH  TWICE DAILY MAY TAKE 1  EXTRA TABLET DAILY AS  NEEDED FOR SWELLING OR  SHORTNESS OF BREATH Patient taking differently: Take 20 mg by mouth See admin instructions. 20mg  twice daily scheduled, and an additional 20mg  as needed for swelling  or shortness of breath 09/15/20   Verta Ellen., NP  isosorbide mononitrate (IMDUR) 30 MG 24 hr tablet TAKE 1 TABLET BY MOUTH  DAILY Patient taking differently: Take 30 mg by mouth daily. 12/01/20   Satira Sark, MD  losartan (COZAAR) 50 MG tablet Take 1 tablet (50 mg total) by mouth daily. Patient taking differently: Take 25 mg by mouth daily. 01/29/20   Verta Ellen., NP  metoprolol succinate (TOPROL-XL) 25 MG 24 hr tablet TAKE 1 TABLET BY MOUTH  DAILY Patient taking differently: Take 25 mg by mouth daily. 12/01/20   Satira Sark, MD  nitroGLYCERIN (NITROSTAT) 0.4 MG SL tablet Place 1 tablet (0.4 mg total) under the tongue every 5 (five) minutes as needed for chest pain. 12/04/20   Satira Sark, MD  potassium chloride (KLOR-CON) 10 MEQ tablet TAKE 1 TABLET BY MOUTH  DAILY Patient taking differently: Take 10 mEq by mouth daily. 12/01/20   Satira Sark, MD  rosuvastatin (CRESTOR) 10 MG tablet TAKE 1 TABLET BY MOUTH  DAILY Patient taking differently: Take 10 mg by mouth daily. 12/18/20   Satira Sark, MD    I have reviewed the patient's current and reported prior to admission medications.  Labs:  BMP Latest Ref Rng & Units 08/21/2021 08/20/2021 08/19/2021  Glucose 70 - 99 mg/dL 115(H) 143(H) 119(H)  BUN 8 - 23 mg/dL 65(H) 56(H) 45(H)  Creatinine 0.44 - 1.00 mg/dL 3.27(H) 2.80(H) 2.33(H)  Sodium 135 - 145 mmol/L 137 136 136  Potassium 3.5 - 5.1 mmol/L 5.2(H) 4.4 4.1  Chloride 98 - 111 mmol/L 107 107 104  CO2 22 - 32  mmol/L 17(L) 16(L) 20(L)  Calcium 8.9 - 10.3 mg/dL 8.3(L) 8.6(L) 9.0    Urinalysis    Component Value Date/Time   COLORURINE YELLOW 08/21/2021 1200   APPEARANCEUR HAZY (A) 08/21/2021 1200   LABSPEC 1.019 08/21/2021 1200   PHURINE 5.0 08/21/2021 1200   GLUCOSEU NEGATIVE 08/21/2021 1200   HGBUR NEGATIVE 08/21/2021 1200   BILIRUBINUR NEGATIVE 08/21/2021 1200   KETONESUR NEGATIVE 08/21/2021 1200   PROTEINUR NEGATIVE 08/21/2021 1200   UROBILINOGEN 0.2 03/03/2010 0955   NITRITE NEGATIVE 08/21/2021 1200   LEUKOCYTESUR NEGATIVE 08/21/2021 1200     ROS:  Pertinent items noted in HPI and remainder of comprehensive ROS otherwise negative.  Physical Exam: Vitals:   08/21/21 0840 08/21/21 1212  BP: 122/71 119/69  Pulse: 84 82  Resp: 19 20  Temp: (!) 97.4 F (36.3 C) 97.7 F (36.5 C)  SpO2: 98% 100%     General: elderly female in bed in NAD after rest but tired after PT HEENT:  NCAT Eyes: EOMI sclera anicteric Neck: supple trachea midline Heart: S1S2 no rub Lungs: clear but reduced on auscultation; unlabored at rest but increased with exertion; on 2 liters oxygen nasal cannula  Abdomen: soft/NT/ND Extremities: no pitting edema; no cyanosis or clubbing Skin: no rash on extremities exposed Neuro: alert and oriented x 3 provides hx and follows commands Psych normal mood and affect  Assessment/Plan:  # Acute kidney injury - ATN in setting of NSTEMI.  Setting of CKD 2/2 HTN and chronic NSAID's and limited renal reserve - Continue supportive care; hopeful for plateau  - a little short of breath with exertion - stop IV fluids for now - Would hold her losartan as you are - changed to renal diet.  Her potassium supplement was recommended to be stopped at last nephrology appt  - note renal ultrasound  obtained   # NSTEMI - Per cardiology she is for medical management - off of ARB with AKI   # CKD stage IV - Secondary to HTN and chronic NSAID's - baseline Cr 1.7-2.5 this year  and follows with Dr. Theador Hawthorne  # HTN  - off of home losartan with AKI  - controlled on current regimen   # Chronic diastolic CHF  - pause IV fluids  # Metabolic acidosis  - start bicarbonate 650 mg BID   # Anemia CKD - hold ESA in the setting of recent NSTEMI; note on outpatient epo per nephrology - Would transfuse PRBC's if drops further - CBC in AM  # Secondary hyperparathyroidism - last renal note indicates calcitriol held due to low PTH   Claudia Desanctis 08/21/2021, 2:42 PM

## 2021-08-21 NOTE — Progress Notes (Signed)
Progress Note  Patient Name: Alison [name] Date of Encounter: 08/21/2021  [facility] Cardiologist: Rozann Lesches, MD   Subjective   Feeling okay.  Had a brief episode of chest discomfort last night and received 1 sublingual nitroglycerin.  Feeling fine this morning.  No further chest discomfort.  Daughter is at the bedside.  Inpatient Medications    Scheduled Meds:  aspirin EC  81 mg Oral Daily   clopidogrel  75 mg Oral Daily   isosorbide mononitrate  15 mg Oral Daily   metoprolol succinate  25 mg Oral Daily   mometasone-formoterol  2 puff Inhalation BID   rosuvastatin  20 mg Oral Daily   Continuous Infusions:  sodium chloride 75 mL/hr at 08/21/21 0323   azithromycin 500 mg (08/20/21 2259)   cefTRIAXone (ROCEPHIN)  IV 2 g (08/20/21 2208)   PRN Meds: acetaminophen, nitroGLYCERIN   Vital Signs    Vitals:   08/20/21 2317 08/21/21 0317 08/21/21 0700 08/21/21 0840  BP: 107/67 (!) 92/59  122/71  Pulse: 85 72  84  Resp: 16 18  19   Temp: 98 F (36.7 C) 97.9 F (36.6 C)  (!) 97.4 F (36.3 C)  TempSrc: Oral Oral  Oral  SpO2: 91% 100% 100% 98%  Weight:      Height:        Intake/Output Summary (Last 24 hours) at 08/21/2021 1006 Last data filed at 08/21/2021 3235 Gross per 24 hour  Intake --  Output 1100 ml  Net -1100 ml   Last 3 Weights 08/18/2021 07/30/2021 07/16/2021  Weight (lbs) 220 lb 220 lb 220 lb  Weight (kg) 99.791 kg 99.791 kg 99.791 kg      Telemetry    Sinus rhythm, 1 episode of atrial tachycardia noted yesterday - Personally Reviewed   Physical Exam  Alert, oriented, elderly woman in no distress GEN: No acute distress.   Neck: No JVD Cardiac: RRR, no murmurs, rubs, or gallops.  Respiratory: Clear to auscultation bilaterally. GI: Soft, nontender, non-distended  MS: No edema; No deformity. Neuro:  Nonfocal  Psych: Normal affect   Labs    High Sensitivity Troponin:   Recent Labs  Lab 08/18/21 1953 08/18/21 2116  TROPONINIHS  8,194* 8,870*     Chemistry Recent Labs  Lab 08/18/21 1953 08/19/21 0322 08/20/21 0109 08/21/21 0214  NA 135 136 136 137  K 4.3 4.1 4.4 5.2*  CL 103 104 107 107  CO2 19* 20* 16* 17*  GLUCOSE 124* 119* 143* 115*  BUN 43* 45* 56* 65*  CREATININE 2.12* 2.33* 2.80* 3.27*  CALCIUM 9.0 9.0 8.6* 8.3*  MG  --   --  1.8  --   PROT 6.8  --   --   --   ALBUMIN 2.9*  --   --   --   AST 51*  --   --   --   ALT 26  --   --   --   ALKPHOS 66  --   --   --   BILITOT 0.8  --   --   --   GFRNONAA 22* 20* 16* 13*  ANIONGAP 13 12 13 13     Lipids  Recent Labs  Lab 08/19/21 0322  CHOL 84  TRIG 53  HDL 51  LDLCALC 22  CHOLHDL 1.6    Hematology Recent Labs  Lab 08/19/21 0322 08/20/21 0109 08/21/21 0214  WBC 17.1* 13.8* 14.5*  RBC 2.85* 2.80* 2.57*  HGB 8.7* 8.5* 7.8*  HCT  27.9* 27.9* 25.6*  MCV 97.9 99.6 99.6  MCH 30.5 30.4 30.4  MCHC 31.2 30.5 30.5  RDW 14.9 15.2 15.1  PLT 139* 159 174   Thyroid  Recent Labs  Lab 08/19/21 0231  TSH 3.553    BNPNo results for input(s): BNP, PROBNP in the last 168 hours.  DDimer  Recent Labs  Lab 08/18/21 1953  DDIMER 1.64*     Radiology    ECHOCARDIOGRAM COMPLETE  Result Date: 08/19/2021    ECHOCARDIOGRAM REPORT   Patient Name:   Alison [name] Date of Exam: 08/19/2021 Medical Rec #:  [id number]           Height:       61.0 in Accession #:    [id number]          Weight:       220.0 lb Date of Birth:  1931-07-23           BSA:          1.967 m Patient Age:    18 years            BP:           90/61 mmHg Patient Gender: F                   HR:           83 bpm. Exam Location:  Inpatient Procedure: 2D Echo, Cardiac Doppler and Color Doppler Indications:    NSTEMI  History:        Patient has prior history of Echocardiogram examinations. CAD,                 COPD; Risk Factors:Hypertension.  Sonographer:    Jyl Heinz Referring Phys: [id number] [place]  1. Left ventricular ejection fraction, by estimation, is 35%. The  left ventricle has moderately decreased function. The left ventricle demonstrates regional wall motion abnormalities with basal to mid anterolateral, inferolateral, and inferior akinesis.  Left ventricular diastolic parameters are consistent with Grade I diastolic dysfunction (impaired relaxation).  2. D-shaped interventricular septum suggestive of RV pressure/volume overload. Right ventricular systolic function is moderately reduced. The right ventricular size is mildly enlarged. There is normal pulmonary artery systolic pressure. The estimated right ventricular systolic pressure is 37.4 mmHg.  3. Left atrial size was mildly dilated.  4. Right atrial size was mildly dilated.  5. The mitral valve is normal in structure. No evidence of mitral valve regurgitation. No evidence of mitral stenosis.  6. Tricuspid valve regurgitation is moderate.  7. The aortic valve is tricuspid. Aortic valve regurgitation is not visualized. No aortic stenosis is present.  8. The inferior vena cava is normal in size with <50% respiratory variability, suggesting right atrial pressure of 8 mmHg. FINDINGS  Left Ventricle: Left ventricular ejection fraction, by estimation, is 35%. The left ventricle has moderately decreased function. The left ventricle demonstrates regional wall motion abnormalities. The left ventricular internal cavity size was normal in size. There is no left ventricular hypertrophy. Left ventricular diastolic parameters are consistent with Grade I diastolic dysfunction (impaired relaxation). Right Ventricle: D-shaped interventricular septum suggestive of RV pressure/volume overload. The right ventricular size is mildly enlarged. No increase in right ventricular wall thickness. Right ventricular systolic function is moderately reduced. There is normal pulmonary artery systolic pressure. The tricuspid regurgitant velocity is 2.58 m/s, and with an assumed right atrial pressure of 8 mmHg, the estimated right ventricular  systolic pressure is 82.7 mmHg. Left Atrium:  Left atrial size was mildly dilated. Right Atrium: Right atrial size was mildly dilated. Pericardium: There is no evidence of pericardial effusion. Mitral Valve: The mitral valve is normal in structure. There is mild calcification of the mitral valve leaflet(s). Mild mitral annular calcification. No evidence of mitral valve regurgitation. No evidence of mitral valve stenosis. Tricuspid Valve: The tricuspid valve is normal in structure. Tricuspid valve regurgitation is moderate. Aortic Valve: The aortic valve is tricuspid. Aortic valve regurgitation is not visualized. No aortic stenosis is present. Aortic valve peak gradient measures 4.3 mmHg. Pulmonic Valve: The pulmonic valve was normal in structure. Pulmonic valve regurgitation is trivial. Aorta: The aortic root is normal in size and structure. Venous: The inferior vena cava is normal in size with less than 50% respiratory variability, suggesting right atrial pressure of 8 mmHg. IAS/Shunts: No atrial level shunt detected by color flow Doppler.  LEFT VENTRICLE PLAX 2D LVIDd:         4.10 cm     Diastology LVIDs:         3.30 cm     LV e' medial:    5.66 cm/s LV PW:         1.30 cm     LV E/e' medial:  13.2 LV IVS:        1.10 cm     LV e' lateral:   5.87 cm/s LVOT diam:     2.10 cm     LV E/e' lateral: 12.8 LV SV:         44 LV SV Index:   22 LVOT Area:     3.46 cm  LV Volumes (MOD) LV vol d, MOD A2C: 85.5 ml LV vol d, MOD A4C: 59.8 ml LV vol s, MOD A2C: 54.6 ml LV vol s, MOD A4C: 37.1 ml LV SV MOD A2C:     30.9 ml LV SV MOD A4C:     59.8 ml LV SV MOD BP:      26.8 ml RIGHT VENTRICLE            IVC RV Basal diam:  3.80 cm    IVC diam: 2.00 cm RV Mid diam:    2.50 cm RV S prime:     4.91 cm/s TAPSE (M-mode): 0.8 cm LEFT ATRIUM             Index        RIGHT ATRIUM           Index LA diam:        3.30 cm 1.68 cm/m   RA Area:     21.00 cm LA Vol (A2C):   76.1 ml 38.69 ml/m  RA Volume:   60.10 ml  30.56 ml/m LA Vol  (A4C):   41.3 ml 21.00 ml/m LA Biplane Vol: 59.2 ml 30.10 ml/m  AORTIC VALVE AV Area (Vmax): 2.69 cm AV Vmax:        104.00 cm/s AV Peak Grad:   4.3 mmHg LVOT Vmax:      80.80 cm/s LVOT Vmean:     56.500 cm/s LVOT VTI:       0.127 m  AORTA Ao Root diam: 3.20 cm Ao Asc diam:  3.40 cm MITRAL VALVE               TRICUSPID VALVE MV Area (PHT): 3.77 cm    TR Peak grad:   26.6 mmHg MV Decel Time: 201 msec    TR Vmax:  258.00 cm/s MV E velocity: 74.90 cm/s MV A velocity: 94.60 cm/s  SHUNTS MV E/A ratio:  0.79        Systemic VTI:  0.13 m                            Systemic Diam: 2.10 cm Dalton McleanMD Electronically signed by Franki Monte Signature Date/Time: 08/19/2021/3:53:49 PM    Final      Patient Profile     87 y.o. female with history of remote PCI, hypertension, mixed hyperlipidemia, stage 4 chronic kidney disease, prior PE status post IVC filter placement, and heart failure with reduced ejection fraction, presents with non-STEMI.  Assessment & Plan    1.  Non-STEMI: Continue medical therapy.  The patient is not a candidate for cardiac catheterization or PCI in the setting of her advanced age and kidney disease.  She is treated with aspirin, clopidogrel, isosorbide, and metoprolol succinate.  She is also on a high intensity statin drug with rosuvastatin 20 mg.  No changes recommended today. 2.  Acute on chronic kidney injury.  Unfortunately her creatinine continues to trend up to 3.27 today.  She is not on any nephrotoxic drugs.  Hemoglobin is decreased to 7.8.  By my evaluation she appears euvolemic.  Management per hospitalist team.  Not much to add today from a cardiac perspective.  She is off of IV heparin.  Medical therapy as above.  I had a lengthy discussion with the patient and her daughter about her kidney dysfunction and they understand the limitations of treatment regarding this.    For questions or updates, please contact [place] Please consult [email or web address] for  contact info under        Signed, Sherren Mocha, MD  08/21/2021, 10:06 AM

## 2021-08-22 DIAGNOSIS — N179 Acute kidney failure, unspecified: Secondary | ICD-10-CM

## 2021-08-22 DIAGNOSIS — Z7189 Other specified counseling: Secondary | ICD-10-CM

## 2021-08-22 DIAGNOSIS — N189 Chronic kidney disease, unspecified: Secondary | ICD-10-CM

## 2021-08-22 LAB — CBC
HCT: 26.1 % — ABNORMAL LOW (ref 36.0–46.0)
Hemoglobin: 8.1 g/dL — ABNORMAL LOW (ref 12.0–15.0)
MCH: 30.7 pg (ref 26.0–34.0)
MCHC: 31 g/dL (ref 30.0–36.0)
MCV: 98.9 fL (ref 80.0–100.0)
Platelets: 178 10*3/uL (ref 150–400)
RBC: 2.64 MIL/uL — ABNORMAL LOW (ref 3.87–5.11)
RDW: 15 % (ref 11.5–15.5)
WBC: 15.2 10*3/uL — ABNORMAL HIGH (ref 4.0–10.5)
nRBC: 1.3 % — ABNORMAL HIGH (ref 0.0–0.2)

## 2021-08-22 LAB — RENAL FUNCTION PANEL
Albumin: 2.5 g/dL — ABNORMAL LOW (ref 3.5–5.0)
Anion gap: 12 (ref 5–15)
BUN: 82 mg/dL — ABNORMAL HIGH (ref 8–23)
CO2: 18 mmol/L — ABNORMAL LOW (ref 22–32)
Calcium: 8.7 mg/dL — ABNORMAL LOW (ref 8.9–10.3)
Chloride: 105 mmol/L (ref 98–111)
Creatinine, Ser: 3.4 mg/dL — ABNORMAL HIGH (ref 0.44–1.00)
GFR, Estimated: 12 mL/min — ABNORMAL LOW (ref >60)
Glucose, Bld: 116 mg/dL — ABNORMAL HIGH (ref 70–99)
Phosphorus: 6.6 mg/dL — ABNORMAL HIGH (ref 2.5–4.6)
Potassium: 5.3 mmol/L — ABNORMAL HIGH (ref 3.5–5.1)
Sodium: 135 mmol/L (ref 135–145)

## 2021-08-22 LAB — MAGNESIUM: Magnesium: 2 mg/dL (ref 1.7–2.4)

## 2021-08-22 MED ORDER — SODIUM BICARBONATE 650 MG PO TABS
650.0000 mg | ORAL_TABLET | Freq: Three times a day (TID) | ORAL | Status: DC
  Administered 2021-08-22 – 2021-08-25 (×10): 650 mg via ORAL
  Filled 2021-08-22 (×10): qty 1

## 2021-08-22 NOTE — Progress Notes (Signed)
PROGRESS NOTE   Alison [name]  HYQ:[id number] DOB: 06/14/1931 DOA: 08/18/2021 PCP: [name], MD  Brief Narrative:  87 year old black female [name] status post surgery 2009, DVT pulmonary embolism 2009 with IVC filter previously on Coumadin,MI 1997 ,HTN, prior lumbar spondylolisthesis status post fusion 2017, CKD 4, HTN Came to emergency room 11/8 with radiating chest pain aggravated by deep breath-increasing fatigue-intermittent leg swelling-EKG showed sinus tach some concern for NSTEMI troponin 88,194 Rx nitroglycerin aspirin White count 20,000 dimer 1.6 CXR =?  Pneumonia Rx Rocephin azithromycin additionally  Hospitalization has been complicated by worsening kidney function  Hospital-Problem based course  Pneumonia Hypoxic respiratory failure on admission IV to levaquin 11/10 renally dosed Saline locked by renal De-escalate oxygen in the next several days-desat screen has been ordered NSTEMI Not cath candidate given advanced age and poor functional status Medical management Toprol-XL 25 and Crestor 20--not a good candidate for ACE inhibitor secondary to rising creatinine Stopped imdur 11/10 ensure adequate renal perfusion Heparin off since 11/11 Continue Plavix 75 and aspirin 81 NSVT Probably irritability from depressed EF Control as able with BB AKI probably secondary to cardiorenal syndrome-probably 1.6 is baseline Mild hyperkalemia and met acidosis Bladder scan showed only 28 cc- feNA=0.2 indicative pre-renal causes likely from poor perfusion US renal 11/11 no hydronephrosis bicarb 650 bid orally--watch K--if above 5.5 will need K binder She is clear she doesn't wish HD Defer next decision making steps to renal who have seen patient in consult 11/10--I will consult palliative care Shortness of breath VQ scan 11/9 rules out PE Adrenal mass 2009 with pulmonary embolism and IVC filter previously on Coumadin Monitor  DVT prophylaxis: Is on heparin Code  Status: DNAR Family Communication: Updated daughter [name] [phone] 08/21/21 a bedside Palliative consulted to help family/patient medical decision making as she seems to understand she does not wish dialysis but I want to make sure she understands clearly the implications of this in terms of end-of-life and if this continues to worsen  Disposition:  Status is: Inpatient  Remains inpatient appropriate because: Pneumonia and heart issues  Consultants:  Cardiology  Procedures:   Antimicrobials: Ceftriaxone azithromycin   Subjective:  Awake comfortable occasional palpitations when walking No fever no chills no nausea no vomiting States swelling in lower extremities is improved   Objective: Vitals:   08/21/21 1739 08/21/21 1942 08/21/21 2320 08/22/21 0345  BP: 107/77 (!) 103/59 (!) 90/58 108/70  Pulse: 73 71 69 72  Resp: $Remo'19 18 17 18  'fqXTk$ Temp: 97.8 F (36.6 C) 98.6 F (37 C) 98.3 F (36.8 C) 98.2 F (36.8 C)  TempSrc: Oral Oral Oral Oral  SpO2: 100% 98% 99% 100%  Weight:    101.9 kg  Height:        Intake/Output Summary (Last 24 hours) at 08/22/2021 0718 Last data filed at 08/22/2021 0409 Gross per 24 hour  Intake 1190 ml  Output 1050 ml  Net 140 ml    Filed Weights   08/18/21 2100 08/22/21 0345  Weight: 99.8 kg 101.9 kg    Examination:  EOMI NCAT thick neck Mallampati 4 S1-S2 no murmur no rub no gallop ROM intact S1-S2-occasional PVCs and tachycardia with SVT on monitors Abdomen soft no rebound no guarding Clinically clear no added sound no rales no rhonchi Neurologically intact moving 4 limbs equally  Data Reviewed: personally reviewed   CBC    Component Value Date/Time   WBC 15.2 (H) 08/22/2021 0235   RBC 2.64 (L) 08/22/2021 0235   HGB 8.1 (L)  08/22/2021 0235   HGB 10.4 (L) 11/12/2009 1016   HCT 26.1 (L) 08/22/2021 0235   HCT 31.5 (L) 11/12/2009 1016   PLT 178 08/22/2021 0235   PLT 201 11/12/2009 1016   MCV 98.9 08/22/2021 0235   MCV 92.4  11/12/2009 1016   MCH 30.7 08/22/2021 0235   MCHC 31.0 08/22/2021 0235   RDW 15.0 08/22/2021 0235   RDW 14.7 (H) 11/12/2009 1016   LYMPHSABS 1.3 08/21/2021 0214   LYMPHSABS 2.2 11/12/2009 1016   MONOABS 0.9 08/21/2021 0214   MONOABS 0.6 11/12/2009 1016   EOSABS 0.0 08/21/2021 0214   EOSABS 0.2 11/12/2009 1016   BASOSABS 0.0 08/21/2021 0214   BASOSABS 0.0 11/12/2009 1016   CMP Latest Ref Rng & Units 08/22/2021 08/21/2021 08/20/2021  Glucose 70 - 99 mg/dL 116(H) 115(H) 143(H)  BUN 8 - 23 mg/dL 82(H) 65(H) 56(H)  Creatinine 0.44 - 1.00 mg/dL 3.40(H) 3.27(H) 2.80(H)  Sodium 135 - 145 mmol/L 135 137 136  Potassium 3.5 - 5.1 mmol/L 5.3(H) 5.2(H) 4.4  Chloride 98 - 111 mmol/L 105 107 107  CO2 22 - 32 mmol/L 18(L) 17(L) 16(L)  Calcium 8.9 - 10.3 mg/dL 8.7(L) 8.3(L) 8.6(L)  Total Protein 6.5 - 8.1 g/dL - - -  Total Bilirubin 0.3 - 1.2 mg/dL - - -  Alkaline Phos 38 - 126 U/L - - -  AST 15 - 41 U/L - - -  ALT 0 - 44 U/L - - -     Radiology Studies: US RENAL  Result Date: 08/21/2021 CLINICAL DATA:  Acute kidney injury EXAM: RENAL / URINARY TRACT ULTRASOUND COMPLETE COMPARISON:  Renal ultrasound 04/18/2020 FINDINGS: Right Kidney: Renal measurements: 7.7 x 4.4 x 5.1 cm = volume: 91 mL. Right upper pole cyst 4.8 cm. Right upper pole cyst 3.5 cm. Echogenicity within normal limits. No mass or hydronephrosis visualized. Left Kidney: Renal measurements: 8.7 x 4.3 x 3.8 cm = volume: 74 mL. 2 cm left lower pole cyst. Echogenicity within normal limits. No mass or hydronephrosis visualized. Bladder: Appears normal for degree of bladder distention. Other: Suboptimal study due to body habitus. IMPRESSION: Relatively small kidneys bilaterally. Negative for hydronephrosis. Bilateral renal cysts. Electronically Signed   By: Franchot Gallo M.D.   On: 08/21/2021 14:24     Scheduled Meds:  aspirin EC  81 mg Oral Daily   atorvastatin  40 mg Oral Daily   cefdinir  300 mg Oral Daily   clopidogrel  75 mg Oral  Daily   metoprolol succinate  25 mg Oral Daily   mometasone-formoterol  2 puff Inhalation BID   sodium bicarbonate  650 mg Oral BID   Continuous Infusions:     LOS: 4 days   Time spent: [place], MD [facility] To contact the attending provider between 7A-7P or the covering provider during after hours 7P-7A, please log into the web site [email or web address] and access using universal [place] password for that web site. If you do not have the password, please call the hospital operator.  08/22/2021, 7:18 AM

## 2021-08-22 NOTE — Progress Notes (Signed)
[facility] Progress Note  Name: Alison [name] MRN: [id number] DOB: 1931-08-19  Chief Complaint:  Shortness of breath  Subjective:  note that her purewick had become dislodged and two pads soaked with urine were changed.  She had 1.1 liters UOP over 11/11 that was captured in addition to above.  Per nursing note bladder scan with negligible urine.  Her daughter is at bedside.  On outpatient discussions she has stated that she would not want dialysis and she thinks she feels this way today, as well.   Review of systems:  Reports shortness of breath with exertion Denies chest pain No n/v No dizziness  ---------------- Background on consult:  Alison [name] is a 87 y.o. female with a history of coronary artery disease, COPD, CKD stage IV, hypertension, and hx of PE/DVT who presented to the hospital with  shortness of breath.  Found to have NSTEMI.  Cardiology has recommended medical therapy and did not feel that she was a candidate for cardiac catheterization or PCI given her age and renal function.  Her creatinine trends are listed below.  Creatinine on admission 2.12 and has risen to 3.27 on the date of consult.  Nephrology is consulted for assistance with management of AKI on CKD.  She usually follows with Dr. Theador Hawthorne at [facility].  Last seen 08/06/21.   Per his last note Cr trend for 2022 is 1.7 - 2.5. The patient's CKD is attributed to HTN in his note.  She also states that "all my life I ate Goody powders" until about 15 years ago.  She took essentially daily for several years for headaches per her recollection.  She had 1.1 liters UOP over 11/10 and had one unmeasured void as well.  States was recently given a medication for swelling per cardiology - not sure of doses.   Intake/Output Summary (Last 24 hours) at 08/22/2021 0720 Last data filed at 08/22/2021 0409 Gross per 24 hour  Intake 1190 ml  Output 1050 ml  Net 140 ml    Vitals:   Vitals:   08/21/21 1739 08/21/21 1942 08/21/21 2320 08/22/21 0345  BP: 107/77 (!) 103/59 (!) 90/58 108/70  Pulse: 73 71 69 72  Resp: 19 18 17 18   Temp: 97.8 F (36.6 C) 98.6 F (37 C) 98.3 F (36.8 C) 98.2 F (36.8 C)  TempSrc: Oral Oral Oral Oral  SpO2: 100% 98% 99% 100%  Weight:    101.9 kg  Height:         Physical Exam:  General: elderly female in bed in NAD  HEENT:  NCAT Eyes: EOMI sclera anicteric Neck: supple trachea midline Heart: S1S2 no rub Lungs: clear but reduced on auscultation; unlabored at rest; on nasal cannula  Abdomen: soft/NT/ND Extremities: no edema; no cyanosis or clubbing Skin: no rash on extremities exposed Neuro: alert and oriented x 3 provides hx and follows commands Psych normal mood and affect  Medications reviewed   Labs:  BMP Latest Ref Rng & Units 08/22/2021 08/21/2021 08/20/2021  Glucose 70 - 99 mg/dL 116(H) 115(H) 143(H)  BUN 8 - 23 mg/dL 82(H) 65(H) 56(H)  Creatinine 0.44 - 1.00 mg/dL 3.40(H) 3.27(H) 2.80(H)  Sodium 135 - 145 mmol/L 135 137 136  Potassium 3.5 - 5.1 mmol/L 5.3(H) 5.2(H) 4.4  Chloride 98 - 111 mmol/L 105 107 107  CO2 22 - 32 mmol/L 18(L) 17(L) 16(L)  Calcium 8.9 - 10.3 mg/dL 8.7(L) 8.3(L) 8.6(L)     Assessment/Plan:   # Acute  kidney injury - ATN in setting of NSTEMI.  Setting of CKD 2/2 HTN and chronic NSAID's and limited renal reserve.  Renal ultrasound with smaller kidneys without hydro and post void bladder scan negligible per nursing - Continue supportive care; hopeful for plateau soon - Would hold her losartan as you are - changed to renal diet    # NSTEMI - Per cardiology she is for medical management - off of ARB with AKI    # CKD stage IV - Secondary to HTN and chronic NSAID's.   - baseline Cr 1.7-2.5 this year and follows with Dr. Theador Hawthorne   # HTN  - off of home losartan with AKI.  Avoid hypotension.  Allowing for low dose beta blocker as she is recently s/p NSTEMI - controlled on current regimen     # Chronic diastolic CHF  - pause IV fluids   # Metabolic acidosis  - continue bicarbonate - increase to 650 mg TID for now   # Anemia CKD - hold ESA in the setting of recent NSTEMI; note on outpatient epo per nephrology.  Iron is replete   # Secondary hyperparathyroidism - last renal note indicates calcitriol has been held due to low PTH   Claudia Desanctis, MD 08/22/2021 7:40 AM

## 2021-08-22 NOTE — Care Management (Addendum)
1427 08-22-21 Case Manager received a consult for Hospice @ home. Case Manager spoke with patient and daughter at the bedside. Daughter had further questions and she wanted the family present to hear the same information before the patient is set up with Hospice. Case Manager reached out to the Palliative NP and she will meet with the family on Sunday. Case Manager will follow up after the meeting to offer hospice choice.

## 2021-08-22 NOTE — Progress Notes (Signed)
SATURATION QUALIFICATIONS: (This note is used to comply with regulatory documentation for home oxygen)  Patient Saturations on Room Air at Rest = 95%  Patient Saturations on Room Air while Ambulating = <70%, extreme SOB and not able to calculate   Patient Saturations on 2 Liters of oxygen while Ambulating = 100%

## 2021-08-22 NOTE — Consult Note (Signed)
Palliative Medicine Inpatient Consult Note  Reason for consult:  GOC  HPI: Alison [name] is a 87 year old female with history of CAD, COPD, CKD stage IV, HTN, past Pes, 2 Mis in 1977 who was admitted with NSTEMI. She was not a candidate for cardiac cath or PCI She has acute on chronic kidney injury (Cr 3.4) and has declined dialysis.   Clinical Assessment/Goals of Care: I have reviewed medical records including EPIC notes, labs and imaging, received report from bedside RN Alison [name], and  assessed the patient.    I met with Alison [name]  to further discuss diagnosis prognosis, [place], EOL wishes, disposition and options. Her daughter Alison [name] was present. Alison [name] contact is [phone]. 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 Patient verbalizes her  MPOA is Alison [name], contact [phone]. Advanced Directive is on chart.   I introduced Palliative Medicine as specialized medical care for people living with serious illness. It focuses on providing relief from the symptoms and stress of a serious illness. The goal is to improve quality of life for both the patient and the family.  A detailed discussion was had today regarding advanced directives.  Concepts specific to code status, artifical feeding and hydration, continued IV antibiotics and rehospitalization was had.  The difference between a aggressive medical intervention path  and a palliative comfort care path for this patient at this time was had. Values and goals of care important to patient and family were attempted to be elicited.   Alison [name] is a widow. She has six children, 4 boys and 2 girls. She lives with her son Alison [name] and his wife in [place].  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    Patient spends much of her day in bed. Her family does help her get up. Mobility is limited by Shortness of breath.  Alison [name] states she is ready to meet  her maker. She has a DNR order on her chart and affirmed that she would like a natural death with no CPR intervention. She does not want dialysis. She had a sister who was on dialysis for 5 years and discussed the complications that can occur. She is open to hospice consult at discharge with the goal to keep her comfortable.   Her biggest concern is SOB that is worse with walking. At home she has a cane but states she is walking better her with the wheeled walker. She denies pain. She states she had headaches before admission.  Discussed the importance of continued conversation with family and their  medical providers regarding overall plan of care and treatment options, ensuring decisions are within the context of the patients values and GOCs.  Provided [employer] for [employer]" booklet.   Decision Maker: Patient, Alison [name] if patient does not have capacity to make decisions.  SUMMARY OF RECOMMENDATIONS    Code Status/Advance Care Planning:  DNAR/DNI  Symptom Management:  Headaches- acetaminophen prn Chest pain- nitroglycerin prn Dyspnea- oxygen,   Additional Recommendations (Limitations, Scope, Preferences): Hospice consult with discharge planning  Psycho-social/Spiritual:  Desire for further Chaplaincy support: Yes, christian faith, consult ot spiritual care placed. Additional Recommendations:    Prognosis: Poor, multiple comorbidities with worsening acute on chronic kidney disease and decline of dialysis treatment. The goal is to keep her comfortable.  Discharge Planning: Home when medically ready for discharge. Request Hospice consult for care after discharge  **Since all family were  not present for discussion today. Alison [name] has requested a meeting with palliative on Sunday at 3 pm. She would like to have family on speaker phone for all of the siblings to hear the conversation. We could not do this today as one son was participating in a wedding ceremony  today.  Vitals with BMI 08/22/2021 08/22/2021 08/22/2021  Height - - -  Weight - - 224 lbs 10 oz  BMI - - 57.33  Systolic - 448 301  Diastolic - 80 70  Pulse 71 80 72    Physical Exam  PPS: 40%    This conversation/these recommendations were discussed with patient primary care team, Dr. Verlon Au.  Thank you for the opportunity to participate in the care of this patient and family.    Total Time: 70 minutes Greater than 50%  of this time was spent counseling and coordinating care related to the above assessment and plan.  Alison Spar, NP [facility] Palliative Medicine Team Team Cell Phone: [phone] Please utilize secure chat with additional questions, if there is no response within 30 minutes please call the above phone number  Palliative Medicine Team providers are available by phone from 7am to 7pm daily and can be reached through the team cell phone.  Should this patient require assistance outside of these hours, please call the patient's attending physician.

## 2021-08-22 NOTE — Progress Notes (Signed)
Mobility Specialist Progress Note    08/22/21 1221  Mobility  Activity Ambulated in room  Level of Assistance Contact guard assist, steadying assist  Assistive Device Four wheel walker  Distance Ambulated (ft) 10 ft  Mobility Ambulated with assistance in room  Mobility Response Tolerated fair  Mobility performed by Mobility specialist  $Mobility charge 1 Mobility   Pt received in bed and agreeable. Pt was already experiencing some SOB by the time she was ready to stand. When we got into her doorway she needed to sit down. Her pleth was unreliable but SpO2 reading in the 40s and 50s. O2 was bumped progressively up to 6L. Nurse pushed her back to bed while seated in the rollator and she returned to 2LO2 once settled with O2 sat reading 100%.  Hildred Alamin Mobility Specialist  Mobility Specialist Phone: [phone]

## 2021-08-23 DIAGNOSIS — Z515 Encounter for palliative care: Secondary | ICD-10-CM

## 2021-08-23 LAB — RENAL FUNCTION PANEL
Albumin: 2.7 g/dL — ABNORMAL LOW (ref 3.5–5.0)
Anion gap: 13 (ref 5–15)
BUN: 102 mg/dL — ABNORMAL HIGH (ref 8–23)
CO2: 17 mmol/L — ABNORMAL LOW (ref 22–32)
Calcium: 8.6 mg/dL — ABNORMAL LOW (ref 8.9–10.3)
Chloride: 107 mmol/L (ref 98–111)
Creatinine, Ser: 3.78 mg/dL — ABNORMAL HIGH (ref 0.44–1.00)
GFR, Estimated: 11 mL/min — ABNORMAL LOW (ref >60)
Glucose, Bld: 115 mg/dL — ABNORMAL HIGH (ref 70–99)
Phosphorus: 7 mg/dL — ABNORMAL HIGH (ref 2.5–4.6)
Potassium: 4.8 mmol/L (ref 3.5–5.1)
Sodium: 137 mmol/L (ref 135–145)

## 2021-08-23 MED ORDER — GUAIFENESIN 100 MG/5ML PO LIQD
5.0000 mL | ORAL | Status: DC | PRN
  Administered 2021-08-23: 5 mL via ORAL
  Filled 2021-08-23: qty 5

## 2021-08-23 MED ORDER — GUAIFENESIN 100 MG/5ML PO LIQD
10.0000 mL | Freq: Three times a day (TID) | ORAL | Status: DC
  Administered 2021-08-23 – 2021-08-25 (×6): 10 mL via ORAL
  Filled 2021-08-23 (×6): qty 10

## 2021-08-23 NOTE — Progress Notes (Signed)
[facility] Progress Note  Name: Alison [name] MRN: [id number] DOB: 13-Nov-1930  Chief Complaint:  Shortness of breath  Subjective:  She had 250 mL uop over 11/12.  The day prior issues with capturing all output related to Bath.  On outpatient discussions she has stated that she would not want dialysis and this is consistent with what she has stated here.  Note palliative was consulted and there are discussions about possible hospice care.  States when she attempted to walk in a hall a couple of days ago was short of breath and her face was hot/flushed but no nausea this time.  Spoke with her daughter at bedside; her family is supportive of her decisions.    Review of systems:  Reports shortness of breath with exertion (even when just turning in bed for bath).  Not on oxygen at home but her daughter thinks that she needs it  Denies chest pain No n/v No dizziness  ---------------- Background on consult:  Alison [name] is a 87 y.o. female with a history of coronary artery disease, COPD, CKD stage IV, hypertension, and hx of PE/DVT who presented to the hospital with  shortness of breath.  Found to have NSTEMI.  Cardiology has recommended medical therapy and did not feel that she was a candidate for cardiac catheterization or PCI given her age and renal function.  Her creatinine trends are listed below.  Creatinine on admission 2.12 and has risen to 3.27 on the date of consult.  Nephrology is consulted for assistance with management of AKI on CKD.  She usually follows with Dr. Theador Hawthorne at [facility]  Last seen 08/06/21.   Per his last note Cr trend for 2022 is 1.7 - 2.5. The patient's CKD is attributed to HTN in his note.  She also states that "all my life I ate Goody powders" until about 15 years ago.  She took essentially daily for several years for headaches per her recollection.  She had 1.1 liters UOP over 11/10 and had one unmeasured void as  well.  States was recently given a medication for swelling per cardiology - not sure of doses.   Intake/Output Summary (Last 24 hours) at 08/23/2021 0719 Last data filed at 08/22/2021 2320 Gross per 24 hour  Intake 120 ml  Output 250 ml  Net -130 ml    Vitals:  Vitals:   08/22/21 2320 08/23/21 0128 08/23/21 0352 08/23/21 0530  BP: 107/69  108/67   Pulse: 79  67   Resp: 16  16   Temp: 97.6 F (36.4 C)  97.6 F (36.4 C)   TempSrc: Oral  Oral   SpO2: 100% 100% 100%   Weight:    101.8 kg  Height:         Physical Exam: General: elderly female in bed in NAD  HEENT:  NCAT Eyes: EOMI sclera anicteric Neck: supple trachea midline Heart: S1S2 no rub Lungs: clear but reduced on auscultation; unlabored at rest; on nasal cannula 2 liters Abdomen: soft/NT/ND Extremities: no edema; no cyanosis or clubbing Skin: no rash on extremities exposed Neuro: alert and oriented x 3 provides hx and follows commands Psych normal mood and affect  Medications reviewed   Labs:  BMP Latest Ref Rng & Units 08/22/2021 08/21/2021 08/20/2021  Glucose 70 - 99 mg/dL 116(H) 115(H) 143(H)  BUN 8 - 23 mg/dL 82(H) 65(H) 56(H)  Creatinine 0.44 - 1.00 mg/dL 3.40(H) 3.27(H) 2.80(H)  Sodium 135 - 145 mmol/L 135 137  136  Potassium 3.5 - 5.1 mmol/L 5.3(H) 5.2(H) 4.4  Chloride 98 - 111 mmol/L 105 107 107  CO2 22 - 32 mmol/L 18(L) 17(L) 16(L)  Calcium 8.9 - 10.3 mg/dL 8.7(L) 8.3(L) 8.6(L)     Assessment/Plan:   # Acute kidney injury - ATN in setting of NSTEMI.  Setting of CKD 2/2 HTN and chronic NSAID's and limited renal reserve.  Renal ultrasound with smaller kidneys without hydro and post void bladder scan negligible per nursing - Continue supportive care; hopeful for plateau soon (minimal Cr rise from 11/11 to 11/12).  She has elected not to pursue dialysis if renal failure worsens - ordered renal panel for today - stopping metoprolol given hypotension.  Had previously allowed for low dose beta blocker  as she is recently s/p NSTEMI however palliative has been consulted and plans for possible hospice.  Would obtain labs today to at least determine if she has plateaued.   - Would hold her losartan as you are - on renal diet    # NSTEMI - Per cardiology she is for medical management - off of ARB with AKI    # CKD stage IV - Secondary to HTN and chronic NSAID's.   - baseline Cr 1.7-2.5 this year and follows with Dr. Theador Hawthorne   # HTN  - off of home losartan with AKI.  Avoid hypotension.  - controlled on current regimen but BP marginal  - pause metoprolol for now   # Chronic diastolic CHF  - defer IV fluids; pause metoprolol    # Metabolic acidosis  - continue bicarbonate  # Anemia CKD - hold ESA in the setting of recent NSTEMI; note on outpatient epo per nephrology.  Iron is replete   # Secondary hyperparathyroidism - last renal note indicates calcitriol has been held due to low PTH  Disposition - would continue inpatient monitoring   Claudia Desanctis, MD 08/23/2021 7:43 AM

## 2021-08-23 NOTE — TOC Initial Note (Signed)
Transition of Care [name]) - Initial/Assessment Note    Patient Details  Name: Alison [name] MRN: [id number] Date of Birth: 1931-07-03  Transition of Care [facility]) CM/SW Contact:    Alison Roys, RN Phone Number: 08/23/2021, 4:26 PM  Clinical Narrative:  Case Manager received consult to offer choice for Alison at home. Case Manager spoke with patient and daughter and the plan is for Alison [name]. Patient needs durable medical equipment, oxygen, rolling walker,bedside commode and [name] bed (family states can be delayed as the patient does not need right away). Case Manager did call the answering service and spoke with Alison [name]. Case Manager just received a call from [place] with Intake-patient's information was provided. Alison [name] has to review the patient's notes to confirm eligibility for [facility]. Alison [name] stated that if the patient is eligible- DME will be ordered via [place] and delivered to the home. Alison [name] will reach out to [employer] @ [phone] to discuss visit times and DME delivery. Patient lives with son Alison [name] @ [phone] and he will need to be notified prior to transition home via Alison [name]. Alison [name] Intake will call the unit Case Manager on Monday to provide confirmation of acceptance and visit dates.            Expected Discharge Plan: Home w Alison Care Barriers to Discharge: Other (must enter comment) (Alison [name] to call Case Manager back for initial referral - message sent to answering service.)   Patient Goals and CMS Choice Patient states their goals for this hospitalization and ongoing recovery are:: to return home with [facility]. CMS Medicare.gov Compare Post Acute Care list provided to:: Patient Choice offered to / list presented to : Patient  Expected Discharge Plan and Services Expected Discharge Plan: Alison [name]   Discharge Planning Services: CM  Consult Post Acute Care Choice: Durable Medical Equipment, Alison Living arrangements for the past 2 months: [place]                 DME Arranged:  (Alison [name] to order DME)   Date DME [name] Contacted: 08/23/21 (left information with answering service- awaiting call back) Time DME [name] Contacted: 1624 Representative spoke with at DME [name]: Alison [name] HH Arranged: RN Alison [name]: Alison [name] Date [place]: 08/23/21 Time [place]: 1624 Representative spoke with at [place]: Alison [name] (Awaiting [place])  Prior Living Arrangements/Services Living arrangements for the past 2 months: [place] with:: Self, Adult Children (Patient lives with son Alison [name] in the home) Patient language and need for interpreter reviewed:: Yes Do you feel safe going back to the place where you live?: Yes      Need for Family Participation in Patient Care: Yes (Comment) Care giver support system in place?: Yes (comment)   Criminal Activity/Legal Involvement Pertinent to Current Situation/Hospitalization: No - Comment as needed  Activities of Daily Living Home Assistive Devices/Equipment: Environmental consultant (specify type), Cane (specify quad or straight), Shower chair without back, Bedside commode/3-in-1, Dentures (specify type) ADL Screening (condition at time of admission) Patient's cognitive ability adequate to safely complete daily activities?: Yes Is the patient deaf or have difficulty hearing?: Yes (deaf on right ear) Does the patient have difficulty seeing, even when wearing glasses/contacts?: Yes Does the patient have difficulty concentrating, remembering, or making decisions?: No Patient able to express need for assistance with ADLs?: Yes Does the patient have difficulty dressing or bathing?: Yes Independently performs ADLs?: No Communication:  Needs assistance Is this a change from baseline?: Pre-admission baseline Dressing (OT): Needs  assistance Is this a change from baseline?: Pre-admission baseline Grooming: Needs assistance Is this a change from baseline?: Pre-admission baseline Feeding: Needs assistance Is this a change from baseline?: Pre-admission baseline Bathing: Needs assistance Is this a change from baseline?: Pre-admission baseline Toileting: Needs assistance Is this a change from baseline?: Pre-admission baseline In/Out Bed: Needs assistance Is this a change from baseline?: Pre-admission baseline Walks in Home: Needs assistance Is this a change from baseline?: Pre-admission baseline Does the patient have difficulty walking or climbing stairs?: Yes Weakness of Legs: None Weakness of Arms/Hands: Both  Permission Sought/Granted Permission sought to share information with : Family Supports, Customer service manager, Case Optician, dispensing granted to share information with : Yes, Verbal Permission Granted     Permission granted to share info w [name]: Alison of [facility]        Emotional Assessment Appearance:: Appears stated age Attitude/Demeanor/Rapport: Engaged Affect (typically observed): Appropriate Orientation: : Oriented to Situation, Oriented to  Time, Oriented to Place, Oriented to Self Alcohol / Substance Use: Not Applicable Psych Involvement: No (comment)  Admission diagnosis:  Non-STEMI (non-ST elevated myocardial infarction) (Alison [name]) [I21.4] NSTEMI (non-ST elevated myocardial infarction) ([place]) [I21.4] Chronic kidney disease, unspecified CKD stage [N18.9] Community acquired pneumonia, unspecified laterality [J18.9] Patient Active Problem List   Diagnosis Date Noted   AKI (acute kidney injury) ([place])    Elevated d-dimer 08/19/2021   Leukocytosis 08/19/2021   COPD (chronic obstructive pulmonary disease) ([place])    Community acquired pneumonia    Non-STEMI (non-ST elevated myocardial infarction) ([place]) 08/18/2021   NSTEMI (non-ST elevated myocardial infarction) ([place])  08/18/2021   Chronic combined systolic and diastolic heart failure ([place]) 01/29/2020   Essential hypertension 09/14/2016   CKD (chronic kidney disease), stage IV ([place]) 09/14/2016   S/P lumbar spinal fusion 09/10/2016   Hypocalcemia 09/03/2013   Leg edema 04/06/2013   History of pulmonary embolus (PE) 01/05/2011   Anemia 06/26/2010   Hyperlipidemia 06/24/2009   CAD, NATIVE VESSEL 06/24/2009   PCP:  [name], MD Pharmacy:   [id number] [place] [place] [address] Phone: [phone] Fax: [phone]  [facility] [address] [place] [address] Phone: [phone] Fax: [phone]     Social Determinants of Health (SDOH) Interventions    Readmission Risk Interventions No flowsheet data found.

## 2021-08-23 NOTE — Progress Notes (Signed)
Palliative:  HPI: 87 year old female with history of CAD, COPD, CKD stage IV, HTN, past Pes, 2 Mis in 1977 who was admitted with NSTEMI. She was not a candidate for cardiac cath or PCI She has acute on chronic kidney injury (Cr 3.4) and has declined dialysis.    I met today with Ms. Willcox with 2 family members at bedside and multiple others over speaker phone. Ms. Rosales met with my colleague yesterday and she has very clear goals. After I introduced myself I asked Ms. Chaudhuri to share what she has been discussing with her medical team and what she has been thinking. She very promptly replied that she is ready to go to heaven and meet Jesus. She shares very strongly that she has no fear and is ready for her heavenly reward. She confirms DNR and NO dialysis. She confirms desire to return home (where she lives with her son, [name]) and we discussed having hospice care at home. We discussed goals of comfort, symptom management, and optimizing quality of life. We discussed how hospice can assist with these goals. Ms. Slagel agrees and all her family agree and respect her wishes. We discussed in detail hospice support and equipment at home. We discussed hospice agency and they agree with [facility]. Discussed prognosis of likely weeks but potentially months as she is compensating well. Discussed that hospice expertise will assist them through this journey to identify signs of progression and help them know where they stand. We discussed symptoms of swelling, shortness of breath, fatigue/weakness, and lethargy as signs of worsening kidney function. I explained that hospice has many ways to assist and provide relief from symptoms as needed. They would also prefer transportation home. They are anticipating home tomorrow and we discussed that timing depends on equipment delivery (she cannot return home without oxygen), discharge paperwork, and transport home. We discussed that hospice often  meets with them same day as discharge but will speak with family to coordinate admission time - family would like to ensure that the right people will be present to be involved in admission process.   All questions/concerns addressed. Emotional support provided.   Exam: Alert, oriented. Good spirits. No distress. Breathing regular, unlabored. Abd soft.   Plan: - Complains of thick mucous: Scheduled guaifenesin 200 mg TID.  - DNR, NO dialysis.  - Home with hospice after equipment and hospice coordinated - hopefully tomorrow.   107 min  Vinie Sill, NP Palliative Medicine Team Pager [phone] (Please see [email or web address] for schedule) Team Phone [phone]    Greater than 50%  of this time was spent counseling and coordinating care related to the above assessment and plan

## 2021-08-23 NOTE — Progress Notes (Signed)
PROGRESS NOTE   Alison [name]  YOV:[id number] DOB: 1931-08-20 DOA: 08/18/2021 PCP: [name], MD  Brief Narrative:  87 year old black female [name] status post surgery 2009, DVT pulmonary embolism 2009 with IVC filter previously on Coumadin,MI 1997 ,HTN, prior lumbar spondylolisthesis status post fusion 2017, CKD 4, HTN Came to emergency room 11/8 with radiating chest pain aggravated by deep breath-increasing fatigue-intermittent leg swelling-EKG showed sinus tach some concern for NSTEMI troponin 88,194 Rx nitroglycerin aspirin White count 20,000 dimer 1.6 CXR =?  Pneumonia Rx Rocephin azithromycin additionally  Hospitalization has been complicated by worsening kidney function She has decided on home with hospice placement and is nearing d/c  Hospital-Problem based course  Pneumonia Hypoxic respiratory failure on admission IV to cefdinir 11/10 renally dosed--stop on 11/15 Saline locked by renal De-escalate oxygen in the next several days-desat screen has been ordered NSTEMI Not cath candidate given advanced age and poor functional status Nephrology discontinued Toprol-XL 11/13 Stopped imdur 11/10 ensure adequate renal perfusion Heparin off since 11/11 Continue Plavix 75 and aspirin 81 NSVT Probably irritability from depressed EF Control as able with BB AKI probably secondary to cardiorenal syndrome-probably 1.6 is baseline Mild hyperkalemia and met acidosis feNA=0.2 indicative pre-renal causes likely from poor perfusion, bladder scan negative US renal 11/11 no hydronephrosis bicarb 650 bid orally-- Creatinine continue to worsen wouldn't recheck any further as decision is for Hospice at home appreciate palliative input Shortness of breath VQ scan 11/9 rules out PE Adrenal mass 2009 with pulmonary embolism and IVC filter previously on Coumadin Monitor  DVT prophylaxis: Is on heparin Code Status: DNAR Family Communication: Updated daughter [name] [phone]  08/23/21  All questions answered--explained process of Home Hospice referral and clarified questions from patient Disposition:  Status is: Inpatient  Remains inpatient appropriate because: Pneumonia and heart issues  Consultants:  Cardiology  Procedures:   Antimicrobials: currently cefdinir   Subjective:  Awake coherent in nad Some SOB with mild activity Still eating drinking--passing urine No cp  Objective: Vitals:   08/23/21 0128 08/23/21 0352 08/23/21 0530 08/23/21 0852  BP:  108/67    Pulse:  67    Resp:  16    Temp:  97.6 F (36.4 C)    TempSrc:  Oral    SpO2: 100% 100%  100%  Weight:   101.8 kg   Height:        Intake/Output Summary (Last 24 hours) at 08/23/2021 0901 Last data filed at 08/22/2021 2320 Gross per 24 hour  Intake 120 ml  Output 250 ml  Net -130 ml    Filed Weights   08/18/21 2100 08/22/21 0345 08/23/21 0530  Weight: 99.8 kg 101.9 kg 101.8 kg    Examination:  Eomi ncat no focal deficit Ctab no added sound Abd soft nt nd no rebound no guard Rom intact no focal deficit Power 5/5  Data Reviewed: personally reviewed   CBC    Component Value Date/Time   WBC 15.2 (H) 08/22/2021 0235   RBC 2.64 (L) 08/22/2021 0235   HGB 8.1 (L) 08/22/2021 0235   HGB 10.4 (L) 11/12/2009 1016   HCT 26.1 (L) 08/22/2021 0235   HCT 31.5 (L) 11/12/2009 1016   PLT 178 08/22/2021 0235   PLT 201 11/12/2009 1016   MCV 98.9 08/22/2021 0235   MCV 92.4 11/12/2009 1016   MCH 30.7 08/22/2021 0235   MCHC 31.0 08/22/2021 0235   RDW 15.0 08/22/2021 0235   RDW 14.7 (H) 11/12/2009 1016   LYMPHSABS 1.3 08/21/2021 0214  LYMPHSABS 2.2 11/12/2009 1016   MONOABS 0.9 08/21/2021 0214   MONOABS 0.6 11/12/2009 1016   EOSABS 0.0 08/21/2021 0214   EOSABS 0.2 11/12/2009 1016   BASOSABS 0.0 08/21/2021 0214   BASOSABS 0.0 11/12/2009 1016   CMP Latest Ref Rng & Units 08/23/2021 08/22/2021 08/21/2021  Glucose 70 - 99 mg/dL 115(H) 116(H) 115(H)  BUN 8 - 23 mg/dL 102(H)  82(H) 65(H)  Creatinine 0.44 - 1.00 mg/dL 3.78(H) 3.40(H) 3.27(H)  Sodium 135 - 145 mmol/L 137 135 137  Potassium 3.5 - 5.1 mmol/L 4.8 5.3(H) 5.2(H)  Chloride 98 - 111 mmol/L 107 105 107  CO2 22 - 32 mmol/L 17(L) 18(L) 17(L)  Calcium 8.9 - 10.3 mg/dL 8.6(L) 8.7(L) 8.3(L)  Total Protein 6.5 - 8.1 g/dL - - -  Total Bilirubin 0.3 - 1.2 mg/dL - - -  Alkaline Phos 38 - 126 U/L - - -  AST 15 - 41 U/L - - -  ALT 0 - 44 U/L - - -     Radiology Studies: US RENAL  Result Date: 08/21/2021 CLINICAL DATA:  Acute kidney injury EXAM: RENAL / URINARY TRACT ULTRASOUND COMPLETE COMPARISON:  Renal ultrasound 04/18/2020 FINDINGS: Right Kidney: Renal measurements: 7.7 x 4.4 x 5.1 cm = volume: 91 mL. Right upper pole cyst 4.8 cm. Right upper pole cyst 3.5 cm. Echogenicity within normal limits. No mass or hydronephrosis visualized. Left Kidney: Renal measurements: 8.7 x 4.3 x 3.8 cm = volume: 74 mL. 2 cm left lower pole cyst. Echogenicity within normal limits. No mass or hydronephrosis visualized. Bladder: Appears normal for degree of bladder distention. Other: Suboptimal study due to body habitus. IMPRESSION: Relatively small kidneys bilaterally. Negative for hydronephrosis. Bilateral renal cysts. Electronically Signed   By: Franchot Gallo M.D.   On: 08/21/2021 14:24     Scheduled Meds:  aspirin EC  81 mg Oral Daily   atorvastatin  40 mg Oral Daily   cefdinir  300 mg Oral Daily   clopidogrel  75 mg Oral Daily   mometasone-formoterol  2 puff Inhalation BID   sodium bicarbonate  650 mg Oral TID   Continuous Infusions:   LOS: 5 days   Time spent: 25  Nita Sells, MD [facility] To contact the attending provider between 7A-7P or the covering provider during after hours 7P-7A, please log into the web site [email or web address] and access using universal [place] password for that web site. If you do not have the password, please call the hospital operator.  08/23/2021, 9:01 AM

## 2021-08-24 ENCOUNTER — Other Ambulatory Visit (HOSPITAL_COMMUNITY): Payer: Self-pay

## 2021-08-24 LAB — CULTURE, BLOOD (ROUTINE X 2)
Culture: NO GROWTH
Culture: NO GROWTH
Special Requests: ADEQUATE
Special Requests: ADEQUATE

## 2021-08-24 MED ORDER — CEFDINIR 300 MG PO CAPS
300.0000 mg | ORAL_CAPSULE | Freq: Every day | ORAL | 0 refills | Status: AC
  Filled 2021-08-24: qty 2, 2d supply, fill #0

## 2021-08-24 MED ORDER — GUAIFENESIN 100 MG/5ML PO LIQD
10.0000 mL | Freq: Three times a day (TID) | ORAL | 0 refills | Status: AC
  Filled 2021-08-24: qty 120, 4d supply, fill #0

## 2021-08-24 NOTE — Progress Notes (Signed)
Mobility Specialist: Progress Note   08/24/21 1109  Mobility  Activity Refused mobility   Pt refused mobility stating she feels too weak to walk and is supposed to discharge today.   [facility] Zavon Hyson Mobility Specialist Mobility Specialist Phone: [phone]

## 2021-08-24 NOTE — TOC Progression Note (Signed)
Transition of Care (TOC) - Progression Note  Marvetta Gibbons RN, BSN Transitions of Care Unit 4E- RN Case Manager See Treatment Team for direct phone #    Patient Details  Name: Alison [name] MRN: [id number] Date of Birth: [birth year]  Transition of Care [facility]) CM/SW Contact  Dahlia Client, Romeo Rabon, RN Phone Number: 08/24/2021, 3:52 PM  Clinical Narrative:    Call made to [facility] to f/u on weekend referral for home hospice- spoke w/ Cassandra. Per Cassandra referral is in process. DME has not been ordered yet, Cassandra to call family to confirm DME needs and review Hospice referral.   1120- received call back from [place]- she has spoken with family- they informed her that they need time to prepare space for the DME- she is ordering DME from [place] but is not sure if it will be delivered today in time for pt to discharge today- she will update as she hears anything on timing of delivery.  Anticipate pt may be here overnight awaiting DME delivery-  GOLD DNR has been signed- plan for pt to transport via [facility] once DME - home 02 has been delivered to the home.   TOC to continue to follow and will arrange transport when we hear word from [facility] that they are ready for patient on their end.    Expected Discharge Plan: Home w Hospice Care Barriers to Discharge: Other (must enter comment) ([place] to call Case Manager back for initial referral - message sent to answering service.)  Expected Discharge Plan and Services Expected Discharge Plan: [place]   Discharge Planning Services: CM Consult Post Acute Care Choice: Durable Medical Equipment, Hospice Living arrangements for the past 2 months: Single Family Home Expected Discharge Date: 08/24/21               DME Arranged:  ([facility] to order DME)   Date DME Agency Contacted: 08/23/21 (left information with answering service- awaiting call  back) Time DME Agency Contacted: 1624 Representative spoke with at DME Agency: Shea Stakes HH Arranged: RN [facility] Agency: [place] Date [place]: 08/23/21 Time [place]: 1624 Representative spoke with at [place]: Shea Stakes (Awaiting [place])   Social Determinants of Health (SDOH) Interventions    Readmission Risk Interventions No flowsheet data found.

## 2021-08-24 NOTE — Progress Notes (Signed)
Family and patient aware that care mgt did not arrange transport for this afternoon due to no notification from [facility] that DME had been delivered.  As per pt's daughter, [name], equipment was delivered earlier today.  Everyone is aware that transport will not be scheduled until tomorrow morning.

## 2021-08-24 NOTE — Care Management Important Message (Signed)
Important Message  Patient Details  Name: Alison [name] MRN: [id number] Date of Birth: 1931-03-12   Medicare Important Message Given:  Yes     [name] 08/24/2021, 10:44 AM

## 2021-08-24 NOTE — Progress Notes (Signed)
This chaplain responded to PMT consult for Pt. prayer. The Pt. is alert and welcomes the chaplain visit.  The chaplain understands the Pt. faith is her companion on this EOL medical journey and is providing a place of joy in her witness.    The Pt accepted the chaplain's invitation for prayer as she waits for d/c home with Hospice.  Chaplain Sallyanne Kuster [phone]

## 2021-08-24 NOTE — Progress Notes (Signed)
Palliative care assistance appreciated.  Pt is to go home with hospice today.  Nothing further to add.  Will sign off.

## 2021-08-24 NOTE — Discharge Summary (Signed)
Physician Discharge Summary  Alison [name] CHY:[id number] DOB: 1931/05/07 DOA: 08/18/2021  PCP: [name], MD  Admit date: 08/18/2021 Discharge date: 08/24/2021  Time spent: 26 minutes  Recommendations for Outpatient Follow-up:  Discharging home for EOL care with poor overall prognosis  Discharge Diagnoses:  MAIN problem for hospitalization   Pneumonia New heart failure  Progressive kidney failure  Please see below for itemized issues addressed in [facility]- refer to other progress notes for clarity if needed  Discharge Condition: guarded  Diet recommendation: comfort  Filed Weights   08/22/21 0345 08/23/21 0530 08/24/21 0533  Weight: 101.9 kg 101.8 kg 102.7 kg    History of present illness:  87 year old black female [name] status post surgery 2009, DVT pulmonary embolism 2009 with IVC filter previously on Coumadin,MI 1997 ,HTN, prior lumbar spondylolisthesis status post fusion 2017, CKD 4, HTN Came to emergency room 11/8 with radiating chest pain aggravated by deep breath-increasing fatigue-intermittent leg swelling-EKG showed sinus tach some concern for NSTEMI troponin 88,194 Rx nitroglycerin aspirin White count 20,000 dimer 1.6 CXR =?  Pneumonia Rx Rocephin azithromycin additionally   Hospitalization has been complicated by worsening kidney function She has decided on home with hospice placement and is nearing d/c  Hospital Course:  Pneumonia Hypoxic respiratory failure on admission Complete 1 more day abx--goals are now comfort NSTEMI Not cath candidate given advanced age and poor functional status Stopped all GDMT except lasix for comfort NSVT Probably irritability from depressed EF AKI probably secondary to cardiorenal syndrome-probably 1.6 is baseline Mild hyperkalemia and met acidosis feNA=0.2 indicative pre-renal causes likely from poor perfusion, bladder scan negative US renal 11/11 no hydronephrosis bicarb 650 bid started but eventually  d/c Creatinine continue to worsen wouldn't recheck any further as decision is for Hospice at home appreciate palliative input Shortness of breath VQ scan 11/9 rules out PE Adrenal mass 2009 with pulmonary embolism and IVC filter previously on Coumadin No further work-up /monitoring given goals are palliative   Consultations: Cardiology Palliative care renal  Discharge Exam: Vitals:   08/24/21 0801 08/24/21 0839  BP: (!) 102/48   Pulse: 70   Resp: 15   Temp: 97.6 F (36.4 C)   SpO2: 100% 100%    Subj on day of d/c   Pleasant  Awake coherent asks " when will I pass"   General Exam on discharge  Awake coherent in nad no focal deficit--wearing oxygen Cta b no added sound no rales no rhonchi Abd soft nt nd no rebound no guard Neuro  intact coherent  Discharge Instructions   Discharge Instructions     Diet - low sodium heart healthy   Complete by: As directed    Increase activity slowly   Complete by: As directed       Allergies as of 08/24/2021   No Known Allergies      Medication List     STOP taking these medications    Advair Diskus 250-50 MCG/ACT Aepb Generic drug: fluticasone-salmeterol   allopurinol 100 MG tablet Commonly known as: ZYLOPRIM   calcitRIOL 0.25 MCG capsule Commonly known as: ROCALTROL   CALCIUM 600+D3 PO   clopidogrel 75 MG tablet Commonly known as: PLAVIX   ferrous sulfate 325 (65 FE) MG tablet   isosorbide mononitrate 30 MG 24 hr tablet Commonly known as: IMDUR   losartan 50 MG tablet Commonly known as: COZAAR   metoprolol succinate 25 MG 24 hr tablet Commonly known as: TOPROL-XL   nitroGLYCERIN 0.4 MG SL tablet Commonly known as: NITROSTAT  potassium chloride 10 MEQ tablet Commonly known as: KLOR-CON   Retacrit 3000 UNIT/ML injection Generic drug: epoetin alfa-epbx   rosuvastatin 10 MG tablet Commonly known as: CRESTOR       TAKE these medications    acetaminophen 650 MG CR tablet Commonly known  as: TYLENOL Take 650 mg by mouth 2 (two) times daily.   albuterol 108 (90 Base) MCG/ACT inhaler Commonly known as: VENTOLIN HFA Inhale 2 puffs into the lungs every 6 (six) hours as needed for wheezing.   cefdinir 300 MG capsule Commonly known as: OMNICEF Take 1 capsule (300 mg total) by mouth daily. Start taking on: August 25, 2021   furosemide 20 MG tablet Commonly known as: LASIX TAKE 1 TABLET BY MOUTH  TWICE DAILY MAY TAKE 1  EXTRA TABLET DAILY AS  NEEDED FOR SWELLING OR  SHORTNESS OF BREATH What changed: See the new instructions.   guaiFENesin 100 MG/5ML liquid Commonly known as: ROBITUSSIN Take 10 mLs by mouth 3 (three) times daily.               Durable Medical Equipment  (From admission, onward)           Start     Ordered   08/24/21 1003  DME Oxygen  Once       Question Answer Comment  Length of Need Lifetime   Mode or (Route) Nasal cannula   Liters per Minute 3   Oxygen delivery system Gas      08/24/21 1003   08/24/21 1003  DME 3-in-1  Once        08/24/21 1003   08/24/21 1002  DME Walker  Once       Question Answer Comment  Walker: With 5 Inch Wheels   Patient needs a walker to treat with the following condition PNA (pneumonia)      08/24/21 1003           No Known Allergies    The results of significant diagnostics from this hospitalization (including imaging, microbiology, ancillary and laboratory) are listed below for reference.    Significant Diagnostic Studies: NM Pulmonary Perfusion  Result Date: 08/19/2021 CLINICAL DATA:  PE suspected, intermediate probability, positive D-dimer EXAM: NUCLEAR MEDICINE PERFUSION LUNG SCAN TECHNIQUE: Perfusion images were obtained in multiple projections after intravenous injection of radiopharmaceutical. Ventilation scans intentionally deferred if perfusion scan and chest x-ray adequate for interpretation during COVID 19 epidemic. RADIOPHARMACEUTICALS:  4.2 mCi Tc-61mMAA IV COMPARISON:  Chest  radiograph, 08/18/2021 FINDINGS: Homogeneous pulmonary perfusion bilaterally without suspicious perfusion defect. Cardiomegaly. IMPRESSION: 1. Very low probability for pulmonary embolism by modified perfusion only PIOPED criteria) PE absent). 2.  Cardiomegaly. Electronically Signed   By: ADelanna AhmadiM.D.   On: 08/19/2021 08:13   [place]  Result Date: 08/21/2021 CLINICAL DATA:  Acute kidney injury EXAM: RENAL / URINARY TRACT ULTRASOUND COMPLETE COMPARISON:  Renal ultrasound 04/18/2020 FINDINGS: Right Kidney: Renal measurements: 7.7 x 4.4 x 5.1 cm = volume: 91 mL. Right upper pole cyst 4.8 cm. Right upper pole cyst 3.5 cm. Echogenicity within normal limits. No mass or hydronephrosis visualized. Left Kidney: Renal measurements: 8.7 x 4.3 x 3.8 cm = volume: 74 mL. 2 cm left lower pole cyst. Echogenicity within normal limits. No mass or hydronephrosis visualized. Bladder: Appears normal for degree of bladder distention. Other: Suboptimal study due to body habitus. IMPRESSION: Relatively small kidneys bilaterally. Negative for hydronephrosis. Bilateral renal cysts. Electronically Signed   By: CFranchot GalloM.D.   On: 08/21/2021  14:24   DG Chest Portable 1 View  Result Date: 08/18/2021 CLINICAL DATA:  Chest pain. EXAM: PORTABLE CHEST 1 VIEW COMPARISON:  08/29/2016. FINDINGS: The heart is enlarged. The mediastinal structures are stable. Atherosclerotic calcification of the aorta is noted. Patchy airspace disease is noted in the mid to lower lung field on the left and at the right lung base. There is no effusion or pneumothorax. No acute osseous abnormality. IMPRESSION: 1. Cardiomegaly. 2. Patchy airspace disease in the mid to lower lung field on the left at the right lung base, possible atelectasis or infiltrate. Electronically Signed   By: Brett Fairy M.D.   On: 08/18/2021 20:00   ECHOCARDIOGRAM COMPLETE  Result Date: 08/19/2021    ECHOCARDIOGRAM REPORT   Patient Name:   TENEE WISH Date of Exam:  08/19/2021 Medical Rec #:  [id number]           Height:       61.0 in Accession #:    [id number]          Weight:       220.0 lb Date of Birth:  July 11, 1931           BSA:          1.967 m Patient Age:    11 years            BP:           90/61 mmHg Patient Gender: F                   HR:           83 bpm. Exam Location:  Inpatient Procedure: 2D Echo, Cardiac Doppler and Color Doppler Indications:    NSTEMI  History:        Patient has prior history of Echocardiogram examinations. CAD,                 COPD; Risk Factors:Hypertension.  Sonographer:    Jyl Heinz Referring Phys: [id number] [place]  1. Left ventricular ejection fraction, by estimation, is 35%. The left ventricle has moderately decreased function. The left ventricle demonstrates regional wall motion abnormalities with basal to mid anterolateral, inferolateral, and inferior akinesis.  Left ventricular diastolic parameters are consistent with Grade I diastolic dysfunction (impaired relaxation).  2. D-shaped interventricular septum suggestive of RV pressure/volume overload. Right ventricular systolic function is moderately reduced. The right ventricular size is mildly enlarged. There is normal pulmonary artery systolic pressure. The estimated right ventricular systolic pressure is 68.3 mmHg.  3. Left atrial size was mildly dilated.  4. Right atrial size was mildly dilated.  5. The mitral valve is normal in structure. No evidence of mitral valve regurgitation. No evidence of mitral stenosis.  6. Tricuspid valve regurgitation is moderate.  7. The aortic valve is tricuspid. Aortic valve regurgitation is not visualized. No aortic stenosis is present.  8. The inferior vena cava is normal in size with <50% respiratory variability, suggesting right atrial pressure of 8 mmHg. FINDINGS  Left Ventricle: Left ventricular ejection fraction, by estimation, is 35%. The left ventricle has moderately decreased function. The left ventricle demonstrates  regional wall motion abnormalities. The left ventricular internal cavity size was normal in size. There is no left ventricular hypertrophy. Left ventricular diastolic parameters are consistent with Grade I diastolic dysfunction (impaired relaxation). Right Ventricle: D-shaped interventricular septum suggestive of RV pressure/volume overload. The right ventricular size is mildly enlarged. No increase in right ventricular wall thickness. Right ventricular  systolic function is moderately reduced. There is normal pulmonary artery systolic pressure. The tricuspid regurgitant velocity is 2.58 m/s, and with an assumed right atrial pressure of 8 mmHg, the estimated right ventricular systolic pressure is 94.7 mmHg. Left Atrium: Left atrial size was mildly dilated. Right Atrium: Right atrial size was mildly dilated. Pericardium: There is no evidence of pericardial effusion. Mitral Valve: The mitral valve is normal in structure. There is mild calcification of the mitral valve leaflet(s). Mild mitral annular calcification. No evidence of mitral valve regurgitation. No evidence of mitral valve stenosis. Tricuspid Valve: The tricuspid valve is normal in structure. Tricuspid valve regurgitation is moderate. Aortic Valve: The aortic valve is tricuspid. Aortic valve regurgitation is not visualized. No aortic stenosis is present. Aortic valve peak gradient measures 4.3 mmHg. Pulmonic Valve: The pulmonic valve was normal in structure. Pulmonic valve regurgitation is trivial. Aorta: The aortic root is normal in size and structure. Venous: The inferior vena cava is normal in size with less than 50% respiratory variability, suggesting right atrial pressure of 8 mmHg. IAS/Shunts: No atrial level shunt detected by color flow Doppler.  LEFT VENTRICLE PLAX 2D LVIDd:         4.10 cm     Diastology LVIDs:         3.30 cm     LV e' medial:    5.66 cm/s LV PW:         1.30 cm     LV E/e' medial:  13.2 LV IVS:        1.10 cm     LV e' lateral:    5.87 cm/s LVOT diam:     2.10 cm     LV E/e' lateral: 12.8 LV SV:         44 LV SV Index:   22 LVOT Area:     3.46 cm  LV Volumes (MOD) LV vol d, MOD A2C: 85.5 ml LV vol d, MOD A4C: 59.8 ml LV vol s, MOD A2C: 54.6 ml LV vol s, MOD A4C: 37.1 ml LV SV MOD A2C:     30.9 ml LV SV MOD A4C:     59.8 ml LV SV MOD BP:      26.8 ml RIGHT VENTRICLE            IVC RV Basal diam:  3.80 cm    IVC diam: 2.00 cm RV Mid diam:    2.50 cm RV S prime:     4.91 cm/s TAPSE (M-mode): 0.8 cm LEFT ATRIUM             Index        RIGHT ATRIUM           Index LA diam:        3.30 cm 1.68 cm/m   RA Area:     21.00 cm LA Vol (A2C):   76.1 ml 38.69 ml/m  RA Volume:   60.10 ml  30.56 ml/m LA Vol (A4C):   41.3 ml 21.00 ml/m LA Biplane Vol: 59.2 ml 30.10 ml/m  AORTIC VALVE AV Area (Vmax): 2.69 cm AV Vmax:        104.00 cm/s AV Peak Grad:   4.3 mmHg LVOT Vmax:      80.80 cm/s LVOT Vmean:     56.500 cm/s LVOT VTI:       0.127 m  AORTA Ao Root diam: 3.20 cm Ao Asc diam:  3.40 cm MITRAL VALVE  TRICUSPID VALVE MV Area (PHT): 3.77 cm    TR Peak grad:   26.6 mmHg MV Decel Time: 201 msec    TR Vmax:        258.00 cm/s MV E velocity: 74.90 cm/s MV A velocity: 94.60 cm/s  SHUNTS MV E/A ratio:  0.79        Systemic VTI:  0.13 m                            Systemic Diam: 2.10 cm Dalton McleanMD Electronically signed by Franki Monte Signature Date/Time: 08/19/2021/3:53:49 PM    Final     Microbiology: Recent Results (from the past 240 hour(s))  Resp Panel by RT-PCR (Flu A&B, Covid) Nasopharyngeal Swab     Status: None   Collection Time: 08/18/21  7:16 PM   Specimen: Nasopharyngeal Swab; Nasopharyngeal(NP) swabs in vial transport medium  Result Value Ref Range Status   SARS Coronavirus 2 by RT PCR NEGATIVE NEGATIVE Final    Comment: (NOTE) SARS-CoV-2 target nucleic acids are NOT DETECTED.  The SARS-CoV-2 RNA is generally detectable in upper respiratory specimens during the acute phase of infection. The lowest concentration  of SARS-CoV-2 viral copies this assay can detect is 138 copies/mL. A negative result does not preclude SARS-Cov-2 infection and should not be used as the sole basis for treatment or other patient management decisions. A negative result may occur with  improper specimen collection/handling, submission of specimen other than nasopharyngeal swab, presence of viral mutation(s) within the areas targeted by this assay, and inadequate number of viral copies(<138 copies/mL). A negative result must be combined with clinical observations, patient history, and epidemiological information. The expected result is Negative.  Fact Sheet for Patients:  EntrepreneurPulse.com.au  Fact Sheet for Healthcare Providers:  IncredibleEmployment.be  This test is no t yet approved or cleared by the [place] FDA and  has been authorized for detection and/or diagnosis of SARS-CoV-2 by FDA under an Emergency Use Authorization (EUA). This EUA will remain  in effect (meaning this test can be used) for the duration of the COVID-19 declaration under Section 564(b)(1) of the Act, 21 U.S.C.section 360bbb-3(b)(1), unless the authorization is terminated  or revoked sooner.       Influenza A by PCR NEGATIVE NEGATIVE Final   Influenza B by PCR NEGATIVE NEGATIVE Final    Comment: (NOTE) The Xpert Xpress SARS-CoV-2/FLU/RSV plus assay is intended as an aid in the diagnosis of influenza from Nasopharyngeal swab specimens and should not be used as a sole basis for treatment. Nasal washings and aspirates are unacceptable for Xpert Xpress SARS-CoV-2/FLU/RSV testing.  Fact Sheet for Patients: EntrepreneurPulse.com.au  Fact Sheet for Healthcare Providers: IncredibleEmployment.be  This test is not yet approved or cleared by the [place] FDA and has been authorized for detection and/or diagnosis of SARS-CoV-2 by FDA under an Emergency Use  Authorization (EUA). This EUA will remain in effect (meaning this test can be used) for the duration of the COVID-19 declaration under Section 564(b)(1) of the Act, 21 U.S.C. section 360bbb-3(b)(1), unless the authorization is terminated or revoked.  Performed at [facility], [place] [address] [place], [place] [address]   Culture, blood (routine x 2)     Status: None   Collection Time: 08/19/21  3:22 AM   Specimen: BLOOD  Result Value Ref Range Status   Specimen Description BLOOD LEFT ARM  Final   Special Requests   Final    BOTTLES  DRAWN AEROBIC ONLY Blood Culture adequate volume   Culture   Final    NO GROWTH 5 DAYS Performed at [facility], [place] [address], [place], [place] [address]    Report Status 08/24/2021 FINAL  Final  Culture, blood (routine x 2)     Status: None   Collection Time: 08/19/21  3:28 AM   Specimen: BLOOD  Result Value Ref Range Status   Specimen Description BLOOD LEFT FOREARM  Final   Special Requests   Final    BOTTLES DRAWN AEROBIC ONLY Blood Culture adequate volume   Culture   Final    NO GROWTH 5 DAYS Performed at [facility], [place] [address], [place], [place] [address]    Report Status 08/24/2021 FINAL  Final     Labs: Basic Metabolic Panel: Recent Labs  Lab 08/19/21 0322 08/20/21 0109 08/21/21 0214 08/22/21 0235 08/22/21 0800 08/23/21 0744  NA 136 136 137 135  --  137  K 4.1 4.4 5.2* 5.3*  --  4.8  CL 104 107 107 105  --  107  CO2 20* 16* 17* 18*  --  17*  GLUCOSE 119* 143* 115* 116*  --  115*  BUN 45* 56* 65* 82*  --  102*  CREATININE 2.33* 2.80* 3.27* 3.40*  --  3.78*  CALCIUM 9.0 8.6* 8.3* 8.7*  --  8.6*  MG  --  1.8  --   --  2.0  --   PHOS  --  5.3*  --  6.6*  --  7.0*   Liver Function Tests: Recent Labs  Lab 08/18/21 1953 08/22/21 0235 08/23/21 0744  AST 51*  --   --   ALT 26  --   --   ALKPHOS 66  --   --   BILITOT 0.8  --   --   PROT 6.8  --   --   ALBUMIN 2.9* 2.5* 2.7*   Recent Labs  Lab  08/18/21 1953  LIPASE 30   No results for input(s): AMMONIA in the last 168 hours. CBC: Recent Labs  Lab 08/18/21 1953 08/19/21 0322 08/20/21 0109 08/21/21 0214 08/22/21 0235  WBC 20.2* 17.1* 13.8* 14.5* 15.2*  NEUTROABS  --  13.4*  --  11.9*  --   HGB 9.0* 8.7* 8.5* 7.8* 8.1*  HCT 28.5* 27.9* 27.9* 25.6* 26.1*  MCV 97.3 97.9 99.6 99.6 98.9  PLT 172 139* 159 174 178   Cardiac Enzymes: No results for input(s): CKTOTAL, CKMB, CKMBINDEX, TROPONINI in the last 168 hours. BNP: BNP (last 3 results) No results for input(s): BNP in the last 8760 hours.  ProBNP (last 3 results) No results for input(s): PROBNP in the last 8760 hours.  CBG: Recent Labs  Lab 08/18/21 1920  GLUCAP 119*       Signed:  Nita Sells MD   [facility] 08/24/2021, 10:03 AM

## 2021-08-25 ENCOUNTER — Encounter (HOSPITAL_COMMUNITY): Payer: Self-pay | Admitting: Family Medicine

## 2021-08-25 ENCOUNTER — Other Ambulatory Visit (HOSPITAL_COMMUNITY): Payer: Self-pay

## 2021-08-25 NOTE — Plan of Care (Signed)
  Problem: Clinical Measurements: Goal: Will remain free from infection Outcome: Progressing Goal: Diagnostic test results will improve Outcome: Progressing Goal: Respiratory complications will improve Outcome: Progressing Goal: Cardiovascular complication will be avoided Outcome: Progressing   

## 2021-08-25 NOTE — TOC Transition Note (Signed)
Transition of Care (TOC) - CM/SW Discharge Note Marvetta Gibbons RN, BSN Transitions of Care Unit 4E- RN Case Manager See Treatment Team for direct phone #    Patient Details  Name: Alison [name] MRN: [id number] Date of Birth: 07/14/1931  Transition of Care [facility]) CM/SW Contact:  Dawayne Patricia, RN Phone Number: 08/25/2021, 11:21 AM   Clinical Narrative:    Pt remains stable for transition home with Hospice services in place. Have spoken with daughter Alison [name] this am and confirmed that DME has been delivered to the home including hospital bed and home 02. Family is at the home and prepared for patient's arrival today. Address confirmed with [place].   Call also made to [place] left for [place] that DME is in the home and pt ready for transport home. [facility] to be called to set up transport.   Call made to [facility] to schedule transport home- per [facility] there are about 4 patients ahead to transport - they have indicated they will be here around 1pm for transport. Bedside RN updated and paperwork along with GOLD DNR placed on chart.    Final next level of care: Home w Hospice Care Barriers to Discharge: Barriers Resolved   Patient Goals and CMS Choice Patient states their goals for this hospitalization and ongoing recovery are:: to return home with Hospice Services. CMS Medicare.gov Compare Post Acute Care list provided to:: Patient Choice offered to / list presented to : Patient  Discharge Placement               Home w/ Hospice.         Discharge Plan and Services   Discharge Planning Services: CM Consult Post Acute Care Choice: Durable Medical Equipment, Hospice          DME Arranged:  ([facility] to order DME) DME Agency: [facility] Date DME Agency Contacted: 08/23/21 (left information with answering service- awaiting call back) Time DME Agency Contacted: [place] Representative spoke with at DME Agency: [facility]  Arranged: RN [place] Date [place]: 08/23/21 Time [place]: 1624 Representative spoke with at [place]: [facility] (Awaiting Confrimation)  Social Determinants of Health (SDOH) Interventions     Readmission Risk Interventions Readmission Risk Prevention Plan 08/25/2021  Transportation Screening Complete  PCP or Specialist Appt within 5-7 Days Complete  Home Care Screening Complete  Medication Review (RN CM) Complete  Some recent data might be hidden

## 2021-08-25 NOTE — Progress Notes (Signed)
Patient seen and examined no acute changes to care plan. Expect can discharge home with hospice today once transport is arranged   Patient is stable at this time after my review for transport  Verneita Griffes, MD [facility] 8:41 AM

## 2021-08-27 ENCOUNTER — Encounter (HOSPITAL_COMMUNITY): Admission: RE | Admit: 2021-08-27 | Payer: Medicare Other | Source: Ambulatory Visit

## 2021-10-22 ENCOUNTER — Other Ambulatory Visit: Payer: Self-pay | Admitting: Cardiology

## 2021-10-23 NOTE — Telephone Encounter (Signed)
Hospitalist stopped all 5 cardiac medications at discharge. Pharmacy requesting refills. Please advise if she is to continue these medications.

## 2021-10-26 ENCOUNTER — Telehealth: Payer: Self-pay

## 2021-10-26 MED ORDER — FUROSEMIDE 20 MG PO TABS: 20.0000 mg | ORAL_TABLET | ORAL | 1 refills | Status: AC

## 2021-10-26 NOTE — Telephone Encounter (Signed)
Medication refill request for Furosemide 20 mg tablets approved and sent to [facility] per pt request.

## 2021-11-13 ENCOUNTER — Other Ambulatory Visit: Payer: Self-pay | Admitting: Cardiology

## 2021-12-28 ENCOUNTER — Ambulatory Visit: Payer: Medicare Other | Admitting: Cardiology

## 2022-10-02 IMAGING — NM NM PULMONARY PERF PARTICULATE
8 series · 8 of 8 positions shown · non-contrast
Comparison: Chest radiograph, 08/18/2021

CLINICAL DATA: PE suspected, intermediate probability, positive
D-dimer

EXAM:
NUCLEAR MEDICINE PERFUSION LUNG SCAN
TECHNIQUE: Perfusion images were obtained in multiple projections after
intravenous injection of radiopharmaceutical.
Ventilation scans intentionally deferred if perfusion scan and chest
x-ray adequate for interpretation during COVID 19 epidemic.
RADIOPHARMACEUTICALS:  4.2 mCi Hc-XXm MAA IV

[Series 1: ant/post perf · 4.14mm/px · 1 of 1 slices shown (1 of 2)]
[im 1/1]
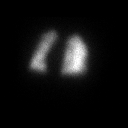

[Series 1: ant/post perf · 4.14mm/px · 1 of 1 slices shown (2 of 2)]
[im 1/1]
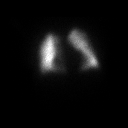

[Series 2: lao/rpo perf · 4.14mm/px · 1 of 1 slices shown (1 of 2)]
[im 1/1]
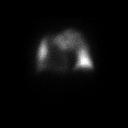

[Series 2: lao/rpo perf · 4.14mm/px · 1 of 1 slices shown (2 of 2)]
[im 1/1]
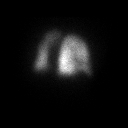

[Series 3: lpo/rao perf · 4.14mm/px · 1 of 1 slices shown (1 of 2)]
[im 1/1]
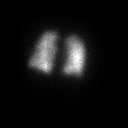

[Series 3: lpo/rao perf · 4.14mm/px · 1 of 1 slices shown (2 of 2)]
[im 1/1]
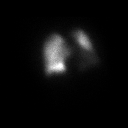

[Series 4: lt lat/rt lat perf · 4.14mm/px · 1 of 1 slices shown (1 of 2)]
[im 1/1]
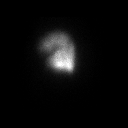

[Series 4: lt lat/rt lat perf · 4.14mm/px · 1 of 1 slices shown (2 of 2)]
[im 1/1]
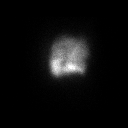

[8 of 8 positions shown; findings below may reference images not displayed]

FINDINGS: Homogeneous pulmonary perfusion bilaterally without suspicious
perfusion defect.

Cardiomegaly.
IMPRESSION: 1. Very low probability for pulmonary embolism by modified perfusion
only PIOPED criteria) PE absent).

2.  Cardiomegaly.

## 2022-10-04 IMAGING — US US RENAL
1 series · 14 of 25 positions shown · non-contrast
Comparison: Renal ultrasound 04/18/2020

CLINICAL DATA: Acute kidney injury

EXAM:
RENAL / URINARY TRACT ULTRASOUND COMPLETE

[Series 1: us renal · 14 of 47 slices shown]
[im 1/47]
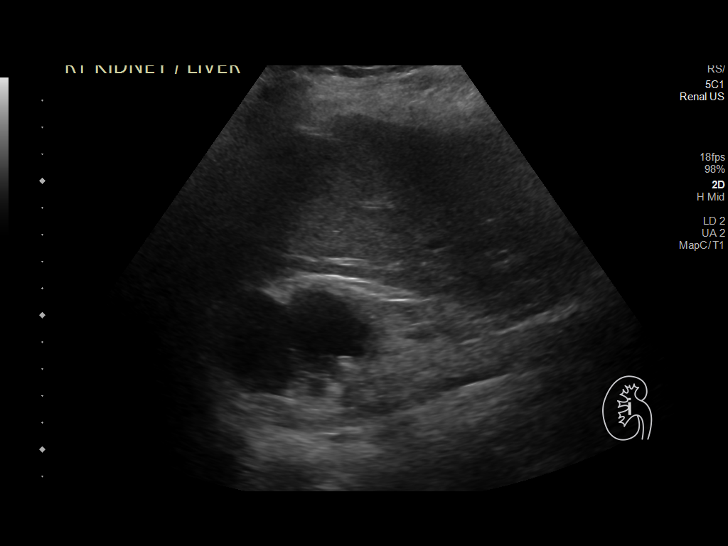
[im 4/47]
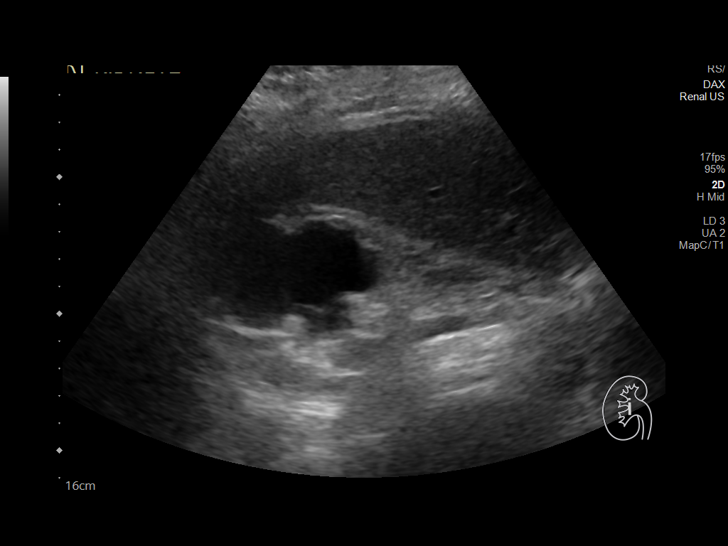
[im 8/47]
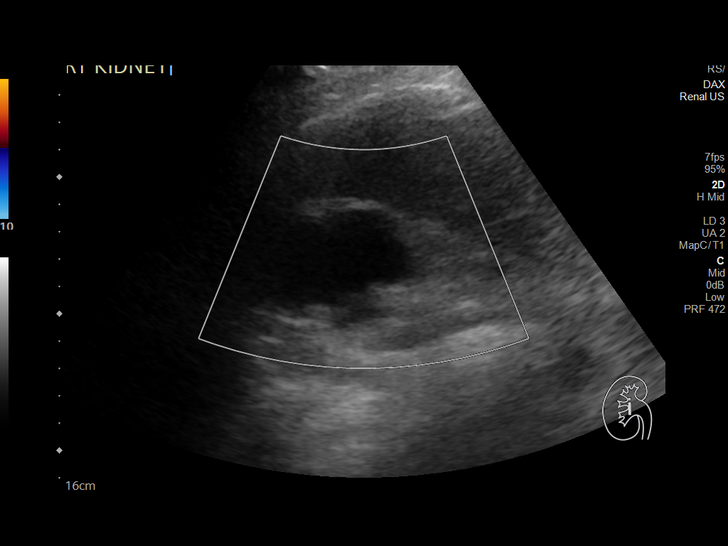
[im 12/47]
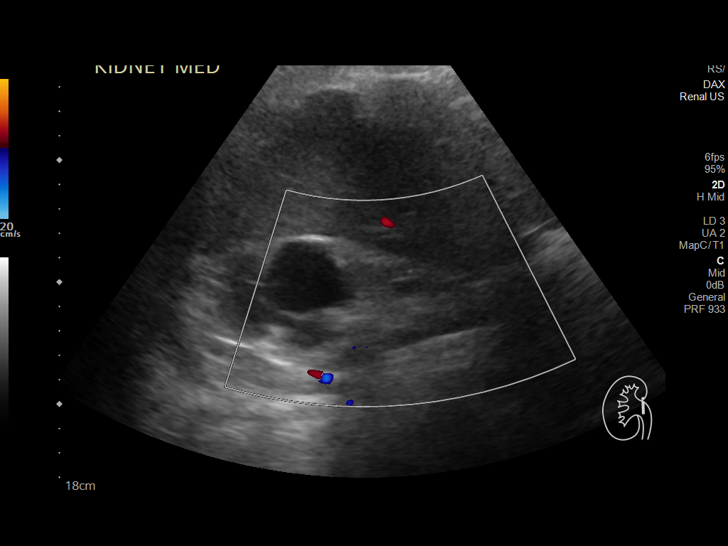
[im 16/47]
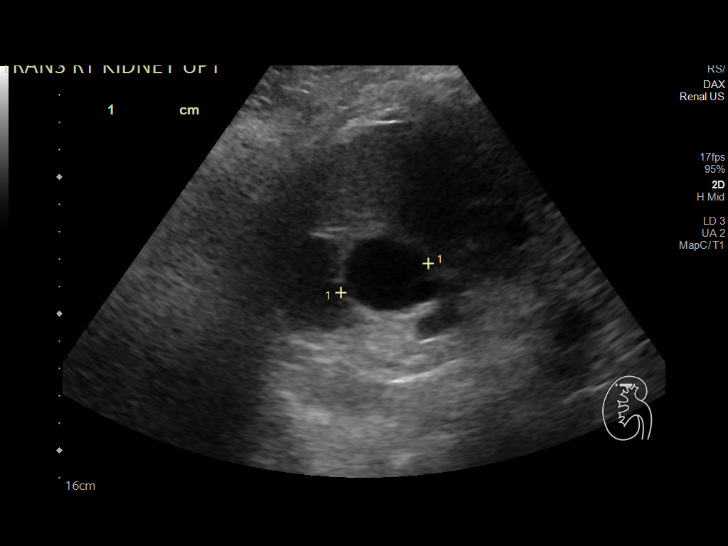
[im 18/47]
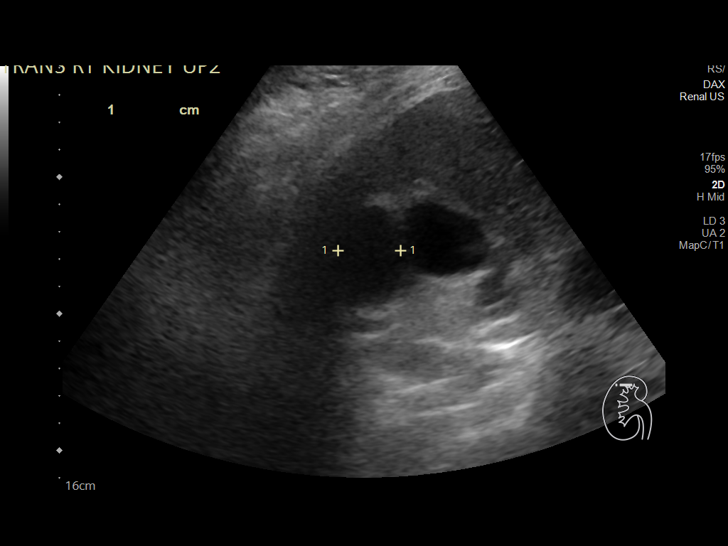
[im 22/47]
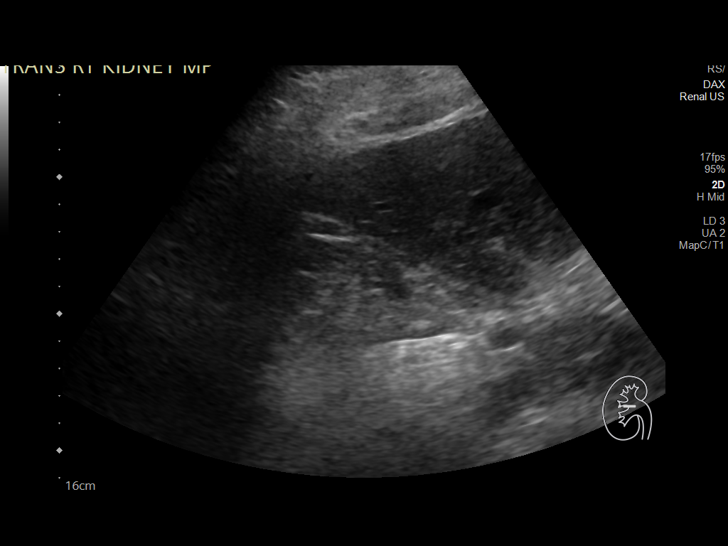
[im 25/47]
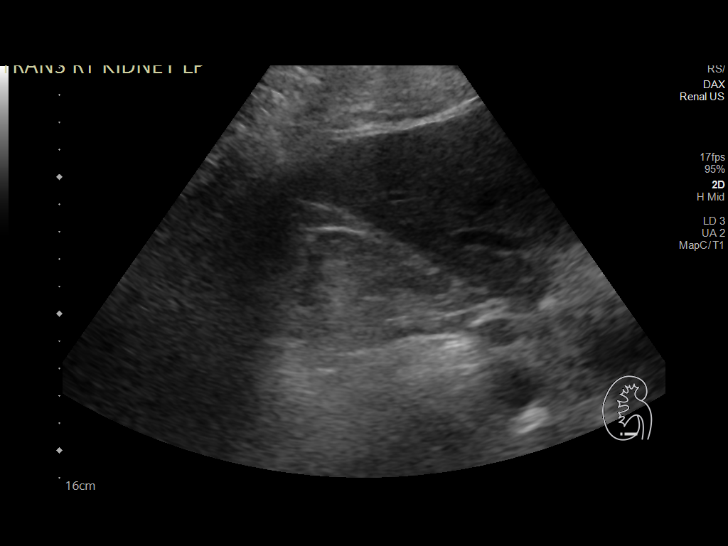
[im 29/47]
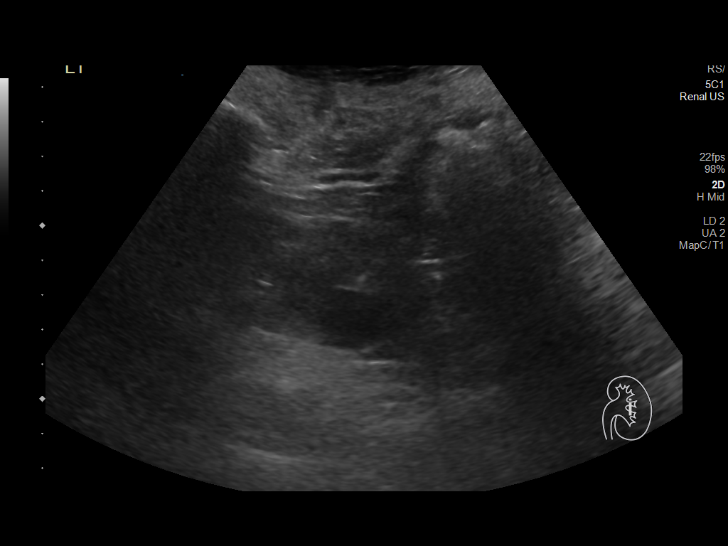
[im 31/47]
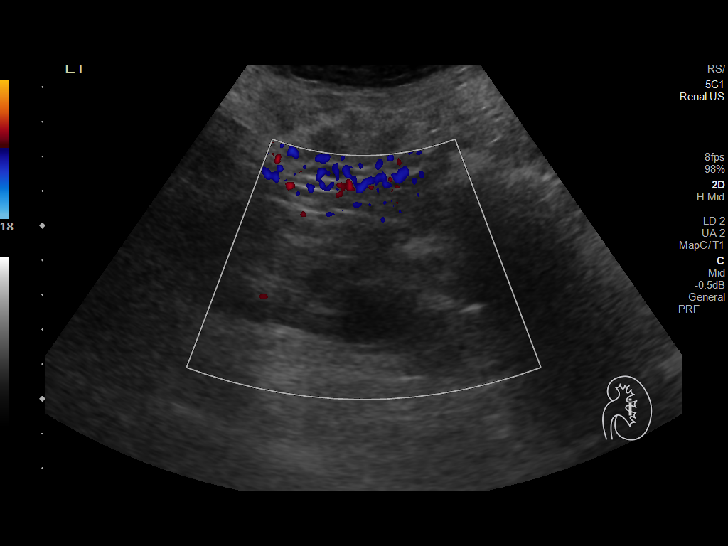
[im 35/47]
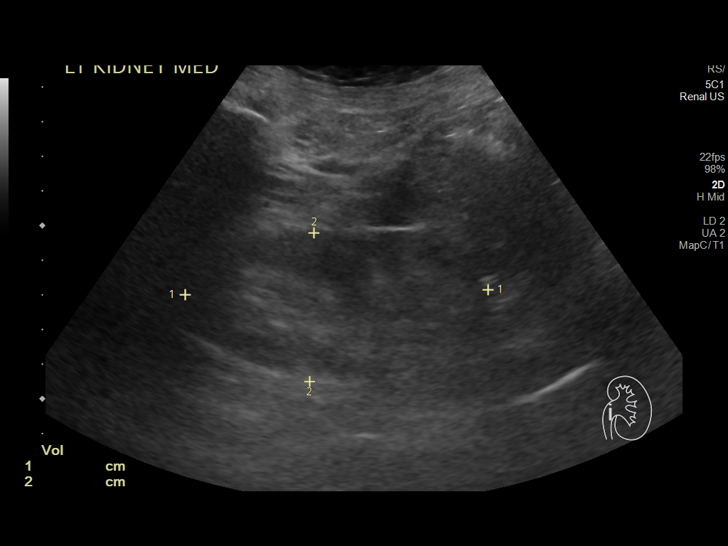
[im 39/47]
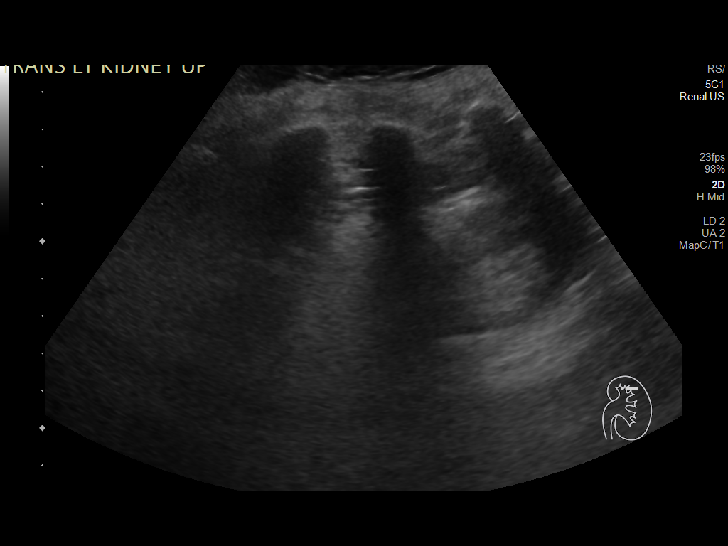
[im 43/47]
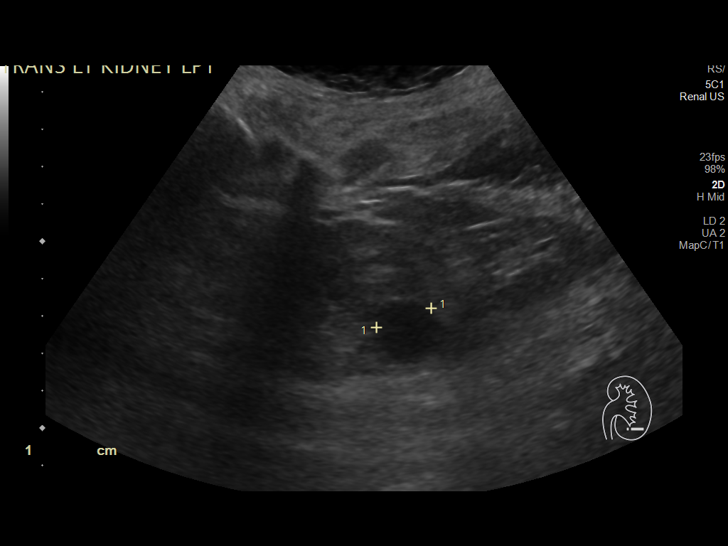
[im 47/47]
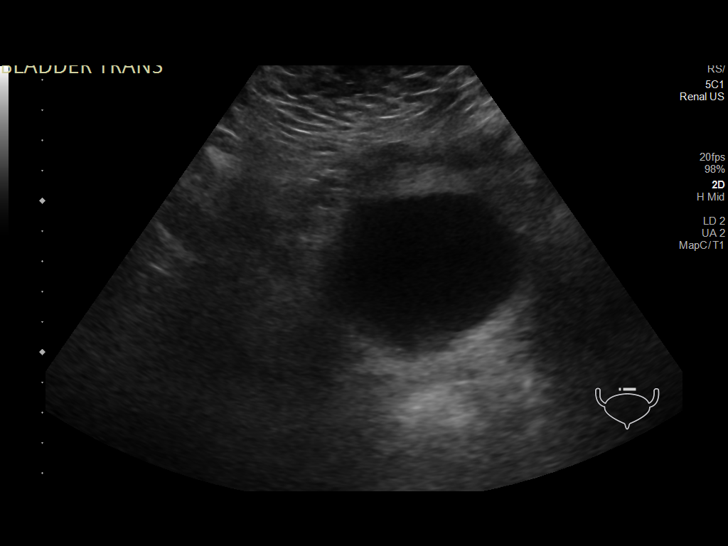

[14 of 25 positions shown; findings below may reference images not displayed]

FINDINGS: Right Kidney:

Renal measurements: 7.7 x 4.4 x 5.1 cm = volume: 91 mL. Right upper
pole cyst 4.8 cm. Right upper pole cyst 3.5 cm. Echogenicity within
normal limits. No mass or hydronephrosis visualized.

Left Kidney:

Renal measurements: 8.7 x 4.3 x 3.8 cm = volume: 74 mL. 2 cm left
lower pole cyst. Echogenicity within normal limits. No mass or
hydronephrosis visualized.

Bladder:

Appears normal for degree of bladder distention.

Other:

Suboptimal study due to body habitus.
IMPRESSION: Relatively small kidneys bilaterally. Negative for hydronephrosis.
Bilateral renal cysts.

## 2022-11-11 DEATH — deceased
